# Patient Record
Sex: Female | Born: 1935 | Race: White | Hispanic: No | Marital: Married | Smoking: Never smoker
Health system: Southern US, Community
[De-identification: ages and names within clinical notes are randomized; demographics above are authoritative.]

## PROBLEM LIST (undated history)

## (undated) DIAGNOSIS — E119 Type 2 diabetes mellitus without complications: Secondary | ICD-10-CM

## (undated) DIAGNOSIS — I10 Essential (primary) hypertension: Secondary | ICD-10-CM

## (undated) DIAGNOSIS — M810 Age-related osteoporosis without current pathological fracture: Secondary | ICD-10-CM

## (undated) DIAGNOSIS — S62109A Fracture of unspecified carpal bone, unspecified wrist, initial encounter for closed fracture: Secondary | ICD-10-CM

## (undated) DIAGNOSIS — R269 Unspecified abnormalities of gait and mobility: Secondary | ICD-10-CM

## (undated) DIAGNOSIS — C50919 Malignant neoplasm of unspecified site of unspecified female breast: Secondary | ICD-10-CM

## (undated) DIAGNOSIS — G459 Transient cerebral ischemic attack, unspecified: Secondary | ICD-10-CM

## (undated) DIAGNOSIS — F329 Major depressive disorder, single episode, unspecified: Secondary | ICD-10-CM

## (undated) DIAGNOSIS — Z975 Presence of (intrauterine) contraceptive device: Secondary | ICD-10-CM

## (undated) DIAGNOSIS — F0390 Unspecified dementia without behavioral disturbance: Secondary | ICD-10-CM

## (undated) DIAGNOSIS — R413 Other amnesia: Principal | ICD-10-CM

## (undated) DIAGNOSIS — I4891 Unspecified atrial fibrillation: Secondary | ICD-10-CM

## (undated) DIAGNOSIS — E7800 Pure hypercholesterolemia, unspecified: Secondary | ICD-10-CM

## (undated) DIAGNOSIS — F32A Depression, unspecified: Secondary | ICD-10-CM

## (undated) HISTORY — DX: Fracture of unspecified carpal bone, unspecified wrist, initial encounter for closed fracture: S62.109A

## (undated) HISTORY — DX: Essential (primary) hypertension: I10

## (undated) HISTORY — DX: Type 2 diabetes mellitus without complications: E11.9

## (undated) HISTORY — DX: Other amnesia: R41.3

## (undated) HISTORY — DX: Major depressive disorder, single episode, unspecified: F32.9

## (undated) HISTORY — DX: Depression, unspecified: F32.A

## (undated) HISTORY — DX: Unspecified abnormalities of gait and mobility: R26.9

## (undated) HISTORY — DX: Pure hypercholesterolemia, unspecified: E78.00

## (undated) HISTORY — DX: Presence of (intrauterine) contraceptive device: Z97.5

## (undated) HISTORY — DX: Age-related osteoporosis without current pathological fracture: M81.0

## (undated) HISTORY — DX: Malignant neoplasm of unspecified site of unspecified female breast: C50.919

## (undated) HISTORY — PX: MASTECTOMY MODIFIED RADICAL: SUR848

---

## 1978-02-20 DIAGNOSIS — C50919 Malignant neoplasm of unspecified site of unspecified female breast: Secondary | ICD-10-CM

## 1978-02-20 HISTORY — DX: Malignant neoplasm of unspecified site of unspecified female breast: C50.919

## 2001-06-03 ENCOUNTER — Encounter: Payer: Self-pay | Admitting: Family Medicine

## 2001-06-03 ENCOUNTER — Encounter: Admission: RE | Admit: 2001-06-03 | Discharge: 2001-06-03 | Payer: Self-pay | Admitting: Family Medicine

## 2004-04-13 ENCOUNTER — Encounter: Admission: RE | Admit: 2004-04-13 | Discharge: 2004-04-13 | Payer: Self-pay | Admitting: Family Medicine

## 2011-05-22 DIAGNOSIS — S62109A Fracture of unspecified carpal bone, unspecified wrist, initial encounter for closed fracture: Secondary | ICD-10-CM

## 2011-05-22 HISTORY — DX: Fracture of unspecified carpal bone, unspecified wrist, initial encounter for closed fracture: S62.109A

## 2011-06-16 ENCOUNTER — Ambulatory Visit
Admission: RE | Admit: 2011-06-16 | Discharge: 2011-06-16 | Disposition: A | Discharge status: Home / Self Care | Payer: Medicare Other | Source: Ambulatory Visit | Attending: Family Medicine | Admitting: Family Medicine

## 2011-06-16 ENCOUNTER — Other Ambulatory Visit: Payer: Self-pay | Admitting: Family Medicine

## 2011-06-16 DIAGNOSIS — S6990XA Unspecified injury of unspecified wrist, hand and finger(s), initial encounter: Secondary | ICD-10-CM

## 2013-04-11 ENCOUNTER — Other Ambulatory Visit: Payer: Self-pay | Admitting: Family Medicine

## 2013-04-11 DIAGNOSIS — R413 Other amnesia: Secondary | ICD-10-CM

## 2013-04-20 ENCOUNTER — Ambulatory Visit
Admission: RE | Admit: 2013-04-20 | Discharge: 2013-04-20 | Disposition: A | Discharge status: Home / Self Care | Payer: 59 | Source: Ambulatory Visit | Attending: Family Medicine | Admitting: Family Medicine

## 2013-04-20 DIAGNOSIS — R413 Other amnesia: Secondary | ICD-10-CM

## 2013-04-20 MED ORDER — GADOBENATE DIMEGLUMINE 529 MG/ML IV SOLN
11.0000 mL | Freq: Once | INTRAVENOUS | Status: AC | PRN
  Administered 2013-04-20: 11 mL via INTRAVENOUS

## 2013-05-14 ENCOUNTER — Encounter: Payer: Self-pay | Admitting: Neurology

## 2013-05-15 ENCOUNTER — Ambulatory Visit (INDEPENDENT_AMBULATORY_CARE_PROVIDER_SITE_OTHER): Payer: Medicare Other | Admitting: Neurology

## 2013-05-15 ENCOUNTER — Encounter: Payer: Self-pay | Admitting: Neurology

## 2013-05-15 ENCOUNTER — Encounter (INDEPENDENT_AMBULATORY_CARE_PROVIDER_SITE_OTHER): Payer: Self-pay

## 2013-05-15 VITALS — BP 143/74 | HR 116 | Ht 63.5 in | Wt 116.0 lb

## 2013-05-15 DIAGNOSIS — E0590 Thyrotoxicosis, unspecified without thyrotoxic crisis or storm: Secondary | ICD-10-CM

## 2013-05-15 DIAGNOSIS — R634 Abnormal weight loss: Secondary | ICD-10-CM

## 2013-05-15 DIAGNOSIS — R269 Unspecified abnormalities of gait and mobility: Secondary | ICD-10-CM

## 2013-05-15 DIAGNOSIS — R413 Other amnesia: Secondary | ICD-10-CM

## 2013-05-15 HISTORY — DX: Other amnesia: R41.3

## 2013-05-15 HISTORY — DX: Unspecified abnormalities of gait and mobility: R26.9

## 2013-05-15 NOTE — Progress Notes (Signed)
Reason for visit: Memory disorder  Brittney Powell is a 80 y.o. female  History of present illness:  Brittney Powell is an 80 year old right-handed white female with a history of a progressive memory disorder. The patient's husband indicates that there were no indications of any memory problems whatsoever until the summer of 2014. Particularly since September 2014, the patient has had a significant progression in her memory. The patient has difficulty with short-term memory, and remembering names for people. The patient does not operate a motor vehicle, and she has not driven in 2 or 3 years, but not secondary to memory problems. The patient does not do the finances. The patient has not been cooking, mainly because of a wrist fracture on the left. The patient however, has also concurrently developed problems with balance, and falls. The patient has had weight loss, losing about 78 pounds of weight since the summer of 2015. The patient is sleeping fairly well, and she denies any headaches or focal numbness or weakness of the extremities. The patient has some occasional urinary incontinence. The patient has been placed on Aricept within the last 3 weeks, and she is just now going on Namenda. Blood work has been obtained that includes a vitamin B12 level, and a TSH is in the low normal range. The patient has had MRI evaluation of the brain that shows a mild to moderate level of small vessel ischemic changes that are chronic. Some generalized atrophy is seen.  Past Medical History  Diagnosis Date  . DM (diabetes mellitus)   . HTN (hypertension)   . Hypercholesterolemia   . Depression   . Breast cancer 1980    right  . Intrauterine pessary   . Wrist fracture 05/2011  . Osteoporosis   . Memory difficulty 05/15/2013  . Abnormality of gait 05/15/2013    Past Surgical History  Procedure Laterality Date  . Mastectomy modified radical Right     30 years ago    Family History  Problem Relation Age of  Onset  . Diabetes Father   . Cancer Father     leukemia  . Heart attack Brother   . Dementia Maternal Aunt     Social history:  reports that she has never smoked. She has never used smokeless tobacco. She reports that she drinks alcohol. She reports that she does not use illicit drugs.  Medications:  Current Outpatient Prescriptions on File Prior to Visit  Medication Sig Dispense Refill  . aspirin EC 81 MG tablet Take 81 mg by mouth daily.      [name] glimepiride (AMARYL) 4 MG tablet Take 4 mg by mouth daily with breakfast.      . lisinopril (PRINIVIL,ZESTRIL) 5 MG tablet Take 5 mg by mouth daily.      . Multiple Vitamins-Minerals (CENTRUM SILVER PO) Take 1 capsule by mouth 3 x daily with food.      [name] PARoxetine (PAXIL) 20 MG tablet Take 20 mg by mouth daily.      [name] atorvastatin (LIPITOR) 40 MG tablet Take 40 mg by mouth daily.      [name] ezetimibe (ZETIA) 10 MG tablet Take 10 mg by mouth daily.      [name] oxybutynin (DITROPAN) 5 MG tablet Take 5 mg by mouth 2 (two) times daily.       No current facility-administered medications on file prior to visit.      Allergies  Allergen Reactions  . Penicillins     ROS:  Out of a complete  14 system review of symptoms, the patient complains only of the following symptoms, and all other reviewed systems are negative.  Weight loss, fatigue Skin rash Urination problems Memory loss, confusion, weakness Anxiety, insomnia, decreased energy, change in appetite, disinterest in activities, hallucinations  Blood pressure 143/74, pulse 116, height 5' 3.5" (1.613 m), weight 116 lb (52.617 kg).  Physical Exam  General: The patient is alert and cooperative at the time of the examination.  Eyes: Pupils are equal, round, and reactive to light. Discs are flat bilaterally.  Neck: The neck is supple, no carotid bruits are noted.  Respiratory: The respiratory examination is clear.  Cardiovascular: The cardiovascular examination reveals an irregularly  irregular heart rhythm, no obvious murmurs or rubs are noted.  Skin: Extremities are without significant edema. There is a deformity involving the left wrist.  Neurologic Exam  Mental status: Mini-Mental status examination done today shows a total score of 19/30.  Cranial nerves: Facial symmetry is present. There is good sensation of the face to pinprick and soft touch bilaterally. The strength of the facial muscles and the muscles to head turning and shoulder shrug are normal bilaterally. Speech is well enunciated, no aphasia or dysarthria is noted. Extraocular movements are full. Visual fields are full. The tongue is midline, and the patient has symmetric elevation of the soft palate. No obvious hearing deficits are noted.  Motor: The motor testing reveals 5 over 5 strength of all 4 extremities. Good symmetric motor tone is noted throughout.  Sensory: Sensory testing is intact to pinprick, soft touch, vibration sensation, and position sense on all 4 extremities, with the exception that there is a significant impairment in vibration and position sense in both feet. No evidence of extinction is noted.  Coordination: Cerebellar testing reveals good finger-nose-finger and heel-to-shin bilaterally.  Gait and station: Gait is slightly wide-based. Tandem gait is poor. Romberg is negative, but is unsteady. No drift is seen.  Reflexes: Deep tendon reflexes are symmetric and normal bilaterally, with the exception that the ankle jerk reflexes are depressed bilaterally. Toes are downgoing bilaterally.   MRI brain February 20th 2015:   IMPRESSION:  1. Moderate generalized atrophy demonstrates a slight prominence in  the temporal and parietal lobes. This is nonspecific, but can be  seen in the setting of Alzheimer's dementia.  2. No acute intracranial abnormality.  3. Moderate periventricular and subcortical white matter disease  bilaterally. This is nonspecific, but likely reflects the sequela of    chronic microvascular ischemia.   Assessment/Plan:  One. Progressive memory disturbance  2. Progressive gait disturbance  3. Weight loss  The patient has had an issue with a fairly rapid change in memory associated with balance issues and weight loss. The history suggests that there is a process at work that is likely not Alzheimer's disease. The patient will need to be evaluated for other issues such as a thyroid encephalopathy or a paraneoplastic encephalopathy. The patient will undergo further blood work. If the blood studies are unremarkable, I would recommend lumbar puncture evaluation. The patient will followup in 3 or 4 months, but I will be contacting the patient concerning the blood work when the studies are available. Clinical examination today shows an irregularly irregular heartbeat that could represent atrial fibrillation. The patient may require further evaluation for this.  Jill Alexanders MD 05/15/2013 7:50 PM  [facility] [address] [place], [place] [address]  Phone [phone] Fax [phone]

## 2013-05-15 NOTE — Patient Instructions (Signed)

## 2013-05-21 ENCOUNTER — Telehealth: Payer: Self-pay | Admitting: Neurology

## 2013-05-21 LAB — HIV ANTIBODY (ROUTINE TESTING W REFLEX): HIV 1/HIV 2 AB: NONREACTIVE

## 2013-05-21 LAB — THYROGLOBULIN ANTIBODY: Thyroglobulin Ab: <1 IU/mL (ref 0.0–0.9)

## 2013-05-21 LAB — PARANEOPLASTIC PROFILE 1

## 2013-05-21 LAB — COPPER, SERUM: Copper: 110 ug/dL (ref 72–166)

## 2013-05-21 LAB — THYROID PEROXIDASE ANTIBODY: Thyroid Peroxidase Ab: <6 IU/mL (ref 0–34)

## 2013-05-21 LAB — T3, FREE: T3 FREE: 3 pg/mL (ref 2.0–4.4)

## 2013-05-21 LAB — T4, FREE: Free T4: 0.93 ng/dL (ref 0.82–1.77)

## 2013-05-21 LAB — RPR: RPR: NONREACTIVE

## 2013-05-21 LAB — SEDIMENTATION RATE: SED RATE: 6 mm/h (ref 0–40)

## 2013-05-21 LAB — ANA W/REFLEX: Anti Nuclear Antibody(ANA): NEGATIVE

## 2013-05-21 NOTE — Telephone Encounter (Signed)
I called the patient, I talked with her husband. The blood work was completely normal. As we discussed in the office, I would recommend pursuing a lumbar puncture to look for an inflammatory process within the spinal fluid, exclude neoplasm. The husband will discuss this issue with the patient, and they'll let he know whether they want to do the spinal tap.

## 2013-06-04 ENCOUNTER — Telehealth: Payer: Self-pay | Admitting: Neurology

## 2013-06-04 NOTE — Telephone Encounter (Signed)
Patient's husband walked into the lobby stating that the patient saw Dr. Brigitte Pulse recently who wants the patient to return to Dr. Jannifer Franklin for medications (Aricept and Namenda). Husband also has questions regarding stopping Zetia and Lipitor. Butch Penny is aware and will call him back at   [phone]

## 2013-06-05 MED ORDER — MEMANTINE HCL ER 28 MG PO CP24: 28.0000 mg | ORAL_CAPSULE | Freq: Every day | ORAL | Status: DC

## 2013-06-05 MED ORDER — DONEPEZIL HCL 5 MG PO TABS: 5.0000 mg | ORAL_TABLET | Freq: Every day | ORAL | Status: DC

## 2013-06-05 NOTE — Telephone Encounter (Signed)
Called pt and spoke with pt's husband [name] concerning the pt's medication. Husband states that the pt needs refills on Aricept and Namenda, states that pt is on her last week of Namenda. Husband also states that Dr. Brigitte Pulse stopped the pt from taking Zetia and Lipitor, because the pt was levels were fine and husband wanted to know if Dr. Jannifer Franklin thought the pt should continue to take them. Husband would like Dr. Jannifer Franklin to give him a call back please. Thanks

## 2013-06-05 NOTE — Telephone Encounter (Signed)
I called the patient and I talked with her husband. The patient is stabilized with her weight, and her walking seems a bit that her. The husband does not wish to pursue a lumbar puncture this time. I will call in a prescription for the Namenda, would keep Aricept at 5 mg daily until it is definite that the weight is not continuing to decline. The patient was taken off of Lipitor and Zetia. Stay off the Lipitor is reasonable, I have no problem with going back on Zetia.

## 2013-06-25 ENCOUNTER — Telehealth: Payer: Self-pay | Admitting: Neurology

## 2013-06-25 NOTE — Telephone Encounter (Signed)
Patient's husband calling to ask more information about lumbar puncture that was discussed for patient. Please call and advise.   

## 2013-06-26 NOTE — Telephone Encounter (Signed)
I called the patient, talked with her husband. I went over the potential indications for the lumbar puncture. The husband and his wife will try to decide about this procedure in the near future, and let me know. I'll be happy to set it up if they desire. The husband indicates that his wife has very low energy levels, unable to do much.

## 2013-06-26 NOTE — Telephone Encounter (Signed)
Patient's husband calling to ask more information about lumbar puncture that was discussed for patient. Please call and advise.

## 2013-06-30 ENCOUNTER — Telehealth: Payer: Self-pay | Admitting: *Deleted

## 2013-06-30 DIAGNOSIS — R269 Unspecified abnormalities of gait and mobility: Secondary | ICD-10-CM

## 2013-06-30 DIAGNOSIS — R413 Other amnesia: Secondary | ICD-10-CM

## 2013-07-02 NOTE — Telephone Encounter (Signed)
I called the husband. He now agrees to have a lumbar puncture done, I'll try to get this set up.

## 2013-07-02 NOTE — Telephone Encounter (Signed)
Called pt and spoke with pt's husband [name] and he stated that the pt would like to go ahead and have a LP done, based on phone note on 06/25/13. Please advise

## 2013-07-04 ENCOUNTER — Telehealth: Payer: Self-pay | Admitting: Neurology

## 2013-07-04 NOTE — Telephone Encounter (Signed)
Pt's husband [name] called back asking who would be calling him back concerning the schedule for LP, Spoke with Amiee and found where it was ordered and that GI would be calling pt to schedule this apt. Called [name] back and advised this order was placed and that GI will be calling to schedule the LP. Thanks

## 2013-07-22 ENCOUNTER — Telehealth: Payer: Self-pay | Admitting: Neurology

## 2013-07-22 NOTE — Telephone Encounter (Signed)
Patient's husband called stating that he has called [facility] and they have not received any orders for his wife.. Patient's husband would like a call back regarding this. Please call to advise.

## 2013-07-22 NOTE — Telephone Encounter (Signed)
Informed patient that Brittney Powell [facility]) had been contacted and she will be calling him to schedule appt, he verbalized understanding.

## 2013-07-23 ENCOUNTER — Other Ambulatory Visit: Payer: Self-pay | Admitting: Neurology

## 2013-07-23 DIAGNOSIS — R413 Other amnesia: Secondary | ICD-10-CM

## 2013-07-28 ENCOUNTER — Ambulatory Visit
Admission: RE | Admit: 2013-07-28 | Discharge: 2013-07-28 | Disposition: A | Discharge status: Home / Self Care | Payer: 59 | Source: Ambulatory Visit | Attending: Neurology | Admitting: Neurology

## 2013-07-28 ENCOUNTER — Other Ambulatory Visit (HOSPITAL_COMMUNITY)
Admission: RE | Admit: 2013-07-28 | Discharge: 2013-07-28 | Disposition: A | Discharge status: Home / Self Care | Payer: 59 | Source: Ambulatory Visit | Attending: Neurology | Admitting: Neurology

## 2013-07-28 ENCOUNTER — Other Ambulatory Visit: Payer: Self-pay | Admitting: Neurology

## 2013-07-28 VITALS — BP 145/101 | HR 118

## 2013-07-28 DIAGNOSIS — R634 Abnormal weight loss: Secondary | ICD-10-CM | POA: Insufficient documentation

## 2013-07-28 DIAGNOSIS — R269 Unspecified abnormalities of gait and mobility: Secondary | ICD-10-CM

## 2013-07-28 DIAGNOSIS — R413 Other amnesia: Secondary | ICD-10-CM

## 2013-07-28 LAB — CRYPTOCOCCAL ANTIGEN, CSF: Crypto Ag: NEGATIVE

## 2013-07-28 NOTE — Discharge Instructions (Signed)

## 2013-07-28 NOTE — Progress Notes (Signed)
Blood drawn from right AC to go with spinal fluid. Site is unremarkable and pt tolerated well. Discharge explained to pt and her family.

## 2013-07-30 LAB — CSF PANEL 1
GLUCOSE CSF: 76 mg/dL (ref 43–76)
RBC Count, CSF: 0 cu mm
Total Protein, CSF: 56 mg/dL — ABNORMAL HIGH (ref 15–45)
Tube #: 3
VDRL Quant, CSF: NONREACTIVE
WBC, CSF: 1 cu mm (ref 0–5)

## 2013-08-07 LAB — B. BURGDORFI ANTIBODIES, CSF: Lyme Ab: NEGATIVE

## 2013-08-08 ENCOUNTER — Telehealth: Payer: Self-pay | Admitting: Neurology

## 2013-08-08 NOTE — Telephone Encounter (Signed)
Spouse calling to get lumbar puncture results.  Please call and advise.

## 2013-08-08 NOTE — Telephone Encounter (Signed)
Informed patient that results have not be reviewed by physician, will call back once reviewed, he verbalized understanding

## 2013-08-11 ENCOUNTER — Telehealth: Payer: Self-pay | Admitting: Neurology

## 2013-08-11 NOTE — Telephone Encounter (Signed)
I called patient, talked with the husband. The spinal fluid analysis so far is unremarkable with the exception that the protein level slightly elevated. The patient remained somewhat weak and unstable with walking. We will consider home health physical therapy if they are amenable to this, the husband will contact our office to let him know.

## 2013-09-16 ENCOUNTER — Ambulatory Visit (INDEPENDENT_AMBULATORY_CARE_PROVIDER_SITE_OTHER): Payer: Medicare Other | Admitting: Neurology

## 2013-09-16 ENCOUNTER — Encounter: Payer: Self-pay | Admitting: Neurology

## 2013-09-16 VITALS — BP 144/79 | HR 64 | Wt 127.0 lb

## 2013-09-16 DIAGNOSIS — R413 Other amnesia: Secondary | ICD-10-CM

## 2013-09-16 DIAGNOSIS — R269 Unspecified abnormalities of gait and mobility: Secondary | ICD-10-CM

## 2013-09-16 MED ORDER — DONEPEZIL HCL 10 MG PO TABS: 10.0000 mg | ORAL_TABLET | Freq: Every day | ORAL | Status: DC

## 2013-09-16 NOTE — Patient Instructions (Signed)

## 2013-09-16 NOTE — Progress Notes (Signed)
Reason for visit: Dementia  Brittney Powell is an 80 y.o. female  History of present illness:  Brittney Powell is an 80 year old right-handed white female with a history of onset of a relatively rapidly progressive dementia. The issue started in September 2014. The patient also had an associated problem with gait instability, but she has not fallen recently. The patient has been placed on Aricept and Namenda. She did have an associated problem with weight loss, but this seems to have stabilized, and the patient has gained 11 pounds since last seen. The patient has undergone blood work and spinal fluid evaluation without any significant abnormalities. The protein level in the spinal fluid was slightly elevated. Cytology was negative. A paraneoplastic antibody panel was negative. Since last seen, the husband indicates that the patient has had difficulty knowing how to operate a cell phone and how to operate a remote control for the TV. Bowel control seems to be unaffected. The patient is sleeping well at night. The patient has had transient skin rashes affecting the neck, face, and arm.   Past Medical History  Diagnosis Date  . DM (diabetes mellitus)   . HTN (hypertension)   . Hypercholesterolemia   . Depression   . Breast cancer 1980    right  . Intrauterine pessary   . Wrist fracture 05/2011  . Osteoporosis   . Memory difficulty 05/15/2013  . Abnormality of gait 05/15/2013    Past Surgical History  Procedure Laterality Date  . Mastectomy modified radical Right     30 years ago    Family History  Problem Relation Age of Onset  . Diabetes Father   . Cancer Father     leukemia  . Heart attack Brother   . Dementia Maternal Aunt     Social history:  reports that she has never smoked. She has never used smokeless tobacco. She reports that she drinks alcohol. She reports that she does not use illicit drugs.    Allergies  Allergen Reactions  . Penicillins     Medications:    Current Outpatient Prescriptions on File Prior to Visit  Medication Sig Dispense Refill  . aspirin EC 81 MG tablet Take 81 mg by mouth daily.      . Calcium-Magnesium-Zinc 167-83-8 MG TABS Take 1 tablet by mouth daily.      [name] ezetimibe (ZETIA) 10 MG tablet Take 10 mg by mouth daily.      [name] glimepiride (AMARYL) 4 MG tablet Take 4 mg by mouth daily with breakfast.      . lisinopril (PRINIVIL,ZESTRIL) 5 MG tablet Take 5 mg by mouth daily.      . Memantine HCl ER 28 MG CP24 Take 28 mg by mouth daily.  90 capsule  1  . Multiple Vitamins-Minerals (CENTRUM SILVER PO) Take 1 capsule by mouth 3 x daily with food.      [name] oxybutynin (DITROPAN) 5 MG tablet Take 5 mg by mouth 2 (two) times daily.      [name] PARoxetine (PAXIL) 20 MG tablet Take 20 mg by mouth daily.      . vitamin B-12 (CYANOCOBALAMIN) 1000 MCG tablet Take 1,000 mcg by mouth daily.       No current facility-administered medications on file prior to visit.    ROS:  Out of a complete 14 system review of symptoms, the patient complains only of the following symptoms, and all other reviewed systems are negative.  Incontinence of bladder Gait instability Memory loss Weakness Confusion, anxiety  Daytime sleepiness, sleep talking Skin rash  Blood pressure 144/79, pulse 64, weight 127 lb (57.607 kg).  Physical Exam  General: The patient is alert and cooperative at the time of the examination.  Skin: No significant peripheral edema is noted.   Neurologic Exam  Mental status: The Mini-Mental status examination done today shows a total score of 19/30.  Cranial nerves: Facial symmetry is present. Speech is normal, no aphasia or dysarthria is noted. Extraocular movements are full. Visual fields are full.  Motor: The patient has good strength in all 4 extremities.  Sensory examination: Soft touch sensation is symmetric on the face, arms, and legs.  Coordination: The patient has good finger-nose-finger and heel-to-shin bilaterally.  The patient has apraxia with the use of the lower extremities.  Gait and station: The patient has a slightly wide-based, unsteady gait. Tandem gait is unsteady. Romberg is negative , but is unsteady. No drift is seen.  Reflexes: Deep tendon reflexes are symmetric.     MRI brain 04/20/13:  IMPRESSION:  1. Moderate generalized atrophy demonstrates a slight prominence in  the temporal and parietal lobes. This is nonspecific, but can be  seen in the setting of Alzheimer's dementia.  2. No acute intracranial abnormality.  3. Moderate periventricular and subcortical white matter disease  bilaterally. This is nonspecific, but likely reflects the sequela of  chronic microvascular ischemia.    Assessment/Plan:  1. Memory disorder  2. Gait disorder  The patient had a workup that was unrevealing as to the etiology of the gait disorder and dementia. The patient will continue on the Aricept and Namenda. Her cognitive functioning level seems to have stabilized since last seen. The weight has returned, and the patient is eating well. She will be increased on Aricept taking 10 mg at night, and she will followup in about 5 to 6 months.   Jill Alexanders MD 09/16/2013 7:13 PM  [facility] [address] [place], [place] [address]  Phone [phone] Fax [phone]

## 2013-09-18 ENCOUNTER — Ambulatory Visit: Payer: Self-pay | Admitting: Neurology

## 2013-10-16 ENCOUNTER — Telehealth: Payer: Self-pay | Admitting: Neurology

## 2013-10-16 DIAGNOSIS — R269 Unspecified abnormalities of gait and mobility: Secondary | ICD-10-CM

## 2013-10-16 NOTE — Telephone Encounter (Signed)
I called the patient, talked with the husband. The patient has had ongoing issues with her walking, and she requires assistance for ambulation. I'll get home health physical therapy.

## 2013-10-16 NOTE — Telephone Encounter (Signed)
Spouse wants to proceed with in home PT.  Please call and advise anytime and may leave a detailed message on voice mail.

## 2013-10-16 NOTE — Telephone Encounter (Signed)
Spouse wants to proceed with in home PT. Please call and advise anytime and may leave a detailed message on voice mail.

## 2013-10-22 ENCOUNTER — Telehealth: Payer: Self-pay | Admitting: *Deleted

## 2013-10-22 NOTE — Telephone Encounter (Signed)
Called patient to see if [facility] had contacted them. [place] was actually in the home (presently).

## 2013-11-03 ENCOUNTER — Telehealth: Payer: Self-pay | Admitting: Neurology

## 2013-11-03 NOTE — Telephone Encounter (Signed)
Events noted, cannot force accept therapy.

## 2013-11-03 NOTE — Telephone Encounter (Signed)
Ilda Basset, Physical Therapist at [facility] @ [phone]. Wanted to make Dr. Jannifer Franklin aware that spouse has cancelled all home health therapy. Spouse stated pt gets very anxious and upset when someone comes into her home and make her do things she doesn't want to. FYI

## 2013-11-03 NOTE — Telephone Encounter (Signed)
Ilda Basset, Physical Therapist at [facility] @ [phone].  Wanted to make Dr. Jannifer Franklin aware that spouse has cancelled all home health therapy.  Spouse stated pt gets very anxious and upset when someone comes into her home and make her do things she doesn't want to.  FYI

## 2013-11-25 ENCOUNTER — Other Ambulatory Visit: Payer: Self-pay | Admitting: Neurology

## 2014-01-07 ENCOUNTER — Encounter: Payer: Self-pay | Admitting: Neurology

## 2014-01-13 ENCOUNTER — Encounter: Payer: Self-pay | Admitting: Neurology

## 2014-03-23 ENCOUNTER — Encounter: Payer: Self-pay | Admitting: Adult Health

## 2014-03-23 ENCOUNTER — Ambulatory Visit: Payer: Medicare Other | Admitting: Adult Health

## 2014-03-23 ENCOUNTER — Ambulatory Visit (INDEPENDENT_AMBULATORY_CARE_PROVIDER_SITE_OTHER): Payer: Medicare Other | Admitting: Adult Health

## 2014-03-23 VITALS — BP 128/84 | HR 67 | Ht 64.0 in | Wt 121.0 lb

## 2014-03-23 DIAGNOSIS — R269 Unspecified abnormalities of gait and mobility: Secondary | ICD-10-CM

## 2014-03-23 DIAGNOSIS — R413 Other amnesia: Secondary | ICD-10-CM

## 2014-03-23 NOTE — Progress Notes (Signed)
I have read the note, and I agree with the clinical assessment and plan.  Maeghan Canny KEITH   

## 2014-03-23 NOTE — Progress Notes (Signed)
PATIENT: Brittney Powell DOB: 1928-01-26  REASON FOR VISIT: follow up- memory loss, gait disorder HISTORY FROM: Husband  HISTORY OF PRESENT ILLNESS:  Brittney Powell is an 81 year old female with a history of rapidly progressive dementia. She returns today for follow-up. She is currently taking Aricept and Namenda. Patient also has a gait disorder- Physical therapy was recommended but people coming into the patient's home caused anxiety.  Denies any falls since the last visit but has had some stumbles. Husband states that her memory has gotten worse. She needs assistance with ADLS. She does not operate a motor vehicle. Husband does all the cooking and fiances. Patient will get very anxious and that will cause some agitation. Denies any aggression. Patient is sleeping well at night according to the husband. He does states that she may move her hands or reach for things while sleeping.  No new medical issues since last seen.   HISTORY 09/16/13 ([name]): Brittney Powell is an 81 year old right-handed white female with a history of onset of a relatively rapidly progressive dementia. The issue started in September 2014. The patient also had an associated problem with gait instability, but she has not fallen recently. The patient has been placed on Aricept and Namenda. She did have an associated problem with weight loss, but this seems to have stabilized, and the patient has gained 11 pounds since last seen. The patient has undergone blood work and spinal fluid evaluation without any significant abnormalities. The protein level in the spinal fluid was slightly elevated. Cytology was negative. A paraneoplastic antibody panel was negative. Since last seen, the husband indicates that the patient has had difficulty knowing how to operate a cell phone and how to operate a remote control for the TV. Bowel control seems to be unaffected. The patient is sleeping well at night. The patient has had transient skin rashes affecting  the neck, face, and arm.  REVIEW OF SYSTEMS: Out of a complete 14 system review of symptoms, the patient complains only of the following symptoms, and all other reviewed systems are negative.  Blurred vision, memory loss, dizziness, weakness, joint pain, back pain, walking difficulty, confusion, nervous/anxious, daytime sleepiness, snoring, sleep talking, acting out dreams.  ALLERGIES: Allergies  Allergen Reactions  . Penicillins     HOME MEDICATIONS: Outpatient Prescriptions Prior to Visit  Medication Sig Dispense Refill  . aspirin EC 81 MG tablet Take 81 mg by mouth daily.    . Calcium-Magnesium-Zinc 167-83-8 MG TABS Take 1 tablet by mouth daily.    [name] donepezil (ARICEPT) 10 MG tablet Take 1 tablet (10 mg total) by mouth at bedtime. 90 tablet 3  . ezetimibe (ZETIA) 10 MG tablet Take 10 mg by mouth daily.    [name] glimepiride (AMARYL) 4 MG tablet Take 4 mg by mouth daily with breakfast.    . lisinopril (PRINIVIL,ZESTRIL) 5 MG tablet Take 5 mg by mouth daily.    [name] NAMENDA XR 28 MG CP24 TAKE 1 CAPSULE DAILY 90 capsule 1  . oxybutynin (DITROPAN) 5 MG tablet Take 5 mg by mouth 2 (two) times daily.    [name] PARoxetine (PAXIL) 20 MG tablet Take 20 mg by mouth daily.    . vitamin B-12 (CYANOCOBALAMIN) 1000 MCG tablet Take 1,000 mcg by mouth daily.    . Multiple Vitamins-Minerals (CENTRUM SILVER PO) Take 1 capsule by mouth 3 x daily with food.    [name] MYRBETRIQ 25 MG TB24 tablet Take 25 mg by mouth daily.     No  facility-administered medications prior to visit.    PAST MEDICAL HISTORY: Past Medical History  Diagnosis Date  . DM (diabetes mellitus)   . HTN (hypertension)   . Hypercholesterolemia   . Depression   . Breast cancer 1980    right  . Intrauterine pessary   . Wrist fracture 05/2011  . Osteoporosis   . Memory difficulty 05/15/2013  . Abnormality of gait 05/15/2013    PAST SURGICAL HISTORY: Past Surgical History  Procedure Laterality Date  . Mastectomy modified radical Right      30 years ago    FAMILY HISTORY: Family History  Problem Relation Age of Onset  . Diabetes Father   . Cancer Father     leukemia  . Heart attack Brother   . Dementia Maternal Aunt     PHYSICAL EXAM  Filed Vitals:   03/23/14 1122  BP: 128/84  Pulse: 67  Height: 5\' 4"  (1.626 m)  Weight: 121 lb (54.885 kg)   Body mass index is 20.76 kg/(m^2).  Generalized: Well developed, in no acute distress   Neurological examination  Mentation: Alert. Follows all commands speech and language fluent. MMSE 16/30 Cranial nerve II-XII: Pupils were equal round reactive to light. Extraocular movements were full, visual field were full on confrontational test. Facial sensation and strength were normal. Uvula tongue midline. Head turning and shoulder shrug  were normal and symmetric. Motor: The motor testing reveals 5 over 5 strength of all 4 extremities. Good symmetric motor tone is noted throughout.  Sensory: Sensory testing is intact to soft touch on all 4 extremities. No evidence of extinction is noted.  Coordination: Cerebellar testing reveals mild difficulty with finger-nose-finger and heel-to-shin bilaterally.  Gait and station: Gait is normal. Tandem gait is normal. Romberg is negative. No drift is seen.  Reflexes: Deep tendon reflexes are symmetric and normal bilaterally.  [name]   DIAGNOSTIC DATA (LABS, IMAGING, TESTING) - I reviewed patient records, labs, notes, testing and imaging myself where available.     ASSESSMENT AND PLAN 81 y.o. year old female  has a past medical history of DM (diabetes mellitus); HTN (hypertension); Hypercholesterolemia; Depression; Breast cancer (1980); Intrauterine pessary; Wrist fracture (05/2011); Osteoporosis; Memory difficulty (05/15/2013); and Abnormality of gait (05/15/2013). here with:  1. Memory loss 2. Gait disorder  Patient's memory score has decreased since the last visit. Her MMSE today is 16/30 was previously 19/30. She will continue Aricept and  Namenda. No refills needed today. I have encouraged the patient to use a walker when ambulating. She has refused physical therapy in the past. She will follow-up in 6 months or sooner if needed.   Ward Givens, MSN, NP-C 03/23/2014, 11:49 AM [facility] [address] [place], [place] [address] [phone]  Note: This document was prepared with digital dictation and possible smart phrase technology. Any transcriptional errors that result from this process are unintentional.

## 2014-03-23 NOTE — Patient Instructions (Signed)
Continue Namenda and Aricept. Please use a walker when ambulating.  If your symptoms worsen or you develop new symptoms please let us know.

## 2014-05-12 ENCOUNTER — Other Ambulatory Visit: Payer: Self-pay

## 2014-05-12 MED ORDER — MEMANTINE HCL ER 28 MG PO CP24: 28.0000 mg | ORAL_CAPSULE | Freq: Every day | ORAL | Status: AC

## 2014-08-25 ENCOUNTER — Encounter (HOSPITAL_COMMUNITY): Payer: Self-pay

## 2014-08-25 ENCOUNTER — Emergency Department (HOSPITAL_COMMUNITY): Payer: Medicare Other

## 2014-08-25 ENCOUNTER — Other Ambulatory Visit (HOSPITAL_COMMUNITY): Payer: Medicare Other

## 2014-08-25 ENCOUNTER — Inpatient Hospital Stay (HOSPITAL_COMMUNITY)
Admission: EM | Admit: 2014-08-25 | Discharge: 2014-08-30 | DRG: 480 | Disposition: A | Discharge status: Skilled Nursing Facility | Payer: Medicare Other | Attending: Internal Medicine | Admitting: Internal Medicine

## 2014-08-25 ENCOUNTER — Inpatient Hospital Stay (HOSPITAL_COMMUNITY): Payer: Medicare Other

## 2014-08-25 DIAGNOSIS — Z79899 Other long term (current) drug therapy: Secondary | ICD-10-CM

## 2014-08-25 DIAGNOSIS — E1165 Type 2 diabetes mellitus with hyperglycemia: Secondary | ICD-10-CM | POA: Diagnosis present

## 2014-08-25 DIAGNOSIS — Z853 Personal history of malignant neoplasm of breast: Secondary | ICD-10-CM

## 2014-08-25 DIAGNOSIS — I10 Essential (primary) hypertension: Secondary | ICD-10-CM | POA: Diagnosis present

## 2014-08-25 DIAGNOSIS — J9601 Acute respiratory failure with hypoxia: Secondary | ICD-10-CM | POA: Diagnosis not present

## 2014-08-25 DIAGNOSIS — R791 Abnormal coagulation profile: Secondary | ICD-10-CM | POA: Diagnosis not present

## 2014-08-25 DIAGNOSIS — F329 Major depressive disorder, single episode, unspecified: Secondary | ICD-10-CM | POA: Diagnosis present

## 2014-08-25 DIAGNOSIS — M810 Age-related osteoporosis without current pathological fracture: Secondary | ICD-10-CM | POA: Diagnosis present

## 2014-08-25 DIAGNOSIS — R3915 Urgency of urination: Secondary | ICD-10-CM | POA: Diagnosis present

## 2014-08-25 DIAGNOSIS — F0390 Unspecified dementia without behavioral disturbance: Secondary | ICD-10-CM | POA: Diagnosis present

## 2014-08-25 DIAGNOSIS — Z8249 Family history of ischemic heart disease and other diseases of the circulatory system: Secondary | ICD-10-CM

## 2014-08-25 DIAGNOSIS — E041 Nontoxic single thyroid nodule: Secondary | ICD-10-CM | POA: Diagnosis present

## 2014-08-25 DIAGNOSIS — K449 Diaphragmatic hernia without obstruction or gangrene: Secondary | ICD-10-CM | POA: Diagnosis present

## 2014-08-25 DIAGNOSIS — Z833 Family history of diabetes mellitus: Secondary | ICD-10-CM

## 2014-08-25 DIAGNOSIS — D62 Acute posthemorrhagic anemia: Secondary | ICD-10-CM | POA: Diagnosis not present

## 2014-08-25 DIAGNOSIS — E049 Nontoxic goiter, unspecified: Secondary | ICD-10-CM

## 2014-08-25 DIAGNOSIS — E785 Hyperlipidemia, unspecified: Secondary | ICD-10-CM | POA: Diagnosis present

## 2014-08-25 DIAGNOSIS — R54 Age-related physical debility: Secondary | ICD-10-CM | POA: Diagnosis present

## 2014-08-25 DIAGNOSIS — W19XXXA Unspecified fall, initial encounter: Secondary | ICD-10-CM | POA: Diagnosis present

## 2014-08-25 DIAGNOSIS — Z880 Allergy status to penicillin: Secondary | ICD-10-CM | POA: Diagnosis not present

## 2014-08-25 DIAGNOSIS — S72142A Displaced intertrochanteric fracture of left femur, initial encounter for closed fracture: Secondary | ICD-10-CM | POA: Diagnosis present

## 2014-08-25 DIAGNOSIS — R2681 Unsteadiness on feet: Secondary | ICD-10-CM | POA: Diagnosis present

## 2014-08-25 DIAGNOSIS — S7292XA Unspecified fracture of left femur, initial encounter for closed fracture: Secondary | ICD-10-CM

## 2014-08-25 DIAGNOSIS — Z7982 Long term (current) use of aspirin: Secondary | ICD-10-CM | POA: Diagnosis not present

## 2014-08-25 DIAGNOSIS — S72009A Fracture of unspecified part of neck of unspecified femur, initial encounter for closed fracture: Secondary | ICD-10-CM

## 2014-08-25 DIAGNOSIS — W1830XA Fall on same level, unspecified, initial encounter: Secondary | ICD-10-CM | POA: Diagnosis present

## 2014-08-25 DIAGNOSIS — S72002A Fracture of unspecified part of neck of left femur, initial encounter for closed fracture: Secondary | ICD-10-CM | POA: Diagnosis present

## 2014-08-25 DIAGNOSIS — Z806 Family history of leukemia: Secondary | ICD-10-CM | POA: Diagnosis not present

## 2014-08-25 DIAGNOSIS — R7989 Other specified abnormal findings of blood chemistry: Secondary | ICD-10-CM | POA: Diagnosis present

## 2014-08-25 DIAGNOSIS — Z8673 Personal history of transient ischemic attack (TIA), and cerebral infarction without residual deficits: Secondary | ICD-10-CM

## 2014-08-25 DIAGNOSIS — I4891 Unspecified atrial fibrillation: Secondary | ICD-10-CM | POA: Diagnosis present

## 2014-08-25 DIAGNOSIS — E040 Nontoxic diffuse goiter: Secondary | ICD-10-CM | POA: Insufficient documentation

## 2014-08-25 DIAGNOSIS — IMO0002 Reserved for concepts with insufficient information to code with codable children: Secondary | ICD-10-CM | POA: Diagnosis present

## 2014-08-25 DIAGNOSIS — Z01818 Encounter for other preprocedural examination: Secondary | ICD-10-CM

## 2014-08-25 HISTORY — DX: Unspecified dementia, unspecified severity, without behavioral disturbance, psychotic disturbance, mood disturbance, and anxiety: F03.90

## 2014-08-25 HISTORY — DX: Transient cerebral ischemic attack, unspecified: G45.9

## 2014-08-25 LAB — CBC WITH DIFFERENTIAL/PLATELET
Basophils Absolute: 0 K/uL (ref 0.0–0.1)
Basophils Relative: 0 % (ref 0–1)
Eosinophils Absolute: 0.1 K/uL (ref 0.0–0.7)
Eosinophils Relative: 1 % (ref 0–5)
HCT: 36.4 % (ref 36.0–46.0)
Hemoglobin: 12.5 g/dL (ref 12.0–15.0)
Lymphocytes Relative: 37 % (ref 12–46)
Lymphs Abs: 5.1 K/uL — ABNORMAL HIGH (ref 0.7–4.0)
MCH: 32.7 pg (ref 26.0–34.0)
MCHC: 34.3 g/dL (ref 30.0–36.0)
MCV: 95.3 fL (ref 78.0–100.0)
Monocytes Absolute: 0.4 K/uL (ref 0.1–1.0)
Monocytes Relative: 3 % (ref 3–12)
Neutro Abs: 8.2 K/uL — ABNORMAL HIGH (ref 1.7–7.7)
Neutrophils Relative %: 59 % (ref 43–77)
Platelets: 239 K/uL (ref 150–400)
RBC: 3.82 MIL/uL — ABNORMAL LOW (ref 3.87–5.11)
RDW: 12.5 % (ref 11.5–15.5)
WBC: 13.8 K/uL — ABNORMAL HIGH (ref 4.0–10.5)

## 2014-08-25 LAB — URINALYSIS, ROUTINE W REFLEX MICROSCOPIC
Bilirubin Urine: NEGATIVE
Glucose, UA: >1000 mg/dL — AB
KETONES UR: 40 mg/dL — AB
NITRITE: NEGATIVE
Protein, ur: NEGATIVE mg/dL
Urobilinogen, UA: 0.2 mg/dL (ref 0.0–1.0)
pH: 5 (ref 5.0–8.0)

## 2014-08-25 LAB — BASIC METABOLIC PANEL WITH GFR
Anion gap: 9 (ref 5–15)
BUN: 18 mg/dL (ref 6–20)
CO2: 26 mmol/L (ref 22–32)
Calcium: 8.8 mg/dL — ABNORMAL LOW (ref 8.9–10.3)
Chloride: 102 mmol/L (ref 101–111)
Creatinine, Ser: 0.77 mg/dL (ref 0.44–1.00)
GFR calc Af Amer: >60 mL/min
GFR calc non Af Amer: >60 mL/min
Glucose, Bld: 244 mg/dL — ABNORMAL HIGH (ref 65–99)
Potassium: 4.1 mmol/L (ref 3.5–5.1)
Sodium: 137 mmol/L (ref 135–145)

## 2014-08-25 LAB — TYPE AND SCREEN
ABO/RH(D): A POS
Antibody Screen: NEGATIVE

## 2014-08-25 LAB — GLUCOSE, CAPILLARY
GLUCOSE-CAPILLARY: 136 mg/dL — AB (ref 65–99)
Glucose-Capillary: 263 mg/dL — ABNORMAL HIGH (ref 65–99)
Glucose-Capillary: 241 mg/dL — ABNORMAL HIGH (ref 65–99)

## 2014-08-25 LAB — CALCIUM: CALCIUM: 8.8 mg/dL — AB (ref 8.9–10.3)

## 2014-08-25 LAB — URINE MICROSCOPIC-ADD ON

## 2014-08-25 LAB — CBG MONITORING, ED: Glucose-Capillary: 220 mg/dL — ABNORMAL HIGH (ref 65–99)

## 2014-08-25 LAB — D-DIMER, QUANTITATIVE (NOT AT ARMC): D DIMER QUANT: 14.76 ug{FEU}/mL — AB (ref 0.00–0.48)

## 2014-08-25 LAB — TSH
TSH: 0.545 u[IU]/mL (ref 0.350–4.500)
TSH: 0.457 u[IU]/mL (ref 0.350–4.500)

## 2014-08-25 LAB — ALBUMIN: ALBUMIN: 3.5 g/dL (ref 3.5–5.0)

## 2014-08-25 LAB — I-STAT TROPONIN, ED: TROPONIN I, POC: 0.01 ng/mL (ref 0.00–0.08)

## 2014-08-25 LAB — ABO/RH: ABO/RH(D): A POS

## 2014-08-25 LAB — T4, FREE: Free T4: 0.83 ng/dL (ref 0.61–1.12)

## 2014-08-25 MED ORDER — DEXTROSE 5 % IV SOLN
5.0000 mg/h | INTRAVENOUS | Status: DC
  Administered 2014-08-27 – 2014-08-28 (×2): 5 mg/h via INTRAVENOUS
  Filled 2014-08-25: qty 100

## 2014-08-25 MED ORDER — METOCLOPRAMIDE HCL 5 MG/ML IJ SOLN
10.0000 mg | Freq: Once | INTRAMUSCULAR | Status: AC
  Administered 2014-08-25: 10 mg via INTRAVENOUS
  Filled 2014-08-25: qty 2

## 2014-08-25 MED ORDER — METHOCARBAMOL 1000 MG/10ML IJ SOLN
500.0000 mg | Freq: Four times a day (QID) | INTRAVENOUS | Status: DC | PRN
  Filled 2014-08-25: qty 5

## 2014-08-25 MED ORDER — INSULIN ASPART 100 UNIT/ML ~~LOC~~ SOLN
0.0000 [IU] | SUBCUTANEOUS | Status: DC
  Administered 2014-08-25: 3 [IU] via SUBCUTANEOUS
  Administered 2014-08-25: 5 [IU] via SUBCUTANEOUS
  Administered 2014-08-26 (×4): 1 [IU] via SUBCUTANEOUS
  Administered 2014-08-26: 2 [IU] via SUBCUTANEOUS
  Administered 2014-08-27: 1 [IU] via SUBCUTANEOUS
  Administered 2014-08-27 (×2): 2 [IU] via SUBCUTANEOUS
  Administered 2014-08-28: 1 [IU] via SUBCUTANEOUS
  Administered 2014-08-28: 2 [IU] via SUBCUTANEOUS
  Administered 2014-08-28: 3 [IU] via SUBCUTANEOUS
  Administered 2014-08-28: 1 [IU] via SUBCUTANEOUS
  Administered 2014-08-28: 2 [IU] via SUBCUTANEOUS
  Administered 2014-08-28: 1 [IU] via SUBCUTANEOUS
  Administered 2014-08-29 (×2): 3 [IU] via SUBCUTANEOUS
  Administered 2014-08-29: 1 [IU] via SUBCUTANEOUS
  Administered 2014-08-29: 3 [IU] via SUBCUTANEOUS
  Administered 2014-08-29 – 2014-08-30 (×2): 1 [IU] via SUBCUTANEOUS
  Administered 2014-08-30: 5 [IU] via SUBCUTANEOUS

## 2014-08-25 MED ORDER — EZETIMIBE 10 MG PO TABS
10.0000 mg | ORAL_TABLET | Freq: Every day | ORAL | Status: DC
  Administered 2014-08-25 – 2014-08-30 (×5): 10 mg via ORAL
  Filled 2014-08-25 (×6): qty 1

## 2014-08-25 MED ORDER — PAROXETINE HCL 20 MG PO TABS
20.0000 mg | ORAL_TABLET | Freq: Every day | ORAL | Status: DC
  Administered 2014-08-25 – 2014-08-30 (×5): 20 mg via ORAL
  Filled 2014-08-25 (×6): qty 1

## 2014-08-25 MED ORDER — DEXTROSE-NACL 5-0.45 % IV SOLN
100.0000 mL/h | INTRAVENOUS | Status: DC
  Administered 2014-08-25: 100 mL/h via INTRAVENOUS

## 2014-08-25 MED ORDER — SODIUM CHLORIDE 0.9 % IV SOLN
INTRAVENOUS | Status: DC
  Administered 2014-08-25: 11:00:00 via INTRAVENOUS

## 2014-08-25 MED ORDER — DOCUSATE SODIUM 100 MG PO CAPS
100.0000 mg | ORAL_CAPSULE | Freq: Two times a day (BID) | ORAL | Status: DC
  Administered 2014-08-25 – 2014-08-30 (×9): 100 mg via ORAL
  Filled 2014-08-25 (×11): qty 1

## 2014-08-25 MED ORDER — MIRABEGRON ER 25 MG PO TB24
25.0000 mg | ORAL_TABLET | Freq: Every day | ORAL | Status: DC
Start: 2014-08-25 — End: 2014-08-25

## 2014-08-25 MED ORDER — DONEPEZIL HCL 10 MG PO TABS
10.0000 mg | ORAL_TABLET | Freq: Every day | ORAL | Status: DC
  Administered 2014-08-25 – 2014-08-29 (×5): 10 mg via ORAL
  Filled 2014-08-25 (×7): qty 1

## 2014-08-25 MED ORDER — METOPROLOL TARTRATE 25 MG PO TABS
25.0000 mg | ORAL_TABLET | Freq: Two times a day (BID) | ORAL | Status: DC
  Filled 2014-08-25: qty 1

## 2014-08-25 MED ORDER — ACETAMINOPHEN 325 MG PO TABS
650.0000 mg | ORAL_TABLET | ORAL | Status: DC | PRN
  Administered 2014-08-29: 650 mg via ORAL
  Filled 2014-08-25 (×2): qty 2

## 2014-08-25 MED ORDER — IOHEXOL 350 MG/ML SOLN
80.0000 mL | Freq: Once | INTRAVENOUS | Status: AC | PRN
  Administered 2014-08-25: 80 mL via INTRAVENOUS

## 2014-08-25 MED ORDER — CEFAZOLIN SODIUM-DEXTROSE 2-3 GM-% IV SOLR
2.0000 g | INTRAVENOUS | Status: AC
  Administered 2014-08-27: 2 g via INTRAVENOUS
  Filled 2014-08-25: qty 50

## 2014-08-25 MED ORDER — MORPHINE SULFATE 2 MG/ML IJ SOLN
0.5000 mg | INTRAMUSCULAR | Status: DC | PRN
  Administered 2014-08-27: 0.5 mg via INTRAVENOUS
  Filled 2014-08-25 (×2): qty 1

## 2014-08-25 MED ORDER — ASPIRIN EC 81 MG PO TBEC
81.0000 mg | DELAYED_RELEASE_TABLET | Freq: Every day | ORAL | Status: DC
  Administered 2014-08-25 – 2014-08-26 (×2): 81 mg via ORAL
  Filled 2014-08-25 (×3): qty 1

## 2014-08-25 MED ORDER — MORPHINE SULFATE 2 MG/ML IJ SOLN: 1.0000 mg | INTRAMUSCULAR | Status: DC | PRN

## 2014-08-25 MED ORDER — ENOXAPARIN SODIUM 40 MG/0.4ML ~~LOC~~ SOLN
40.0000 mg | SUBCUTANEOUS | Status: DC
  Administered 2014-08-25 – 2014-08-27 (×3): 40 mg via SUBCUTANEOUS
  Filled 2014-08-25 (×4): qty 0.4

## 2014-08-25 MED ORDER — HYDROCODONE-ACETAMINOPHEN 5-325 MG PO TABS
1.0000 | ORAL_TABLET | Freq: Four times a day (QID) | ORAL | Status: DC | PRN
  Administered 2014-08-25: 2 via ORAL
  Administered 2014-08-26: 1 via ORAL
  Filled 2014-08-25: qty 1
  Filled 2014-08-25: qty 2

## 2014-08-25 MED ORDER — METHOCARBAMOL 500 MG PO TABS
500.0000 mg | ORAL_TABLET | Freq: Four times a day (QID) | ORAL | Status: DC | PRN
  Administered 2014-08-25 – 2014-08-26 (×2): 500 mg via ORAL
  Filled 2014-08-25 (×3): qty 1

## 2014-08-25 MED ORDER — DEXTROSE 5 % IV SOLN
5.0000 mg/h | Freq: Once | INTRAVENOUS | Status: AC
  Administered 2014-08-25: 5 mg/h via INTRAVENOUS

## 2014-08-25 MED ORDER — OXYBUTYNIN CHLORIDE 5 MG PO TABS
5.0000 mg | ORAL_TABLET | Freq: Two times a day (BID) | ORAL | Status: DC
Start: 2014-08-25 — End: 2014-08-25

## 2014-08-25 MED ORDER — SODIUM CHLORIDE 0.9 % IV SOLN
INTRAVENOUS | Status: AC
  Administered 2014-08-25: 22:00:00 via INTRAVENOUS

## 2014-08-25 MED ORDER — ACETAMINOPHEN 500 MG PO TABS
1000.0000 mg | ORAL_TABLET | Freq: Once | ORAL | Status: AC
  Administered 2014-08-27: 1000 mg via ORAL
  Filled 2014-08-25: qty 2

## 2014-08-25 MED ORDER — MEMANTINE HCL ER 28 MG PO CP24
28.0000 mg | ORAL_CAPSULE | Freq: Every day | ORAL | Status: DC
  Administered 2014-08-26 – 2014-08-30 (×4): 28 mg via ORAL
  Filled 2014-08-25 (×6): qty 1

## 2014-08-25 MED ORDER — ONDANSETRON HCL 4 MG/2ML IJ SOLN
4.0000 mg | Freq: Once | INTRAMUSCULAR | Status: AC
  Administered 2014-08-25: 4 mg via INTRAVENOUS
  Filled 2014-08-25: qty 2

## 2014-08-25 MED ORDER — DILTIAZEM HCL 25 MG/5ML IV SOLN: 20.0000 mg | Freq: Once | INTRAVENOUS | Status: DC

## 2014-08-25 MED ORDER — FENTANYL CITRATE (PF) 100 MCG/2ML IJ SOLN
50.0000 ug | Freq: Once | INTRAMUSCULAR | Status: AC
  Administered 2014-08-25: 50 ug via INTRAVENOUS
  Filled 2014-08-25: qty 2

## 2014-08-25 NOTE — ED Notes (Signed)
Per [facility], pt from home, uses walker and fell backwards for unk reason other than unsteady gait. Pt was diaphoretic per EMS. Was given 150 mcg and 4 mg zofran. Has a hx dementia and apparently pt doesn't know. Shortening and rotation to the left leg. Heart rate 130. 20g to left wrist.

## 2014-08-25 NOTE — Progress Notes (Signed)
Pt arrived from the ED, pt and family oriented to unit and routine, CMT and eLink notified of arrival.  Family at bedside, call bell within reach. Pt on a cardizem secondary to afib-rate of 87, other VSS. Will continue to monitor.

## 2014-08-25 NOTE — ED Notes (Signed)
Admitting at the bedside.  

## 2014-08-25 NOTE — ED Notes (Signed)
Ben, [place] at the bedside and pt removed from the backboard.

## 2014-08-25 NOTE — ED Notes (Signed)
Ortho MD at bedside.

## 2014-08-25 NOTE — H&P (Signed)
[facility] History and Physical                                                                                    Brittney Powell, is a 81 y.o. female  MRN: [id number]   DOB - 01-Apr-1927  Admit Date - 08/25/2014  Outpatient Primary MD for the patient is [name], MD  Referring MD: Stark Jock / ER  Consulting M.D: [facility]  With History of -  Past Medical History  Diagnosis Date  . DM (diabetes mellitus)   . HTN (hypertension)   . Hypercholesterolemia   . Depression   . Breast cancer 1980    right  . Intrauterine pessary   . Wrist fracture 05/2011  . Osteoporosis   . Memory difficulty 05/15/2013  . Abnormality of gait 05/15/2013      Past Surgical History  Procedure Laterality Date  . Mastectomy modified radical Right     30 years ago    in for   Chief Complaint  Patient presents with  . Fall  . Hip Injury     HPI This is an 81 year old female patient who lives at home with her husband who presented to the ER after a fall. Patient has a past medical history of rapidly progressive dementia followed by Dr. Jannifer Franklin as an outpatient, diabetes, hypertension and dyslipidemia. Husband reports that as per usual he assisted patient out of bed and up to her rolling walker and then walked away from the patient so she could mobilize to the bathroom. Patient only took about 2 steps when she fell backward and landed on her left hip. According to the husband over the past year the patient has only fallen maybe 2-3 times. Patient did not have loss of consciousness with fall. Patient denied any chest pain, shortness of breath, lightheadedness or dizziness prior to fall. She does not recall tripping over the rug or other items on the floor. She reports she fell she simply lost her balance. He is not reporting pain upon arrival to the ER.  In the ER patient was afebrile, normotensive but tachycardic with a heart rate in the 120s with underlying rhythm of atrial fibrillation  which is new for her. She is maintaining room air saturations of 100%. Laboratory data were unremarkable except for glucose of 244. White count slightly elevated at 13,800 with neutrophils 59%. Hemoglobin 12.5. Creatinine 0.77 and BUN 18. Troponin 0.01. Hip and pelvis x-rays revealed osseous demineralization with displaced intertrochanteric fracture of the left femur. Orthopedic services are to been consulted and plans are to pursue operative intervention on Thursday. Because of the new onset atrial fibrillation EDP has contacted cardiology for formal consultation and patient has been started on a Cardizem infusion currently at 15 mg per hour. Of note a d-dimer was checked and was significantly elevated at 14.76.   Review of Systems   In addition to the HPI above,  No Fever-chills, myalgias or other constitutional symptoms No Headache, changes with Vision or hearing, new weakness, tingling, numbness in any extremity, No problems swallowing food or Liquids, indigestion/reflux No Chest pain, Cough or Shortness of Breath, palpitations, orthopnea or DOE No Abdominal pain, N/V;  no melena or hematochezia, no dark tarry stools, Bowel movements are regular, No dysuria, hematuria or flank pain No new skin rashes, lesions, masses or bruises, No new joints pains-aches No recent weight gain or loss No polyuria, polydypsia or polyphagia,  *A full 10 point Review of Systems was done, except as stated above, all other Review of Systems were negative.  Social History History  Substance Use Topics  . Smoking status: Never Smoker   . Smokeless tobacco: Never Used  . Alcohol Use: Yes     Comment: wine 4 oz at bedtime occassionaly    Resides at: Private residence  Lives with: Husband  Ambulatory status: Out of bed or chair with assistance of husband and utilizes rolling walker to ambulate; husband reports 2-3 falls in the past 12 months.   Family History Family History  Problem Relation Age of Onset    . Diabetes Father   . Cancer Father     leukemia  . Heart attack Brother   . Dementia Maternal Aunt      Prior to Admission medications   Medication Sig Start Date End Date Taking? Authorizing Provider  aspirin EC 81 MG tablet Take 81 mg by mouth daily.   Yes Historical Provider, MD  Calcium-Magnesium-Zinc [id number] MG TABS Take 1 tablet by mouth daily.   Yes Historical Provider, MD  donepezil (ARICEPT) 10 MG tablet Take 1 tablet (10 mg total) by mouth at bedtime. 09/16/13  Yes Kathrynn Ducking, MD  ezetimibe (ZETIA) 10 MG tablet Take 10 mg by mouth daily.   Yes Historical Provider, MD  glimepiride (AMARYL) 4 MG tablet Take 4 mg by mouth daily with breakfast.   Yes Historical Provider, MD  lisinopril (PRINIVIL,ZESTRIL) 5 MG tablet Take 5 mg by mouth daily.   Yes Historical Provider, MD  memantine (NAMENDA XR) 28 MG CP24 24 hr capsule Take 1 capsule (28 mg total) by mouth daily. 05/12/14  Yes Kathrynn Ducking, MD  Multiple Vitamins-Minerals (CENTRUM SILVER PO) Take 1 capsule by mouth 3 x daily with food.   Yes Historical Provider, MD  MYRBETRIQ 25 MG TB24 tablet Take 25 mg by mouth daily. 08/28/13  Yes Historical Provider, MD  oxybutynin (DITROPAN) 5 MG tablet Take 5 mg by mouth 2 (two) times daily.   Yes Historical Provider, MD  PARoxetine (PAXIL) 20 MG tablet Take 20 mg by mouth daily.   Yes Historical Provider, MD  vitamin B-12 (CYANOCOBALAMIN) 1000 MCG tablet Take 1,000 mcg by mouth daily.   Yes Historical Provider, MD    Allergies  Allergen Reactions  . Penicillins     Physical Exam  Vitals  Blood pressure 148/94, pulse 82, temperature 97.6 F (36.4 C), temperature source Oral, resp. rate 22, SpO2 96 %.   General:  In no acute distress, appears healthy and well nourished  Psych:  Normal affect, Denies Suicidal or Homicidal ideations, Awake Alert, Oriented X name and place. Speech and thought patterns are mostly clear  Neuro:   No focal neurological deficits, CN II through  XII intact, Strength 5/5 all 4 extremities, Sensation intact all 4 extremities.  ENT:  Ears and Eyes appear Normal, Conjunctivae clear, PER. Moist oral mucosa without erythema or exudates.  Neck:  Supple, No lymphadenopathy appreciated  Respiratory:  Symmetrical chest wall movement, Good air movement bilaterally, CTAB. Now on oxygen at 2 L/m with sats 96%  Cardiac:  RRR, No Murmurs, no LE edema noted, no JVD, No carotid bruits, peripheral pulses palpable at 2+  Abdomen:  Positive bowel sounds, Soft, Non tender, Non distended,  No masses appreciated, no obvious hepatosplenomegaly  Skin:  No Cyanosis, Normal Skin Turgor, No Skin Rash or Bruise.  Extremities: Symmetrical with small palpable deformity on anterior left thigh without bruising,  no effusions.  Data Review  CBC  Recent Labs Lab 08/25/14 0937  WBC 13.8*  HGB 12.5  HCT 36.4  PLT 239  MCV 95.3  MCH 32.7  MCHC 34.3  RDW 12.5  LYMPHSABS 5.1*  MONOABS 0.4  EOSABS 0.1  BASOSABS 0.0    Chemistries   Recent Labs Lab 08/25/14 0937  NA 137  K 4.1  CL 102  CO2 26  GLUCOSE 244*  BUN 18  CREATININE 0.77  CALCIUM 8.8*    CrCl cannot be calculated (Unknown ideal weight.).  No results for input(s): TSH, T4TOTAL, T3FREE, THYROIDAB in the last 72 hours.  Invalid input(s): FREET3  Coagulation profile No results for input(s): INR, PROTIME in the last 168 hours.   Recent Labs  08/25/14 0937  DDIMER 14.76*    Cardiac Enzymes No results for input(s): CKMB, TROPONINI, MYOGLOBIN in the last 168 hours.  Invalid input(s): CK  Invalid input(s): POCBNP  Urinalysis No results found for: COLORURINE, APPEARANCEUR, LABSPEC, PHURINE, GLUCOSEU, HGBUR, BILIRUBINUR, KETONESUR, PROTEINUR, UROBILINOGEN, NITRITE, LEUKOCYTESUR  Imaging results:   Dg Hip Unilat With Pelvis 2-3 Views Left  08/25/2014   CLINICAL DATA:  Hip pain, confusion  EXAM: LEFT HIP (WITH PELVIS) 2-3 VIEWS  COMPARISON:  None  FINDINGS: Osseous  demineralization.  Displaced intertrochanteric fracture LEFT femur.  Narrowing of both hip joints without dislocation.  SI joints symmetric.  Atherosclerotic calcification aorta.  Pessary projects over pelvis.  IMPRESSION: Osseous demineralization with displaced intertrochanteric fracture LEFT femur.   Electronically Signed   By: Lavonia Dana M.D.   On: 08/25/2014 09:58     EKG: (Independently reviewed) atrial fibrillation with ventricular rate 147, right axis deviation, subtle ST segment depression in inferior lateral leads likely related to rapid rate   Assessment & Plan  Principal Problem:   Displaced intertrochanteric fracture of left femur -Admit to stepdown (see below) -Appreciate orthopedic service assistance -Surgery planned for Thursday 08/27/14 -Have instituted hip fracture orders and protocols -Suspect will need some sort of formal inpatient rehabilitative services prior to return to home either thru CIR or SNF  Active Problems:   Atrial fibrillation with RVR (new onset) -Per EDP report cardiology consultation pending -Continue IV Cardizem -Check 2-D echocardiogram -CHADVASC = 5 so this would qualify patient for anticoagulation but given advanced dementia and documented gait disturbance and recent fall prior to this admission suspect she would not be an appropriate candidate for chronic anticoagulation for atrial fibrillation    Elevated d-dimer -Somewhat elevated without symptoms including no edema of legs, no recent bed rest for long distance travel and no apparent current history of malignancy -Check CT angiogram of chest; if positive given acute femur fracture that will require surgical intervention suspect patient would most benefit from IVC filter placement -For now have ordered DVT prophylaxis Lovenox -Nothing by mouth until PE status clarified in the event patient can undergo IVC filter today if indicated    Diabetes mellitus type II, uncontrolled -CBG greater than 200  at presentation and likely related to current stressors -Check CBG and provide sliding scale coverage -Hold Amaryl while nothing by mouth -Check hemoglobin A1c    Fall -Appears to be mechanical in nature based on husband and patient report -Once appropriate from an  orthopedic standpoint will need formal PT/OT evaluation prior to discharge    Dementia -Continue preadmission Namenda and Aricept -Monitor for acute delirium in setting of hospitalization, utilization of narcotic pain medications, and post surgical intervention    HTN (hypertension) -Continue preadmission medication of lisinopril    Chronic urinary urgency and frequency -Continue Ditropan and Myrbetriq -Patient noted to be wearing a diaper-with hip fracture likelihood patient will be unable to comfortably get on and off bed pan therefore have ordered Foley catheter placement    DVT Prophylaxis: Lovenox  Family Communication:   Husband at bedside  Code Status:  Full code  Condition:  Stable  Discharge disposition: Anticipate will require discharge to rehabilitation therapy facility prior to returning to home environment with husband  Time spent in minutes : 60      ELLIS,ALLISON L. ANP on 08/25/2014 at 11:08 AM  Between 7am to 7pm - Pager - [phone]  After 7pm go to [email or web address] - password [email or web address]  And look for the night coverage person covering me after hours  [facility]

## 2014-08-25 NOTE — Care Management Note (Signed)
Case Management Note  Patient Details  Name: Brittney Powell MRN: [id number] Date of Birth: 29-Aug-1927  Subjective/Objective:                 From home admitted s/p fall   Action/Plan: Return to home when medically stable. CM to f/u with d/c needs. Ret Expected Discharge Date:                  Expected Discharge Plan:  [place]  In-House Referral:     Discharge planning Services  CM Consult  Post Acute Care Choice:    Choice offered to:     DME Arranged:    DME Agency:     HH Arranged:    [place] Agency:     Status of Service:  In process, will continue to follow  Medicare Important Message Given:    Date Medicare IM Given:    Medicare IM give by:    Date Additional Medicare IM Given:    Additional Medicare Important Message give by:     If discussed at [place] of Stay Meetings, dates discussed:    Additional Comments: [place]  Sharin Mons, RN 08/25/2014, 4:00 PM

## 2014-08-25 NOTE — Evaluation (Signed)
Clinical/Bedside Swallow Evaluation Patient Details  Name: Brittney Powell MRN: [id number] Date of Birth: 1927/07/19  Today's Date: 08/25/2014 Time: SLP Start Time (ACUTE ONLY): 3704 SLP Stop Time (ACUTE ONLY): 8889 SLP Time Calculation (min) (ACUTE ONLY): 15 min  Past Medical History:  Past Medical History  Diagnosis Date  . DM (diabetes mellitus)   . HTN (hypertension)   . Hypercholesterolemia   . Depression   . Breast cancer 1980    right  . Intrauterine pessary   . Wrist fracture 05/2011  . Osteoporosis   . Memory difficulty 05/15/2013  . Abnormality of gait 05/15/2013  . Dementia   . TIA (transient ischemic attack)    Past Surgical History:  Past Surgical History  Procedure Laterality Date  . Mastectomy modified radical Right     30 years ago   HPI:  81 yo female who presents from home s/p fall with hip fx. CTA shows a "huge hiatal hernia" as well as a "huge goiter" (10 x 6.4 x 6.4 cm) which results in slight anterior deviation of the trachea. The esophagus is deviated and compressed. PMH significant for rapidly progressive dementia, diabetes, hypertension, and dyslipidemia.   Assessment / Plan / Recommendation Clinical Impression  Pt has audible swallows with thin liquids indicative of a possible dysphagia, although without overt signs of aspiration across consistencies tested. Given the significant size of the goiter and its compression on the esophagus, as well as the presence of a large hiatal hernia, would recommend mechanical soft textures and thin liquids with use of aspiration and reflux precautions. SLP to follow briefly for tolerance.    Aspiration Risk  Mild    Diet Recommendation Dysphagia 3 (Mech soft);Thin   Medication Administration: Crushed with puree Compensations: Slow rate;Small sips/bites;Follow solids with liquid    Other  Recommendations Oral Care Recommendations: Oral care BID   Follow Up Recommendations   tba    Frequency and Duration min  2x/week  1 week   Pertinent Vitals/Pain n/a    SLP Swallow Goals     Swallow Study Prior Functional Status       General Other Pertinent Information: 81 yo female who presents from home s/p fall with hip fx. CTA shows a "huge hiatal hernia" as well as a "huge goiter" (10 x 6.4 x 6.4 cm) which results in slight anterior deviation of the trachea. The esophagus is deviated and compressed. PMH significant for rapidly progressive dementia, diabetes, hypertension, and dyslipidemia. Type of Study: Bedside swallow evaluation Previous Swallow Assessment: none in chart Diet Prior to this Study: NPO Temperature Spikes Noted: No Respiratory Status: Supplemental O2 delivered via (comment) ([place]) History of Recent Intubation: No Behavior/Cognition: Alert;Cooperative;Pleasant mood;Confused;Requires cueing Oral Cavity - Dentition: Adequate natural dentition/normal for age Self-Feeding Abilities: Able to feed self;Needs assist Patient Positioning: Upright in bed Baseline Vocal Quality: Normal    Oral/Motor/Sensory Function Overall Oral Motor/Sensory Function: Appears within functional limits for tasks assessed   Ice Chips Ice chips: Not tested   Thin Liquid Thin Liquid: Impaired Presentation: Cup;Self Fed;Straw Pharyngeal  Phase Impairments: Other (comments) (audible swallow)    Nectar Thick Nectar Thick Liquid: Not tested   Honey Thick Honey Thick Liquid: Not tested   Puree Puree: Within functional limits Presentation: Self Fed;Spoon   Solid    Solid: Within functional limits Presentation: Self Fed      Brittney Powell, M.A. CCC-SLP [phone]  Brittney Powell 08/25/2014,3:52 PM

## 2014-08-25 NOTE — Consult Note (Signed)
ORTHOPAEDIC CONSULTATION  REQUESTING PHYSICIAN: Veryl Speak, MD  Chief Complaint: left hip fracture  HPI: Brittney Powell is a 81 y.o. female who complains of a mechanical fall onto her Left side. She was a household ambulator with a walker prior to this. She has new onset a-fib  Past Medical History  Diagnosis Date  . DM (diabetes mellitus)   . HTN (hypertension)   . Hypercholesterolemia   . Depression   . Breast cancer 1980    right  . Intrauterine pessary   . Wrist fracture 05/2011  . Osteoporosis   . Memory difficulty 05/15/2013  . Abnormality of gait 05/15/2013   Past Surgical History  Procedure Laterality Date  . Mastectomy modified radical Right     30 years ago   History   Social History  . Marital Status: Married    Spouse Name: N/A  . Number of Children: 2  . Years of Education: 9TH   Occupational History  . Retired    Social History Main Topics  . Smoking status: Never Smoker   . Smokeless tobacco: Never Used  . Alcohol Use: Yes     Comment: wine 4 oz at bedtime occassionaly  . Drug Use: No  . Sexual Activity: Not on file   Other Topics Concern  . None   Social History Narrative   Family History  Problem Relation Age of Onset  . Diabetes Father   . Cancer Father     leukemia  . Heart attack Brother   . Dementia Maternal Aunt    Allergies  Allergen Reactions  . Penicillins    Prior to Admission medications   Medication Sig Start Date End Date Taking? Authorizing Provider  aspirin EC 81 MG tablet Take 81 mg by mouth daily.   Yes Historical Provider, MD  Calcium-Magnesium-Zinc [phone] MG TABS Take 1 tablet by mouth daily.   Yes Historical Provider, MD  donepezil (ARICEPT) 10 MG tablet Take 1 tablet (10 mg total) by mouth at bedtime. 09/16/13  Yes Kathrynn Ducking, MD  ezetimibe (ZETIA) 10 MG tablet Take 10 mg by mouth daily.   Yes Historical Provider, MD  glimepiride (AMARYL) 4 MG tablet Take 4 mg by mouth daily with breakfast.   Yes  Historical Provider, MD  lisinopril (PRINIVIL,ZESTRIL) 5 MG tablet Take 5 mg by mouth daily.   Yes Historical Provider, MD  memantine (NAMENDA XR) 28 MG CP24 24 hr capsule Take 1 capsule (28 mg total) by mouth daily. 05/12/14  Yes Kathrynn Ducking, MD  Multiple Vitamins-Minerals (CENTRUM SILVER PO) Take 1 capsule by mouth 3 x daily with food.   Yes Historical Provider, MD  MYRBETRIQ 25 MG TB24 tablet Take 25 mg by mouth daily. 08/28/13  Yes Historical Provider, MD  oxybutynin (DITROPAN) 5 MG tablet Take 5 mg by mouth 2 (two) times daily.   Yes Historical Provider, MD  PARoxetine (PAXIL) 20 MG tablet Take 20 mg by mouth daily.   Yes Historical Provider, MD  vitamin B-12 (CYANOCOBALAMIN) 1000 MCG tablet Take 1,000 mcg by mouth daily.   Yes Historical Provider, MD   Dg Hip Unilat With Pelvis 2-3 Views Left  08/25/2014   CLINICAL DATA:  Hip pain, confusion  EXAM: LEFT HIP (WITH PELVIS) 2-3 VIEWS  COMPARISON:  None  FINDINGS: Osseous demineralization.  Displaced intertrochanteric fracture LEFT femur.  Narrowing of both hip joints without dislocation.  SI joints symmetric.  Atherosclerotic calcification aorta.  Pessary projects over pelvis.  IMPRESSION: Osseous  demineralization with displaced intertrochanteric fracture LEFT femur.   Electronically Signed   By: Lavonia Dana M.D.   On: 08/25/2014 09:58    Positive ROS: All other systems have been reviewed and were otherwise negative with the exception of those mentioned in the HPI and as above.  Labs cbc  Recent Labs  08/25/14 0937  WBC 13.8*  HGB 12.5  HCT 36.4  PLT 239    Labs inflam No results for input(s): CRP in the last 72 hours.  Invalid input(s): ESR  Labs coag No results for input(s): INR, PTT in the last 72 hours.  Invalid input(s): PT   Recent Labs  08/25/14 0937  NA 137  K 4.1  CL 102  CO2 26  GLUCOSE 244*  BUN 18  CREATININE 0.77  CALCIUM 8.8*    Physical Exam: Filed Vitals:   08/25/14 1012  BP: 155/85  Pulse:  143  Temp:   Resp: 20   General: Alert, no acute distress Cardiovascular: No pedal edema Respiratory: No cyanosis, no use of accessory musculature GI: No organomegaly, abdomen is soft and non-tender Skin: No lesions in the area of chief complaint other than those listed below in MSK exam.  Neurologic: Sensation intact distally Psychiatric: Patient is competent for consent with normal mood and affect Lymphatic: No axillary or cervical lymphadenopathy  MUSCULOSKELETAL:  LLE: distally she is NVI and skin is benign, painless ROM Other extremities are atraumatic with painless ROM and NVI.  Assessment: Left hip intertroch fracture  Plan: OR for IM nail on Thursday 7/7 Weight Bearing Status: bedrest until post op Cards and hospitalits to see for pre-op risk stratification.    Renette Butters, MD Cell [phone]   08/25/2014 10:29 AM

## 2014-08-25 NOTE — Consult Note (Signed)
Cardiology Consultation Note  Patient ID: Brittney Powell, MRN: [id number], DOB/AGE: 30-Mar-1927 81 y.o. Admit date: 08/25/2014   Date of Consult: 08/25/2014 Primary Physician: [name], MD Primary Cardiologist: New to Dr. Acie Fredrickson (husband follows with him as well)  Chief Complaint: fall Reason for Consultation: newly recognized atrial fib, pre-op clearance  HPI: Brittney Powell is an 81 y/o F with history of progressive dementia, gait instability, falls, DM, HTN, HLD, remote breast cancer, prior mini-strokes who presented to [facility] today with fall and hip fracture. She relies on her husband heavily to help assist with ADLs. She is not oriented to place or date. She can only give very minimal details about why she is here based on the questions she is being asked, and defers to her husband to assist with the history. She walks with a walker and has had 3 falls this past year. This morning her husband was helping her out of bed this morning to go to the bathroom. She was standing within her walker. He stepped away for a brief second to turn the lights on in the bathroom when he heard her hit the floor on her left hip. She was conscious. She and her husband deny any syncope - states that she just "fell over." She did complain of hip pain and nausea afterwards. EMS was called who noted her to be in atrial fib RVR HR 130s-140s. She denies any chest pain, dyspnea, LEE, orthopnea, syncope, palpitations, prior cardiac workup or any bleeding history. She was given fentanyl, reglan, zofran, and diltiazem drip now at 15mg /mL. Labs notable for WBC 13.8k, troponin negative, normal Cr, glucose 244, d-dimer 14. (O2 sats were 89% RA on arrival, improved with [place].) Hip film showed displaced intertrochanteric fracture LEFT femur. CTA done for + d-dimer showed no PE, but did note old rib fractures and large goiter (deviated and compressed esophagus), huge hiatal hernia creating secondary atelectasis in the left lower  lobe. Orthopedics has seen her and tentatively plans for OR on Thursday. We are asked to see because of new AF and pre-op clearance.   Past Medical History  Diagnosis Date  . DM (diabetes mellitus)   . HTN (hypertension)   . Hypercholesterolemia   . Depression   . Breast cancer 1980    right  . Intrauterine pessary   . Wrist fracture 05/2011  . Osteoporosis   . Memory difficulty 05/15/2013  . Abnormality of gait 05/15/2013  . Dementia   . TIA (transient ischemic attack)       Most Recent Cardiac Studies: None   Surgical History:  Past Surgical History  Procedure Laterality Date  . Mastectomy modified radical Right     30 years ago     Home Meds: Prior to Admission medications   Medication Sig Start Date End Date Taking? Authorizing Provider  aspirin EC 81 MG tablet Take 81 mg by mouth daily.   Yes Historical Provider, MD  Calcium-Magnesium-Zinc [phone] MG TABS Take 1 tablet by mouth daily.   Yes Historical Provider, MD  donepezil (ARICEPT) 10 MG tablet Take 1 tablet (10 mg total) by mouth at bedtime. 09/16/13  Yes Kathrynn Ducking, MD  ezetimibe (ZETIA) 10 MG tablet Take 10 mg by mouth daily.   Yes Historical Provider, MD  glimepiride (AMARYL) 4 MG tablet Take 4 mg by mouth daily with breakfast.   Yes Historical Provider, MD  lisinopril (PRINIVIL,ZESTRIL) 5 MG tablet Take 5 mg by mouth daily.   Yes Historical Provider, MD  memantine (NAMENDA XR) 28 MG CP24 24 hr capsule Take 1 capsule (28 mg total) by mouth daily. 05/12/14  Yes Kathrynn Ducking, MD  Multiple Vitamins-Minerals (CENTRUM SILVER PO) Take 1 capsule by mouth 3 x daily with food.   Yes Historical Provider, MD  MYRBETRIQ 25 MG TB24 tablet Take 25 mg by mouth daily. 08/28/13  Yes Historical Provider, MD  oxybutynin (DITROPAN) 5 MG tablet Take 5 mg by mouth 2 (two) times daily.   Yes Historical Provider, MD  PARoxetine (PAXIL) 20 MG tablet Take 20 mg by mouth daily.   Yes Historical Provider, MD  vitamin B-12  (CYANOCOBALAMIN) 1000 MCG tablet Take 1,000 mcg by mouth daily.   Yes Historical Provider, MD    Inpatient Medications:  . insulin aspart  0-9 Units Subcutaneous 6 times per day   . sodium chloride 50 mL/hr at 08/25/14 1122    Allergies:  Allergies  Allergen Reactions  . Penicillins     History   Social History  . Marital Status: Married    Spouse Name: N/A  . Number of Children: 2  . Years of Education: 9TH   Occupational History  . Retired    Social History Main Topics  . Smoking status: Never Smoker   . Smokeless tobacco: Never Used  . Alcohol Use: No  . Drug Use: No  . Sexual Activity: Not on file   Other Topics Concern  . Not on file   Social History Narrative     Family History  Problem Relation Age of Onset  . Diabetes Father   . Cancer Father     leukemia  . Heart attack Brother   . Dementia Maternal Aunt      Review of Systems: All other systems reviewed and are otherwise negative except as noted above - with help of husband. Patient limited in her accuracy as historian.  Labs:  Lab Results  Component Value Date   WBC 13.8* 08/25/2014   HGB 12.5 08/25/2014   HCT 36.4 08/25/2014   MCV 95.3 08/25/2014   PLT 239 08/25/2014    Recent Labs Lab 08/25/14 0937  NA 137  K 4.1  CL 102  CO2 26  BUN 18  CREATININE 0.77  CALCIUM 8.8*  GLUCOSE 244*    Lab Results  Component Value Date   DDIMER 14.76* 08/25/2014    Radiology/Studies:  Ct Angio Chest Pe W/cm &/or Wo Cm  08/25/2014   CLINICAL DATA:  Elevated D-dimer.  Patient fell yesterday.  EXAM: CT ANGIOGRAPHY CHEST WITH CONTRAST  TECHNIQUE: Multidetector CT imaging of the chest was performed using the standard protocol during bolus administration of intravenous contrast. Multiplanar CT image reconstructions and MIPs were obtained to evaluate the vascular anatomy.  CONTRAST:  69mL OMNIPAQUE IOHEXOL 350 MG/ML SOLN  COMPARISON:  None.  FINDINGS: There are fractures of the anterior lateral  aspects of the left fourth, fifth and sixth ribs bilaterally, age indeterminate.  The patient has a huge goiter that extends substernal to the level of the carina measuring 10 x 6.4 x 6.4 cm. The trachea is slightly anteriorly deviated but not compressed. The esophagus is deviated and compressed.  There are no pulmonary emboli, infiltrates, or effusions. The patient has a large hiatal hernia containing the entire fundus of the stomach. There is secondary compressive atelectasis in the left lower lobe. There is chronic accentuation of the thoracic kyphosis. Extensive calcification thoracic aorta with moderate coronary artery calcification. Far all heart size is normal. The visualized  portion of the upper abdomen is normal except for aortic atherosclerosis.  Review of the MIP images confirms the above findings.  IMPRESSION: 1. No pulmonary emboli or other acute abnormalities of the chest. 2. Huge substernal goiter. 3. Huge hiatal hernia which create secondary compressive atelectasis in the left lower lobe. 4. Subtle fractures of the anterior aspects of the left fourth, fifth and sixth ribs bilaterally, age indeterminate.   Electronically Signed   By: Lorriane Shire M.D.   On: 08/25/2014 12:06   Dg Hip Unilat With Pelvis 2-3 Views Left  08/25/2014   CLINICAL DATA:  Hip pain, confusion  EXAM: LEFT HIP (WITH PELVIS) 2-3 VIEWS  COMPARISON:  None  FINDINGS: Osseous demineralization.  Displaced intertrochanteric fracture LEFT femur.  Narrowing of both hip joints without dislocation.  SI joints symmetric.  Atherosclerotic calcification aorta.  Pessary projects over pelvis.  IMPRESSION: Osseous demineralization with displaced intertrochanteric fracture LEFT femur.   Electronically Signed   By: Lavonia Dana M.D.   On: 08/25/2014 09:58    Wt Readings from Last 3 Encounters:  03/23/14 121 lb (54.885 kg)  09/16/13 127 lb (57.607 kg)  05/15/13 116 lb (52.617 kg)   EKG: atrial fibrillation 147bpm with mild ST depression  inferiorly and V3-V6   Physical Exam: Blood pressure 161/88, pulse 94, temperature 97.6 F (36.4 C), temperature source Oral, resp. rate 20, SpO2 97 %. General: Frail elderly WF in no acute distress. Head: Normocephalic, atraumatic, sclera non-icteric, no xanthomas, nares are without discharge.  Neck: Negative for carotid bruits. JVD not elevated. Lungs: Clear bilaterally to auscultation without wheezes, rales, or rhonchi. Breathing is unlabored. Heart: Irregularly irregular, elevated rate, with S1 S2. No murmurs, rubs, or gallops appreciated. Abdomen: Soft, non-tender, non-distended with normoactive bowel sounds. No hepatomegaly. No rebound/guarding. No obvious abdominal masses. Msk:  Strength and tone appear normal for age. Extremities: No clubbing or cyanosis. No edema.  Distal pedal pulses are 2+ and equal bilaterally. Neuro: Alert and oriented to self only, did not know where she was (even with cues) or what the date/season was - states "I don't keep up with that." Psych:  Pleasantly demented affect    Assessment and Plan:   1. Recurrent fall with resultant left hip fracture - ortho plans for surgery tentatively on 7/7 AM. From a preoperative standpoint she denies any recent anginal symptoms. However, she is generally inactive due to limited mobility so it is unknown if she has had any concerning exertional symptoms. Given age and comorbidities it is prudent to start with an echocardiogram for assessment. Will discuss further evaluation with Dr. Acie Fredrickson.  2. Newly recognized atrial fibrillation with RVR - rates are currently 105-110 on IV diltiazem. Will add metoprolol 25mg  BID. If LV dysfunction is noted on echocardiogram, would opt for BB therapy only. Discussed patient with Dr. Acie Fredrickson and internal medicine. Given recurrent falls including hip fracture this admission, noted prior rib fractures on CXR, gait instability and overall frailty, she does not seem like a good anticoagulation  candidate. Will need to follow plans for discharge and post-op recovery. If she goes to SNF we may be able to consider this. Thyroid function ordered given huge goiter on CT.  3. Essential hypertension - hold lisinopril to allow for titration of rate-controlling agents.  4. Diabetes mellitus - A1c in process. She is on SSI.   5. Incidental findings of huge thyroid goiter creating esophageal compression, and huge hiatal hernia leading to atelectasis of LLL (?source of hypoxia) - will check  thyroid function. Further per IM.  SignedMelina Copa PA-C 08/25/2014, 12:32 PM Pager: [phone]   Attending Note:   The patient was seen and examined.  Agree with assessment and plan as noted above.  Changes made to the above note as needed.  1. New onset atrial fib: Pt presents with new atrial fib.  She has a CHADS2VASC score of  6   ( DM, age > 63, female, prior CVA)  She has had several falls over the past year and has had previous rib fractures by CT scan .  Will concentrate on rate control. Will get an echo to assess her LV function.  If the echo looks ok, she should be at acceptable risk to go for her hip repair on Thursday .  The issue of long term anticoagulation is not an easy one. She clearly is at risk for recurrent CVAs but is also at risk for falling.  She has severe dementia. At this point, I would favor a trial of Eliquis ( after her surgery )  and see how she does.     Will start metoprolol today .  2. Hip fracture :  Scheduled for surgery in 2 days.     Thayer Headings, Brooke Bonito., MD, [facility] 08/25/2014, 1:17 PM [address]  [place] Pager [phone]

## 2014-08-25 NOTE — Progress Notes (Signed)
Pt's heart rate is now in the 70s in afib on diltiazem drip. She did not yet get metoprolol. Suspect HR was higher earlier due to pain/nausea. Will hold off on giving metoprolol and continue diltiazem only for now. Jamerson Vonbargen PA-C

## 2014-08-25 NOTE — ED Notes (Signed)
Attempted report 

## 2014-08-25 NOTE — ED Provider Notes (Signed)
CSN: [id number]     Arrival date & time 08/25/14  0847 History   First MD Initiated Contact with Patient 08/25/14 [phone]     Chief Complaint  Patient presents with  . Fall  . Hip Injury     (Consider location/radiation/quality/duration/timing/severity/associated sxs/prior Treatment) HPI Brittney Powell is a 81 y.o. female hx of DM, HTN, HLD, comes in for evaluation of a fall. Patient has Alzheimer's dementia at baseline but is able to give appropriate history of present illness. Patient is given by her husband who contributes to history of present illness. Husband states at approximately 7:00 this morning they were getting out of the bed, patient stood up with her walker, took 2 steps, and as husband turned around to the bathroom door, patient fell backwards landing on left hip. He denies any head trauma, loss of consciousness. Patient denies any lightheadedness, dizziness, chest pain, shortness of breath, numbness or weakness. States she believes that she lost her balance. She denies any pain or discomfort now in the ED. She does report feeling nauseous.  Past Medical History  Diagnosis Date  . DM (diabetes mellitus)   . HTN (hypertension)   . Hypercholesterolemia   . Depression   . Breast cancer 1980    right  . Intrauterine pessary   . Wrist fracture 05/2011  . Osteoporosis   . Memory difficulty 05/15/2013  . Abnormality of gait 05/15/2013   Past Surgical History  Procedure Laterality Date  . Mastectomy modified radical Right     30 years ago   Family History  Problem Relation Age of Onset  . Diabetes Father   . Cancer Father     leukemia  . Heart attack Brother   . Dementia Maternal Aunt    History  Substance Use Topics  . Smoking status: Never Smoker   . Smokeless tobacco: Never Used  . Alcohol Use: Yes     Comment: wine 4 oz at bedtime occassionaly   OB History    No data available     Review of Systems A 10 point review of systems was completed and was negative  except for pertinent positives and negatives as mentioned in the history of present illness     Allergies  Penicillins  Home Medications   Prior to Admission medications   Medication Sig Start Date End Date Taking? Authorizing Provider  aspirin EC 81 MG tablet Take 81 mg by mouth daily.   Yes Historical Provider, MD  Calcium-Magnesium-Zinc [id number] MG TABS Take 1 tablet by mouth daily.   Yes Historical Provider, MD  donepezil (ARICEPT) 10 MG tablet Take 1 tablet (10 mg total) by mouth at bedtime. 09/16/13  Yes Kathrynn Ducking, MD  ezetimibe (ZETIA) 10 MG tablet Take 10 mg by mouth daily.   Yes Historical Provider, MD  glimepiride (AMARYL) 4 MG tablet Take 4 mg by mouth daily with breakfast.   Yes Historical Provider, MD  lisinopril (PRINIVIL,ZESTRIL) 5 MG tablet Take 5 mg by mouth daily.   Yes Historical Provider, MD  memantine (NAMENDA XR) 28 MG CP24 24 hr capsule Take 1 capsule (28 mg total) by mouth daily. 05/12/14  Yes Kathrynn Ducking, MD  Multiple Vitamins-Minerals (CENTRUM SILVER PO) Take 1 capsule by mouth 3 x daily with food.   Yes Historical Provider, MD  MYRBETRIQ 25 MG TB24 tablet Take 25 mg by mouth daily. 08/28/13  Yes Historical Provider, MD  oxybutynin (DITROPAN) 5 MG tablet Take 5 mg by mouth 2 (two) times daily.  Yes Historical Provider, MD  PARoxetine (PAXIL) 20 MG tablet Take 20 mg by mouth daily.   Yes Historical Provider, MD  vitamin B-12 (CYANOCOBALAMIN) 1000 MCG tablet Take 1,000 mcg by mouth daily.   Yes Historical Provider, MD   BP 148/94 mmHg  Pulse 82  Temp(Src) 97.6 F (36.4 C) (Oral)  Resp 22  SpO2 96% Physical Exam  Constitutional: She is oriented to person, place, and time. She appears well-developed and well-nourished.  HENT:  Head: Normocephalic and atraumatic.  Mouth/Throat: Oropharynx is clear and moist.  Eyes: Conjunctivae are normal. Pupils are equal, round, and reactive to light. Right eye exhibits no discharge. Left eye exhibits no discharge.  No scleral icterus.  Neck: Neck supple.  Cardiovascular: Normal rate, regular rhythm and normal heart sounds.   Tachycardic to mid 120s with irregularly irregular rhythm.  Pulmonary/Chest: Effort normal and breath sounds normal. No respiratory distress. She has no wheezes. She has no rales.  Abdominal: Soft. There is no tenderness.  Musculoskeletal: She exhibits no tenderness.  Left lower extremity is shortened and externally rotated. No open wounds noted.  Neurological: She is alert and oriented to person, place, and time.  Cranial Nerves II-XII grossly intact. Motor and sensation intact.  Skin: Skin is warm and dry. No rash noted.  Psychiatric: She has a normal mood and affect.  Nursing note and vitals reviewed.   ED Course  Procedures (including critical care time) Labs Review Labs Reviewed  D-DIMER, QUANTITATIVE (NOT AT [facility]) - Abnormal; Notable for the following:    D-Dimer, Quant 14.76 (*)    All other components within normal limits  CBC WITH DIFFERENTIAL/PLATELET - Abnormal; Notable for the following:    WBC 13.8 (*)    RBC 3.82 (*)    Neutro Abs 8.2 (*)    Lymphs Abs 5.1 (*)    All other components within normal limits  BASIC METABOLIC PANEL - Abnormal; Notable for the following:    Glucose, Bld 244 (*)    Calcium 8.8 (*)    All other components within normal limits  HEMOGLOBIN A1C  I-STAT TROPOININ, ED    Imaging Review Dg Hip Unilat With Pelvis 2-3 Views Left  08/25/2014   CLINICAL DATA:  Hip pain, confusion  EXAM: LEFT HIP (WITH PELVIS) 2-3 VIEWS  COMPARISON:  None  FINDINGS: Osseous demineralization.  Displaced intertrochanteric fracture LEFT femur.  Narrowing of both hip joints without dislocation.  SI joints symmetric.  Atherosclerotic calcification aorta.  Pessary projects over pelvis.  IMPRESSION: Osseous demineralization with displaced intertrochanteric fracture LEFT femur.   Electronically Signed   By: Lavonia Dana M.D.   On: 08/25/2014 09:58     EKG  Interpretation None     Meds given in ED:  Medications  insulin aspart (novoLOG) injection 0-9 Units (not administered)  0.9 %  sodium chloride infusion ( Intravenous New Bag/Given 08/25/14 1122)  acetaminophen (TYLENOL) tablet 650 mg (not administered)  morphine 2 MG/ML injection 1 mg (not administered)  ondansetron (ZOFRAN) injection 4 mg (4 mg Intravenous Given 08/25/14 0938)  diltiazem (CARDIZEM) 100 mg in dextrose 5 % 100 mL (1 mg/mL) infusion (10 mg/hr Intravenous Rate/Dose Change 08/25/14 1122)  fentaNYL (SUBLIMAZE) injection 50 mcg (50 mcg Intravenous Given 08/25/14 1054)  metoCLOPramide (REGLAN) injection 10 mg (10 mg Intravenous Given 08/25/14 1054)    New Prescriptions   No medications on file   Filed Vitals:   08/25/14 1012 08/25/14 1015 08/25/14 1030 08/25/14 1045  BP: 155/85 134/100 152/99 148/94  Pulse: 143 135 112 82  Temp:      TempSrc:      Resp: 20 21 20 22   SpO2: 98% 100% 91% 96%   CRITICAL CARE Performed by: Verl Dicker   Total critical care time: 35  Critical care time was exclusive of separately billable procedures and treating other patients.  Critical care was necessary to treat or prevent imminent or life-threatening deterioration.  Critical care was time spent personally by me on the following activities: development of treatment plan with patient and/or surrogate as well as nursing, discussions with consultants, evaluation of patient's response to treatment, examination of patient, obtaining history from patient or surrogate, ordering and performing treatments and interventions, ordering and review of laboratory studies, ordering and review of radiographic studies, pulse oximetry and re-evaluation of patient's condition.  MDM  Vitals stable  -afebrile. Apparent new onset A. fib. with RVR. Chads vasc score 5. Initiated Cardizem drip in the ED.  Pt resting comfortably in ED. PE--physical exam shows left lower extremity shortened and externally  rotated. Labwork--d-dimer 14, troponin negative. EKG shows Imaging--plain films of left hip shows comminuted intertrochanteric fracture left femur. I personally reviewed the imaging and agree with the results as interpreted by the radiologist.  Consult to orthopedics, spoke hospitalist, Dr. Percell Miller, plan is for surgery on Thursday. Spoke with hospitalist, Erin Hearing will see in the ED. Will order a CT angiogram of chest. Consult cardiology, will be available to consult on this patient.  I discussed all relevant lab findings and imaging results with pt and they verbalized understanding. Prior to patient admission I discussed and reviewed this patient with my attending, Dr. Stark Jock.   Final diagnoses:  Fall, initial encounter        Comer Locket, PA-C 08/25/14 [place], MD 08/26/14 [phone]

## 2014-08-25 NOTE — Progress Notes (Signed)
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 The trachea was slightly anteriorly deviated but not compressed. The esophagus is deviated and compressed. She also has age indeterminate subtle fractures of the anterior aspects of the left fourth fifth and sixth ribs bilaterally. We'll check TSH and ultrasound of the neck focusing on the thyroid. Given compression on the esophagus was asked speech therapy to evaluate the patient as well regarding her swallowing.  Erin Hearing ,ANP

## 2014-08-25 NOTE — Progress Notes (Signed)
Utilization review completed. Donise Woodle, RN, BSN. 

## 2014-08-26 ENCOUNTER — Inpatient Hospital Stay (INDEPENDENT_AMBULATORY_CARE_PROVIDER_SITE_OTHER): Payer: Medicare Other

## 2014-08-26 DIAGNOSIS — R791 Abnormal coagulation profile: Secondary | ICD-10-CM

## 2014-08-26 DIAGNOSIS — E1165 Type 2 diabetes mellitus with hyperglycemia: Secondary | ICD-10-CM

## 2014-08-26 DIAGNOSIS — I4891 Unspecified atrial fibrillation: Secondary | ICD-10-CM | POA: Diagnosis not present

## 2014-08-26 DIAGNOSIS — I10 Essential (primary) hypertension: Secondary | ICD-10-CM

## 2014-08-26 DIAGNOSIS — E049 Nontoxic goiter, unspecified: Secondary | ICD-10-CM

## 2014-08-26 LAB — GLUCOSE, CAPILLARY
GLUCOSE-CAPILLARY: 121 mg/dL — AB (ref 65–99)
Glucose-Capillary: 128 mg/dL — ABNORMAL HIGH (ref 65–99)
Glucose-Capillary: 124 mg/dL — ABNORMAL HIGH (ref 65–99)
Glucose-Capillary: 143 mg/dL — ABNORMAL HIGH (ref 65–99)
Glucose-Capillary: 181 mg/dL — ABNORMAL HIGH (ref 65–99)

## 2014-08-26 LAB — BASIC METABOLIC PANEL
Anion gap: 8 (ref 5–15)
BUN: 19 mg/dL (ref 6–20)
CALCIUM: 8.6 mg/dL — AB (ref 8.9–10.3)
CO2: 24 mmol/L (ref 22–32)
Chloride: 105 mmol/L (ref 101–111)
Creatinine, Ser: 0.71 mg/dL (ref 0.44–1.00)
GFR calc non Af Amer: >60 mL/min (ref >60)
Glucose, Bld: 134 mg/dL — ABNORMAL HIGH (ref 65–99)
POTASSIUM: 4.3 mmol/L (ref 3.5–5.1)
SODIUM: 137 mmol/L (ref 135–145)

## 2014-08-26 LAB — LIPID PANEL
CHOL/HDL RATIO: 3.9 ratio
Cholesterol: 180 mg/dL (ref 0–200)
HDL: 46 mg/dL (ref >40)
LDL Cholesterol: 113 mg/dL — ABNORMAL HIGH (ref 0–99)
TRIGLYCERIDES: 107 mg/dL (ref <150)
VLDL: 21 mg/dL (ref 0–40)

## 2014-08-26 LAB — VITAMIN D 25 HYDROXY (VIT D DEFICIENCY, FRACTURES): Vit D, 25-Hydroxy: 23.7 ng/mL — ABNORMAL LOW (ref 30.0–100.0)

## 2014-08-26 LAB — CBC
HCT: 30.7 % — ABNORMAL LOW (ref 36.0–46.0)
HEMOGLOBIN: 10.3 g/dL — AB (ref 12.0–15.0)
MCH: 31.5 pg (ref 26.0–34.0)
MCHC: 33.6 g/dL (ref 30.0–36.0)
MCV: 93.9 fL (ref 78.0–100.0)
Platelets: 247 10*3/uL (ref 150–400)
RBC: 3.27 MIL/uL — AB (ref 3.87–5.11)
RDW: 12.5 % (ref 11.5–15.5)
WBC: 13.4 10*3/uL — ABNORMAL HIGH (ref 4.0–10.5)

## 2014-08-26 LAB — URINE CULTURE

## 2014-08-26 LAB — HEMOGLOBIN A1C
Hgb A1c MFr Bld: 6.6 % — ABNORMAL HIGH (ref 4.8–5.6)
MEAN PLASMA GLUCOSE: 143 mg/dL

## 2014-08-26 MED ORDER — WHITE PETROLATUM GEL
Status: AC
  Administered 2014-08-26: 21:00:00
  Filled 2014-08-26: qty 1

## 2014-08-26 NOTE — Progress Notes (Signed)
Orthopedic Tech Progress Note Patient Details:  Brittney Powell June 10, 1927 [id number]  Patient ID: Brittney Powell, female   DOB: February 27, 1927, 81 y.o.   MRN: [id number] Pt unable to use trapeze bar patient helper  Hildred Priest 08/26/2014, 6:17 AM

## 2014-08-26 NOTE — Progress Notes (Signed)
Speech Language Pathology Treatment: Dysphagia  Patient Details Name: Claritza July MRN: [id number] DOB: 1927-04-06 Today's Date: 08/26/2014 Time: 6720-9198 SLP Time Calculation (min) (ACUTE ONLY): 18 min  Assessment / Plan / Recommendation Clinical Impression  Pt seen with am meal. SLP provided repositioning, moderate verbal cues for aspiration and esophageal precautions. Provided education to pt and daughter who verbalized and return demonstrated understanding. Pt did not show any signs of aspiration with meal and is happy with texture of foods. No SLP f/u needed as education is complete.    HPI Other Pertinent Information: 81 yo female who presents from home s/p fall with hip fx. CTA shows a "huge hiatal hernia" as well as a "huge goiter" (10 x 6.4 x 6.4 cm) which results in slight anterior deviation of the trachea. The esophagus is deviated and compressed. PMH significant for rapidly progressive dementia, diabetes, hypertension, and dyslipidemia.   Pertinent Vitals    SLP Plan  All goals met    Recommendations Diet recommendations: Dysphagia 3 (mechanical soft);Thin liquid Liquids provided via: Straw;Cup Medication Administration: Whole meds with puree Supervision: Patient able to self feed Compensations: Slow rate;Small sips/bites;Follow solids with liquid Postural Changes and/or Swallow Maneuvers: Seated upright 90 degrees;Upright 30-60 min after meal;Out of bed for meals              Oral Care Recommendations: Oral care BID Plan: All goals met    GO    Herbie [place], MA CCC-SLP [phone]  Lynann Beaver 08/26/2014, 9:25 AM

## 2014-08-26 NOTE — Clinical Social Work Note (Signed)
Clinical Social Work Assessment  Patient Details  Name: Brittney Powell MRN: [id number] Date of Birth: March 10, 1927  Date of referral:  08/26/14               Reason for consult:  Facility Placement                Permission sought to share information with:  Family Supports Permission granted to share information::  Yes, Verbal Permission Granted  Name::     Brittney Powell   Relationship::  Husband   Housing/Transportation Living arrangements for the past 2 months:  Single Family Home Source of Information:  Spouse Patient Interpreter Needed:  None Criminal Activity/Legal Involvement Pertinent to Current Situation/Hospitalization:  No - Comment as needed Significant Relationships:  Spouse Lives with:  Spouse Do you feel safe going back to the place where you live?  No Need for family participation in patient care:  Yes (Comment)  Care giving concerns: N/A   Social Worker assessment / plan: CSW met the pt and pt's husband Brittney Powell at bedside. CSW introduced self and purpose of the visit. CSW and [name] discussed preliminary discharge plan. CSW explained the SNF process. CSW explained insurance and its relation to SNF placement. CSW provided [name] with a SNF list. CSW answered all questions in which the pt inquired about. CSW will continue to follow this pt and assist with discharge as needed.    Employment status:  Retired Nurse, adult PT Recommendations:  Not assessed at this time Information / Referral to community resources:  [place]  Patient/Family's Response to care:  [name] reported that the staff has cared for the pt well.   Patient/Family's Understanding of and Emotional Response to Diagnosis, Current Treatment, and Prognosis:  [name] reported that he will need to talk to the doctor to determine if the pt is strong enough to have surgery on her hip.   Emotional Assessment Appearance:  Appears stated age Attitude/Demeanor/Rapport:   (Calm  ) Affect (typically observed):  Pleasant Orientation:  Oriented to Self, Oriented to Situation Alcohol / Substance use:  Not Applicable Psych involvement (Current and /or in the community):  No (Comment)  Discharge Needs  Concerns to be addressed:  Denies Needs/Concerns at this time Readmission within the last 30 days:  No Current discharge risk:  None Barriers to Discharge:  No Barriers Identified   Oliveah Zwack, LCSW 08/26/2014, 11:12 AM

## 2014-08-26 NOTE — Progress Notes (Signed)
     Subjective:  S/P L hip fracture after a mechanical fall yesterday. Patient reports pain as mild to moderate.  Resting comfortably in bed.  Afib better controlled overnight.   Objective:   VITALS:   Filed Vitals:   08/26/14 0300 08/26/14 0400 08/26/14 0500 08/26/14 0545  BP: 123/100 111/55 111/85   Pulse: 98 85 88   Temp:    99.2 F (37.3 C)  TempSrc:    Oral  Resp: 17 16 16    Height:      Weight:      SpO2: 98% 98% 98%    Alert and oriented this morning but has some confusion at times due to dementia ABD soft Neurovascular intact Sensation intact distally Intact pulses distally Dorsiflexion/Plantar flexion intact   Lab Results  Component Value Date   WBC 13.4* 08/26/2014   HGB 10.3* 08/26/2014   HCT 30.7* 08/26/2014   MCV 93.9 08/26/2014   PLT 247 08/26/2014   BMET    Component Value Date/Time   NA 137 08/26/2014 0225   K 4.3 08/26/2014 0225   CL 105 08/26/2014 0225   CO2 24 08/26/2014 0225   GLUCOSE 134* 08/26/2014 0225   BUN 19 08/26/2014 0225   CREATININE 0.71 08/26/2014 0225   CALCIUM 8.6* 08/26/2014 0225   GFRNONAA >60 08/26/2014 0225   GFRAA >60 08/26/2014 0225     Assessment/Plan:     Principal Problem:   Displaced intertrochanteric fracture of left femur Active Problems:   Dementia   Diabetes mellitus type II, uncontrolled   Fall   Atrial fibrillation with RVR (new onset)   HTN (hypertension)   Elevated d-dimer   Left displaced femoral neck fracture   Goiter  Bedrest for now Plan to take to the OR 7/7 for surgical correction of L hip fracture   Brittney Powell 08/26/2014, 6:54 AM Cell [phone]

## 2014-08-26 NOTE — Progress Notes (Signed)
LVEF normal by echo. May proceed with surgery in AM from cardiac standpoint. Dayna Dunn PA-C

## 2014-08-26 NOTE — Progress Notes (Signed)
  Echocardiogram 2D Echocardiogram has been performed.  Jennette Dubin 08/26/2014, 10:31 AM

## 2014-08-26 NOTE — Progress Notes (Addendum)
Patient: Brittney Powell / Admit Date: 08/25/2014 / Date of Encounter: 08/26/2014, 10:10 AM   Subjective: C/o some residual hip pain. No CP or SOB.   Objective Telemetry: atrial fib, rates controlled. Only rare episode of HR going to 120s but nonsustained (? With movement or pain) - majority 70s-80s Physical Exam: Blood pressure 116/54, pulse 83, temperature 99.5 F (37.5 C), temperature source Oral, resp. rate 17, height 5\' 6"  (1.676 m), weight 122 lb 9.2 oz (55.6 kg), SpO2 97 %. General: Frail elderly WF in no acute distress. Head: Normocephalic, atraumatic, sclera non-icteric, no xanthomas, nares are without discharge. Neck: Negative for carotid bruits. JVD not elevated. Lungs: Clear bilaterally to auscultation without wheezes, rales, or rhonchi. Breathing is unlabored. Heart: Irregularly irregular, elevated rate, with S1 S2. No murmurs, rubs, or gallops appreciated. Abdomen: Soft, non-tender, non-distended with normoactive bowel sounds. No hepatomegaly. No rebound/guarding. No obvious abdominal masses. Extremities: No clubbing or cyanosis. No edema. Distal pedal pulses are 2+ and equal bilaterally. Neuro: Alert and oriented to self only, pleasantly demented.   Intake/Output Summary (Last 24 hours) at 08/26/14 1010 Last data filed at 08/26/14 0600  Gross per 24 hour  Intake 1687.26 ml  Output      0 ml  Net 1687.26 ml    Inpatient Medications:  . [START ON 08/27/2014] acetaminophen  1,000 mg Oral Once  . aspirin EC  81 mg Oral Daily  . [START ON 08/27/2014]  ceFAZolin (ANCEF) IV  2 g Intravenous To SS-Surg  . docusate sodium  100 mg Oral BID  . donepezil  10 mg Oral QHS  . enoxaparin (LOVENOX) injection  40 mg Subcutaneous Q24H  . ezetimibe  10 mg Oral Daily  . insulin aspart  0-9 Units Subcutaneous 6 times per day  . memantine  28 mg Oral Daily  . PARoxetine  20 mg Oral Daily   Infusions:  . sodium chloride 100 mL/hr at 08/25/14 2145  . diltiazem (CARDIZEM) infusion 5  mg/hr (08/26/14 0600)    Labs:  Recent Labs  08/25/14 0937 08/25/14 1537 08/26/14 0225  NA 137  --  137  K 4.1  --  4.3  CL 102  --  105  CO2 26  --  24  GLUCOSE 244*  --  134*  BUN 18  --  19  CREATININE 0.77  --  0.71  CALCIUM 8.8* 8.8* 8.6*    Recent Labs  08/25/14 1537  ALBUMIN 3.5    Recent Labs  08/25/14 0937 08/26/14 0225  WBC 13.8* 13.4*  NEUTROABS 8.2*  --   HGB 12.5 10.3*  HCT 36.4 30.7*  MCV 95.3 93.9  PLT 239 247   No results for input(s): CKTOTAL, CKMB, TROPONINI in the last 72 hours. Invalid input(s): POCBNP  Recent Labs  08/25/14 1054  HGBA1C 6.6*     Radiology/Studies:  Ct Angio Chest Pe W/cm &/or Wo Cm  08/25/2014   CLINICAL DATA:  Elevated D-dimer.  Patient fell yesterday.  EXAM: CT ANGIOGRAPHY CHEST WITH CONTRAST  TECHNIQUE: Multidetector CT imaging of the chest was performed using the standard protocol during bolus administration of intravenous contrast. Multiplanar CT image reconstructions and MIPs were obtained to evaluate the vascular anatomy.  CONTRAST:  17mL OMNIPAQUE IOHEXOL 350 MG/ML SOLN  COMPARISON:  None.  FINDINGS: There are fractures of the anterior lateral aspects of the left fourth, fifth and sixth ribs bilaterally, age indeterminate.  The patient has a huge goiter that extends substernal to the level of the carina  measuring 10 x 6.4 x 6.4 cm. The trachea is slightly anteriorly deviated but not compressed. The esophagus is deviated and compressed.  There are no pulmonary emboli, infiltrates, or effusions. The patient has a large hiatal hernia containing the entire fundus of the stomach. There is secondary compressive atelectasis in the left lower lobe. There is chronic accentuation of the thoracic kyphosis. Extensive calcification thoracic aorta with moderate coronary artery calcification. Far all heart size is normal. The visualized portion of the upper abdomen is normal except for aortic atherosclerosis.  Review of the MIP images  confirms the above findings.  IMPRESSION: 1. No pulmonary emboli or other acute abnormalities of the chest. 2. Huge substernal goiter. 3. Huge hiatal hernia which create secondary compressive atelectasis in the left lower lobe. 4. Subtle fractures of the anterior aspects of the left fourth, fifth and sixth ribs bilaterally, age indeterminate.   Electronically Signed   By: Lorriane Shire M.D.   On: 08/25/2014 12:06   US Soft Tissue Head/neck  08/25/2014   CLINICAL DATA:  Assess thyroid masses as noted on CT. Initial encounter.  EXAM: THYROID ULTRASOUND  TECHNIQUE: Ultrasound examination of the thyroid gland and adjacent soft tissues was performed.  COMPARISON:  CTA of the chest performed earlier today at 11:43 a.m.  FINDINGS: Right thyroid lobe  Measurements: 6.1 x 3.2 x 3.0 cm.  Multiple nodules are seen.  Left thyroid lobe  Measurements: 7.3 x 3.7 x 2.8 cm. Multiple nodules are seen, with diffuse heterogeneity.  Isthmus  Thickness: 0.4 cm.  Grossly unremarkable in appearance.  Lymphadenopathy  None visualized.  The inferior aspect of the thyroid gland, including the largest masses noted on CTA, is not visualized on this study.  IMPRESSION: Bilateral thyroid nodules, larger on the left. The largest thyroid nodules, measuring up to 4.6 cm at the inferior aspect of the thyroid gland, are not well assessed on this study. Would correlate with thyroid function tests and consider nuclear medicine thyroid uptake and scan, as deemed clinically appropriate.   Electronically Signed   By: Garald Balding M.D.   On: 08/25/2014 20:27   Chest Portable 1 View  08/25/2014   CLINICAL DATA:  Preoperative examination prior to surgery on the left hip and femur, history of goiter, breast malignancy, and diabetes.  EXAM: PORTABLE CHEST - 1 VIEW  COMPARISON:  CT scan of the chest of August 25, 2014 chest x-ray of April 13, 2004.  FINDINGS: The right lung is mildly hyperinflated and clear. On the left there is subtle increased density  that is at least in part due to a known large hiatal hernia-partially intra thoracic stomach. There is no significant pleural effusion. The heart is top-normal in size. The pulmonary vascularity is normal. Soft tissue density in the paratracheal regions bilaterally is consistent with the known large goiter. The observed bony thorax exhibits no acute abnormality. The known left lateral rib fractures are not evident on today's study.  IMPRESSION: There is no acute cardiopulmonary abnormality. There is a known large hiatal hernia -partially intra thoracic stomach and a large goiter.   Electronically Signed   By: David  Martinique M.D.   On: 08/25/2014 14:41   Dg Hip Unilat With Pelvis 2-3 Views Left  08/25/2014   CLINICAL DATA:  Hip pain, confusion  EXAM: LEFT HIP (WITH PELVIS) 2-3 VIEWS  COMPARISON:  None  FINDINGS: Osseous demineralization.  Displaced intertrochanteric fracture LEFT femur.  Narrowing of both hip joints without dislocation.  SI joints symmetric.  Atherosclerotic calcification  aorta.  Pessary projects over pelvis.  IMPRESSION: Osseous demineralization with displaced intertrochanteric fracture LEFT femur.   Electronically Signed   By: Lavonia Dana M.D.   On: 08/25/2014 09:58     Assessment and Plan  1. Recurrent fall with resultant left hip fracture - ortho plans for surgery tentatively on 7/7 AM. From a preoperative standpoint she denies any recent anginal symptoms. However, she is generally inactive due to limited mobility so it is unknown if she has had any concerning exertional symptoms. Pre-op echo being done this AM.  2. Newly recognized atrial fibrillation with RVR (unknown chronicity) - rates improved on IV diltiazem. We were going to add metoprolol yesterday but her HR came down to 70s-80s on IV diltiazem and pain control. Since she will be NPO soon for her surgery, will continue IV diltiazem. We can change this to oral form post-operatively. If LV dysfunction is noted on echocardiogram,  would opt for BB therapy instead. Discussed anticoagulation yesterday with MD - not an easy situation. Clearly at risk for recurrent CVAs but also at risk for falling. He favors a trial of low dose Eliquis once cleared from surgery. Since she is asymptomatic from this, a rate control strategy is acceptable for now.  3. Essential hypertension - hold lisinopril to allow for titration of rate-controlling agents if needed.  4. Diabetes mellitus - A1c 6.6 She is on SSI.   5. Incidental findings of huge thyroid goiter creating esophageal compression, and huge hiatal hernia leading to atelectasis of LLL (?source of hypoxia) - TSH normal. Further per IM.  Signed, Melina Copa PA-C Pager: [phone] As above; patient seen and examined; no chest pain or dyspnea; patient remains in atrial fibrillation; continue IV cardizem for rate control; will transition to oral formulation following surgery; will begin apixaban postoperatively as outlined by Dr Acie Fredrickson (risk and benefit discussed previously); await echo; if LV function normal, may proceed with surgery in AM. Kirk Ruths

## 2014-08-26 NOTE — Progress Notes (Signed)
[facility] PROGRESS NOTE  [name] ZSM:[id number] DOB: 1927/10/24 DOA: 08/25/2014 PCP: [name], MD  Assessment/Plan: 1. Left hip fracture.  -Patient with history of dementia, unsteady gait, having a mechanical fall at home well getting out of bed to use restroom. -She was found to be in 8 or fibrillation rapid ventricular response overnight for which cardiology was consulted. -A. fib now controlled, transthoracic echocardiogram showing preserved ejection fraction of 65%.  -Patient cleared from a cardiac standpoint to undergo orthopedic surgery in a.m.  2.  Atrial fibrillation rapid ventricular response -This represents newly recognized atrial fibrillation, unknown chronicity.  -Cardiology following, recommending continuing diltiazem IV with plans to transition to by mouth postoperatively. Transthoracic echocardiogram showing preserved ejection fraction without wall motion abnormalities. Will likely be placed on anticoagulation postoperatively.   3.  Goiter -Having an incidental substernal goiter found on CT scan of lungs -This was further evaluated with thyroid ultrasound that showed multiple bilateral thyroid nodules -TSH and free T4 within normal limits -I discussed case with Dr. [name] of general surgery, who evaluated imaging studies and recommended further workup in the outpatient setting. Patient will likely undergo fine-needle aspiration in the office.  4.  Elevated d-dimer. -CT scan of lungs with contrast negative for pulmonary embolism  5.  Hypertension -Patient started on a Cardizem drip as oral anti-hypertensive agents were held  Code Status: Full code Family Communication: I spoke with family members present at bedside Disposition Plan: It is free discharge to skilled nursing facility when medically stable   Consultants:  Orthopedic surgery  Cardiology   HPI/Subjective: Patient is a pleasant 81 year old female with a past medical history of  hypertension, dyslipidemia, diabetes mellitus, admitted to medicine service on 08/25/2014 after having a fall at home. Patient reporting getting out of bed to use the restroom lost her balance and fell backwards. Imaging studies revealed the presence of a left hip fracture. In the emergency department shows found to be in atrial fibrillation with rapid ventricular response and started on a Cardizem drip. A CT scan of chest with IV contrast was negative for pulmonary emboli or any other acute abdomen abnormalities of the chest. Cardiology was consulted. 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 Endocrine workup showed a TSH within normal limits at 0.457 and a free T4 of 0.84. Case was discussed with Dr. [name] of general surgery who recommended further outpatient workup. He would likely obtain fine-needle biopsy.   Objective: Filed Vitals:   08/26/14 1500  BP:   Pulse:   Temp: 98.2 F (36.8 C)  Resp:     Intake/Output Summary (Last 24 hours) at 08/26/14 1813 Last data filed at 08/26/14 1652  Gross per 24 hour  Intake 2287.26 ml  Output     50 ml  Net 2237.26 ml   Filed Weights   08/25/14 1414  Weight: 55.6 kg (122 lb 9.2 oz)    Exam:   General:  Patient does not appear to be in acute distress, she is confused, disoriented, poor historian  Cardiovascular: Irregular rate and rhythm, normal S1-S2, tachycardic  Respiratory: Normal respiratory effort, lungs overall clear to auscultation bilaterally  Abdomen: Soft nontender nondistended  Musculoskeletal: Patient having pain with passive and active movement of left lower extremity, no extremity edema  Data Reviewed: Basic Metabolic Panel:  Recent Labs Lab 08/25/14 0937 08/25/14 1537 08/26/14 0225  NA 137  --  137  K 4.1  --  4.3  CL 102  --  105  CO2 26  --  24  GLUCOSE 244*  --  134*  BUN 18  --  19  CREATININE 0.77  --  0.71  CALCIUM 8.8* 8.8* 8.6*   Liver Function Tests:  Recent Labs Lab  08/25/14 1537  ALBUMIN 3.5   No results for input(s): LIPASE, AMYLASE in the last 168 hours. No results for input(s): AMMONIA in the last 168 hours. CBC:  Recent Labs Lab 08/25/14 0937 08/26/14 0225  WBC 13.8* 13.4*  NEUTROABS 8.2*  --   HGB 12.5 10.3*  HCT 36.4 30.7*  MCV 95.3 93.9  PLT 239 247   Cardiac Enzymes: No results for input(s): CKTOTAL, CKMB, CKMBINDEX, TROPONINI in the last 168 hours. BNP (last 3 results) No results for input(s): BNP in the last 8760 hours.  ProBNP (last 3 results) No results for input(s): PROBNP in the last 8760 hours.  CBG:  Recent Labs Lab 08/25/14 2324 08/26/14 0538 08/26/14 0834 08/26/14 1226 08/26/14 1515  GLUCAP 136* 128* 121* 124* 143*    Recent Results (from the past 240 hour(s))  Urine culture     Status: None   Collection Time: 08/25/14 12:40 PM  Result Value Ref Range Status   Specimen Description URINE, CLEAN CATCH  Final   Special Requests NONE  Final   Culture   Final    MULTIPLE SPECIES PRESENT, SUGGEST RECOLLECTION IF CLINICALLY INDICATED   Report Status 08/26/2014 FINAL  Final     Studies: Ct Angio Chest Pe W/cm &/or Wo Cm  08/25/2014   CLINICAL DATA:  Elevated D-dimer.  Patient fell yesterday.  EXAM: CT ANGIOGRAPHY CHEST WITH CONTRAST  TECHNIQUE: Multidetector CT imaging of the chest was performed using the standard protocol during bolus administration of intravenous contrast. Multiplanar CT image reconstructions and MIPs were obtained to evaluate the vascular anatomy.  CONTRAST:  44mL OMNIPAQUE IOHEXOL 350 MG/ML SOLN  COMPARISON:  None.  FINDINGS: There are fractures of the anterior lateral aspects of the left fourth, fifth and sixth ribs bilaterally, age indeterminate.  The patient has a huge goiter that extends substernal to the level of the carina measuring 10 x 6.4 x 6.4 cm. The trachea is slightly anteriorly deviated but not compressed. The esophagus is deviated and compressed.  There are no pulmonary emboli,  infiltrates, or effusions. The patient has a large hiatal hernia containing the entire fundus of the stomach. There is secondary compressive atelectasis in the left lower lobe. There is chronic accentuation of the thoracic kyphosis. Extensive calcification thoracic aorta with moderate coronary artery calcification. Far all heart size is normal. The visualized portion of the upper abdomen is normal except for aortic atherosclerosis.  Review of the MIP images confirms the above findings.  IMPRESSION: 1. No pulmonary emboli or other acute abnormalities of the chest. 2. Huge substernal goiter. 3. Huge hiatal hernia which create secondary compressive atelectasis in the left lower lobe. 4. Subtle fractures of the anterior aspects of the left fourth, fifth and sixth ribs bilaterally, age indeterminate.   Electronically Signed   By: Lorriane Shire M.D.   On: 08/25/2014 12:06   US Soft Tissue Head/neck  08/25/2014   CLINICAL DATA:  Assess thyroid masses as noted on CT. Initial encounter.  EXAM: THYROID ULTRASOUND  TECHNIQUE: Ultrasound examination of the thyroid gland and adjacent soft tissues was performed.  COMPARISON:  CTA of the chest performed earlier today at 11:43 a.m.  FINDINGS: Right thyroid lobe  Measurements: 6.1 x 3.2 x 3.0 cm.  Multiple nodules are seen.  Left thyroid lobe  Measurements: 7.3 x 3.7 x 2.8 cm. Multiple nodules are seen, with diffuse heterogeneity.  Isthmus  Thickness: 0.4 cm.  Grossly unremarkable in appearance.  Lymphadenopathy  None visualized.  The inferior aspect of the thyroid gland, including the largest masses noted on CTA, is not visualized on this study.  IMPRESSION: Bilateral thyroid nodules, larger on the left. The largest thyroid nodules, measuring up to 4.6 cm at the inferior aspect of the thyroid gland, are not well assessed on this study. Would correlate with thyroid function tests and consider nuclear medicine thyroid uptake and scan, as deemed clinically appropriate.    Electronically Signed   By: Garald Balding M.D.   On: 08/25/2014 20:27   Chest Portable 1 View  08/25/2014   CLINICAL DATA:  Preoperative examination prior to surgery on the left hip and femur, history of goiter, breast malignancy, and diabetes.  EXAM: PORTABLE CHEST - 1 VIEW  COMPARISON:  CT scan of the chest of August 25, 2014 chest x-ray of April 13, 2004.  FINDINGS: The right lung is mildly hyperinflated and clear. On the left there is subtle increased density that is at least in part due to a known large hiatal hernia-partially intra thoracic stomach. There is no significant pleural effusion. The heart is top-normal in size. The pulmonary vascularity is normal. Soft tissue density in the paratracheal regions bilaterally is consistent with the known large goiter. The observed bony thorax exhibits no acute abnormality. The known left lateral rib fractures are not evident on today's study.  IMPRESSION: There is no acute cardiopulmonary abnormality. There is a known large hiatal hernia -partially intra thoracic stomach and a large goiter.   Electronically Signed   By: David  Martinique M.D.   On: 08/25/2014 14:41   Dg Hip Unilat With Pelvis 2-3 Views Left  08/25/2014   CLINICAL DATA:  Hip pain, confusion  EXAM: LEFT HIP (WITH PELVIS) 2-3 VIEWS  COMPARISON:  None  FINDINGS: Osseous demineralization.  Displaced intertrochanteric fracture LEFT femur.  Narrowing of both hip joints without dislocation.  SI joints symmetric.  Atherosclerotic calcification aorta.  Pessary projects over pelvis.  IMPRESSION: Osseous demineralization with displaced intertrochanteric fracture LEFT femur.   Electronically Signed   By: Lavonia Dana M.D.   On: 08/25/2014 09:58    Scheduled Meds: . [START ON 08/27/2014] acetaminophen  1,000 mg Oral Once  . aspirin EC  81 mg Oral Daily  . [START ON 08/27/2014]  ceFAZolin (ANCEF) IV  2 g Intravenous To SS-Surg  . docusate sodium  100 mg Oral BID  . donepezil  10 mg Oral QHS  . enoxaparin  (LOVENOX) injection  40 mg Subcutaneous Q24H  . ezetimibe  10 mg Oral Daily  . insulin aspart  0-9 Units Subcutaneous 6 times per day  . memantine  28 mg Oral Daily  . PARoxetine  20 mg Oral Daily   Continuous Infusions: . diltiazem (CARDIZEM) infusion 5 mg/hr (08/26/14 1652)    Principal Problem:   Displaced intertrochanteric fracture of left femur Active Problems:   Dementia   Diabetes mellitus type II, uncontrolled   Fall   Atrial fibrillation with RVR (new onset)   HTN (hypertension)   Elevated d-dimer   Left displaced femoral neck fracture   Goiter    Time spent: 35 min    Kelvin Cellar  [facility] Pager [phone]. If 7PM-7AM, please contact night-coverage at [email or web address], password [facility] 08/26/2014, 6:13 PM  LOS: 1 day

## 2014-08-27 ENCOUNTER — Inpatient Hospital Stay (HOSPITAL_COMMUNITY): Payer: Medicare Other | Admitting: Anesthesiology

## 2014-08-27 ENCOUNTER — Encounter (HOSPITAL_COMMUNITY): Payer: Self-pay | Admitting: Certified Registered"

## 2014-08-27 ENCOUNTER — Inpatient Hospital Stay (HOSPITAL_COMMUNITY): Payer: Medicare Other

## 2014-08-27 ENCOUNTER — Encounter (HOSPITAL_COMMUNITY)
Admission: EM | Disposition: A | Discharge status: Skilled Nursing Facility | Payer: Self-pay | Source: Home / Self Care | Attending: Internal Medicine

## 2014-08-27 DIAGNOSIS — E049 Nontoxic goiter, unspecified: Secondary | ICD-10-CM | POA: Insufficient documentation

## 2014-08-27 DIAGNOSIS — F0390 Unspecified dementia without behavioral disturbance: Secondary | ICD-10-CM

## 2014-08-27 DIAGNOSIS — E040 Nontoxic diffuse goiter: Secondary | ICD-10-CM | POA: Insufficient documentation

## 2014-08-27 HISTORY — PX: INTRAMEDULLARY (IM) NAIL INTERTROCHANTERIC: SHX5875

## 2014-08-27 LAB — BASIC METABOLIC PANEL
ANION GAP: 7 (ref 5–15)
BUN: 18 mg/dL (ref 6–20)
CHLORIDE: 106 mmol/L (ref 101–111)
CO2: 25 mmol/L (ref 22–32)
Calcium: 8.5 mg/dL — ABNORMAL LOW (ref 8.9–10.3)
Creatinine, Ser: 0.61 mg/dL (ref 0.44–1.00)
GFR calc Af Amer: >60 mL/min (ref >60)
GFR calc non Af Amer: >60 mL/min (ref >60)
Glucose, Bld: 116 mg/dL — ABNORMAL HIGH (ref 65–99)
Potassium: 3.8 mmol/L (ref 3.5–5.1)
Sodium: 138 mmol/L (ref 135–145)

## 2014-08-27 LAB — CBC
HCT: 28.2 % — ABNORMAL LOW (ref 36.0–46.0)
HEMOGLOBIN: 9.4 g/dL — AB (ref 12.0–15.0)
MCH: 31.9 pg (ref 26.0–34.0)
MCHC: 33.3 g/dL (ref 30.0–36.0)
MCV: 95.6 fL (ref 78.0–100.0)
PLATELETS: 204 10*3/uL (ref 150–400)
RBC: 2.95 MIL/uL — ABNORMAL LOW (ref 3.87–5.11)
RDW: 12.8 % (ref 11.5–15.5)
WBC: 10.6 10*3/uL — ABNORMAL HIGH (ref 4.0–10.5)

## 2014-08-27 LAB — GLUCOSE, CAPILLARY
GLUCOSE-CAPILLARY: 122 mg/dL — AB (ref 65–99)
Glucose-Capillary: 131 mg/dL — ABNORMAL HIGH (ref 65–99)
Glucose-Capillary: 133 mg/dL — ABNORMAL HIGH (ref 65–99)
Glucose-Capillary: 107 mg/dL — ABNORMAL HIGH (ref 65–99)
Glucose-Capillary: 176 mg/dL — ABNORMAL HIGH (ref 65–99)
Glucose-Capillary: 166 mg/dL — ABNORMAL HIGH (ref 65–99)

## 2014-08-27 LAB — SURGICAL PCR SCREEN
MRSA, PCR: NEGATIVE
Staphylococcus aureus: NEGATIVE

## 2014-08-27 SURGERY — FIXATION, FRACTURE, INTERTROCHANTERIC, WITH INTRAMEDULLARY ROD
Anesthesia: General | Site: Hip | Laterality: Left

## 2014-08-27 MED ORDER — SODIUM CHLORIDE 0.9 % IV SOLN
INTRAVENOUS | Status: DC | PRN
  Administered 2014-08-27: 10:00:00 via INTRAVENOUS

## 2014-08-27 MED ORDER — FENTANYL CITRATE (PF) 100 MCG/2ML IJ SOLN
INTRAMUSCULAR | Status: DC | PRN
  Administered 2014-08-27 (×2): 50 ug via INTRAVENOUS

## 2014-08-27 MED ORDER — CEFAZOLIN SODIUM-DEXTROSE 2-3 GM-% IV SOLR
2.0000 g | Freq: Four times a day (QID) | INTRAVENOUS | Status: AC
  Administered 2014-08-27 (×2): 2 g via INTRAVENOUS
  Filled 2014-08-27 (×2): qty 50

## 2014-08-27 MED ORDER — PROPOFOL 10 MG/ML IV BOLUS
INTRAVENOUS | Status: DC | PRN
  Administered 2014-08-27: 50 mg via INTRAVENOUS

## 2014-08-27 MED ORDER — SUCCINYLCHOLINE CHLORIDE 20 MG/ML IJ SOLN
INTRAMUSCULAR | Status: DC | PRN
  Administered 2014-08-27: 80 mg via INTRAVENOUS

## 2014-08-27 MED ORDER — ASPIRIN 325 MG PO TABS: 325.0000 mg | ORAL_TABLET | Freq: Every day | ORAL | Status: DC

## 2014-08-27 MED ORDER — HYDROCODONE-ACETAMINOPHEN 5-325 MG PO TABS: 1.0000 | ORAL_TABLET | Freq: Four times a day (QID) | ORAL | Status: DC | PRN

## 2014-08-27 MED ORDER — 0.9 % SODIUM CHLORIDE (POUR BTL) OPTIME
TOPICAL | Status: DC | PRN
  Administered 2014-08-27: 1000 mL

## 2014-08-27 MED ORDER — ONDANSETRON HCL 4 MG PO TABS: 4.0000 mg | ORAL_TABLET | Freq: Four times a day (QID) | ORAL | Status: DC | PRN

## 2014-08-27 MED ORDER — METOCLOPRAMIDE HCL 5 MG/ML IJ SOLN
5.0000 mg | Freq: Three times a day (TID) | INTRAMUSCULAR | Status: DC | PRN
  Filled 2014-08-27: qty 2

## 2014-08-27 MED ORDER — LIDOCAINE HCL (CARDIAC) 20 MG/ML IV SOLN
INTRAVENOUS | Status: DC | PRN
  Administered 2014-08-27: 60 mg via INTRAVENOUS

## 2014-08-27 MED ORDER — PROMETHAZINE HCL 25 MG/ML IJ SOLN: 6.2500 mg | INTRAMUSCULAR | Status: DC | PRN

## 2014-08-27 MED ORDER — METOCLOPRAMIDE HCL 5 MG PO TABS
5.0000 mg | ORAL_TABLET | Freq: Three times a day (TID) | ORAL | Status: DC | PRN
  Filled 2014-08-27: qty 2

## 2014-08-27 MED ORDER — FENTANYL CITRATE (PF) 250 MCG/5ML IJ SOLN
INTRAMUSCULAR | Status: AC
  Filled 2014-08-27: qty 5

## 2014-08-27 MED ORDER — PHENYLEPHRINE HCL 10 MG/ML IJ SOLN
INTRAMUSCULAR | Status: DC | PRN
  Administered 2014-08-27 (×2): 80 ug via INTRAVENOUS

## 2014-08-27 MED ORDER — LACTATED RINGERS IV SOLN
INTRAVENOUS | Status: DC
  Administered 2014-08-27: 50 mL/h via INTRAVENOUS
  Administered 2014-08-28: 08:00:00 via INTRAVENOUS

## 2014-08-27 MED ORDER — ONDANSETRON HCL 4 MG/2ML IJ SOLN
4.0000 mg | Freq: Four times a day (QID) | INTRAMUSCULAR | Status: DC | PRN
  Administered 2014-08-27 – 2014-08-28 (×2): 4 mg via INTRAVENOUS
  Filled 2014-08-27 (×2): qty 2

## 2014-08-27 MED ORDER — FENTANYL CITRATE (PF) 100 MCG/2ML IJ SOLN: 25.0000 ug | INTRAMUSCULAR | Status: DC | PRN

## 2014-08-27 MED ORDER — ONDANSETRON HCL 4 MG PO TABS: 4.0000 mg | ORAL_TABLET | Freq: Three times a day (TID) | ORAL | Status: AC | PRN

## 2014-08-27 MED ORDER — MENTHOL 3 MG MT LOZG
1.0000 | LOZENGE | OROMUCOSAL | Status: DC | PRN
  Filled 2014-08-27: qty 9

## 2014-08-27 MED ORDER — PHENOL 1.4 % MT LIQD: 1.0000 | OROMUCOSAL | Status: DC | PRN

## 2014-08-27 MED ORDER — DOCUSATE SODIUM 100 MG PO CAPS: 100.0000 mg | ORAL_CAPSULE | Freq: Two times a day (BID) | ORAL | Status: DC

## 2014-08-27 MED ORDER — CEFAZOLIN SODIUM-DEXTROSE 2-3 GM-% IV SOLR
2.0000 g | Freq: Four times a day (QID) | INTRAVENOUS | Status: DC
  Filled 2014-08-27: qty 50

## 2014-08-27 MED ORDER — ASPIRIN EC 325 MG PO TBEC
325.0000 mg | DELAYED_RELEASE_TABLET | Freq: Every day | ORAL | Status: DC
  Administered 2014-08-28: 325 mg via ORAL
  Filled 2014-08-27: qty 1

## 2014-08-27 MED ORDER — PROPOFOL 10 MG/ML IV BOLUS
INTRAVENOUS | Status: AC
  Filled 2014-08-27: qty 20

## 2014-08-27 SURGICAL SUPPLY — 44 items
BIT DRILL AO GAMMA 4.2X130 (BIT) ×3 IMPLANT
BNDG COHESIVE 4X5 TAN STRL (GAUZE/BANDAGES/DRESSINGS) IMPLANT
BNDG GAUZE ELAST 4 BULKY (GAUZE/BANDAGES/DRESSINGS) IMPLANT
COVER PERINEAL POST (MISCELLANEOUS) ×3 IMPLANT
COVER SURGICAL LIGHT HANDLE (MISCELLANEOUS) ×3 IMPLANT
DRAPE STERI IOBAN 125X83 (DRAPES) ×3 IMPLANT
DRSG MEPILEX BORDER 4X4 (GAUZE/BANDAGES/DRESSINGS) ×3 IMPLANT
DURAPREP 26ML APPLICATOR (WOUND CARE) ×3 IMPLANT
ELECT REM PT RETURN 9FT ADLT (ELECTROSURGICAL) ×3
ELECTRODE REM PT RTRN 9FT ADLT (ELECTROSURGICAL) ×1 IMPLANT
GLOVE BIO SURGEON STRL SZ7 (GLOVE) ×6 IMPLANT
GLOVE BIO SURGEON STRL SZ7.5 (GLOVE) ×6 IMPLANT
GLOVE BIOGEL PI IND STRL 6.5 (GLOVE) ×1 IMPLANT
GLOVE BIOGEL PI IND STRL 7.0 (GLOVE) ×3 IMPLANT
GLOVE BIOGEL PI IND STRL 8 (GLOVE) ×4 IMPLANT
GLOVE BIOGEL PI INDICATOR 6.5 (GLOVE) ×2
GLOVE BIOGEL PI INDICATOR 7.0 (GLOVE) ×6
GLOVE BIOGEL PI INDICATOR 8 (GLOVE) ×8
GLOVE SURG SS PI 6.5 STRL IVOR (GLOVE) ×3 IMPLANT
GOWN STRL REUS W/ TWL LRG LVL3 (GOWN DISPOSABLE) ×2 IMPLANT
GOWN STRL REUS W/TWL LRG LVL3 (GOWN DISPOSABLE) ×4
GUIDEROD T2 3X1000 (ROD) ×3 IMPLANT
K-WIRE  3.2X450M STR (WIRE) ×2
K-WIRE 3.2X450M STR (WIRE) ×1
KIT NAIL LONG 10X380MM (Orthopedic Implant) ×3 IMPLANT
KIT ROOM TURNOVER OR (KITS) ×3 IMPLANT
KWIRE 3.2X450M STR (WIRE) ×1 IMPLANT
MANIFOLD NEPTUNE II (INSTRUMENTS) IMPLANT
NS IRRIG 1000ML POUR BTL (IV SOLUTION) ×3 IMPLANT
PACK GENERAL/GYN (CUSTOM PROCEDURE TRAY) ×3 IMPLANT
PAD ARMBOARD 7.5X6 YLW CONV (MISCELLANEOUS) ×3 IMPLANT
PAD CAST 4YDX4 CTTN HI CHSV (CAST SUPPLIES) IMPLANT
PADDING CAST COTTON 4X4 STRL (CAST SUPPLIES)
SCREW LAG GAMMA 3 TI 10.5X90MM (Screw) ×3 IMPLANT
SCREW LOCKING T2 F/T  5X37.5MM (Screw) ×2 IMPLANT
SCREW LOCKING T2 F/T 5X37.5MM (Screw) ×1 IMPLANT
STAPLER VISISTAT 35W (STAPLE) IMPLANT
SUT MNCRL AB 4-0 PS2 18 (SUTURE) ×3 IMPLANT
SUT MON AB 2-0 CT1 27 (SUTURE) ×3 IMPLANT
SUT VIC AB 0 CT1 27 (SUTURE) ×2
SUT VIC AB 0 CT1 27XBRD ANBCTR (SUTURE) ×1 IMPLANT
SUT VIC AB 2-0 CT1 36 (SUTURE) ×3 IMPLANT
TOWEL OR 17X24 6PK STRL BLUE (TOWEL DISPOSABLE) ×3 IMPLANT
TOWEL OR 17X26 10 PK STRL BLUE (TOWEL DISPOSABLE) ×3 IMPLANT

## 2014-08-27 NOTE — Progress Notes (Signed)
[facility] PROGRESS NOTE  [name] QIW:[id number] DOB: [birth year] DOA: 08/25/2014 PCP: [name], MD  Assessment/Plan: 1. Left hip fracture.  -Patient with history of dementia, unsteady gait, having a mechanical fall at home well getting out of bed to use restroom. -She was found to be in atrial fibrillation with rapid ventricular response for which cardiology was consulted. -A. fib now controlled, transthoracic echocardiogram showing preserved ejection fraction of 65%.  -On 08/27/2014 she was taken to the OR for intramedullary nail placement of left hip fracture.   2.  Atrial fibrillation rapid ventricular response -This represents newly recognized atrial fibrillation, unknown chronicity.  -Cardiology following, recommending continuing diltiazem IV with plans to transition to by mouth postoperatively. Transthoracic echocardiogram showing preserved ejection fraction without wall motion abnormalities. Will likely be placed on anticoagulation postoperatively.   3.  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 [name] of general surgery, who reviewed imaging studies and recommended further workup in the outpatient setting, including FNA. She does not appear to be having issues with swallowing. Satting upper 90's on room air.    4.  Elevated d-dimer. -CT scan of lungs with contrast negative for pulmonary embolism  5.  Hypertension -Patient started on a Cardizem drip as oral anti-hypertensive agents were held -Blood pressures stable  Code Status: Full code Family Communication: I spoke with family members present at bedside Disposition Plan: SNF placement for rehab   Consultants:  Orthopedic surgery  Cardiology   HPI/Subjective: Patient is a pleasant 81 year old  female with a past medical history of hypertension, dyslipidemia, diabetes mellitus, admitted to medicine service on 08/25/2014 after having a fall at home. Patient reporting getting out of bed to use the restroom lost her balance and fell backwards. Imaging studies revealed the presence of a left hip fracture. In the emergency department shows found to be in atrial fibrillation with rapid ventricular response and started on a Cardizem drip. A CT scan of chest with IV contrast was negative for pulmonary emboli or any other acute abdomen abnormalities of the chest. Cardiology was consulted. 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 Endocrine workup showed a TSH within normal limits at 0.457 and a free T4 of 0.84. Case was discussed with Dr. [name] of general surgery who recommended further outpatient workup. He would likely obtain fine-needle biopsy.   Objective: Filed Vitals:   08/27/14 1202  BP: 106/89  Pulse: 91  Temp: 97.6 F (36.4 C)  Resp: 15    Intake/Output Summary (Last 24 hours) at 08/27/14 1607 Last data filed at 08/27/14 1500  Gross per 24 hour  Intake   1030 ml  Output    200 ml  Net    830 ml   Filed Weights   08/25/14 1414 08/27/14 0452  Weight: 55.6 kg (122 lb 9.2 oz) 56.4 kg (124 lb 5.4 oz)    Exam:   General:  Patient does not appear to be in acute distress, she is confused, disoriented, poor historian  Cardiovascular: Irregular rate and rhythm, normal S1-S2, tachycardic  Respiratory: Normal respiratory effort, lungs overall clear to auscultation bilaterally  Abdomen: Soft nontender nondistended  Musculoskeletal: Patient having pain with passive and active movement of left lower extremity, no extremity edema  Data Reviewed: Basic Metabolic Panel:  Recent Labs Lab 08/25/14 0937 08/25/14 1537 08/26/14  0225 08/27/14 0214  NA 137  --  137 138  K 4.1  --  4.3 3.8  CL 102  --  105 106  CO2 26  --  24 25  GLUCOSE 244*  --  134* 116*   BUN 18  --  19 18  CREATININE 0.77  --  0.71 0.61  CALCIUM 8.8* 8.8* 8.6* 8.5*   Liver Function Tests:  Recent Labs Lab 08/25/14 1537  ALBUMIN 3.5   No results for input(s): LIPASE, AMYLASE in the last 168 hours. No results for input(s): AMMONIA in the last 168 hours. CBC:  Recent Labs Lab 08/25/14 0937 08/26/14 0225 08/27/14 0214  WBC 13.8* 13.4* 10.6*  NEUTROABS 8.2*  --   --   HGB 12.5 10.3* 9.4*  HCT 36.4 30.7* 28.2*  MCV 95.3 93.9 95.6  PLT 239 247 204   Cardiac Enzymes: No results for input(s): CKTOTAL, CKMB, CKMBINDEX, TROPONINI in the last 168 hours. BNP (last 3 results) No results for input(s): BNP in the last 8760 hours.  ProBNP (last 3 results) No results for input(s): PROBNP in the last 8760 hours.  CBG:  Recent Labs Lab 08/26/14 1926 08/26/14 2324 08/27/14 0514 08/27/14 0807 08/27/14 1132  GLUCAP 181* 131* 133* 107* 122*    Recent Results (from the past 240 hour(s))  Urine culture     Status: None   Collection Time: 08/25/14 12:40 PM  Result Value Ref Range Status   Specimen Description URINE, CLEAN CATCH  Final   Special Requests NONE  Final   Culture   Final    MULTIPLE SPECIES PRESENT, SUGGEST RECOLLECTION IF CLINICALLY INDICATED   Report Status 08/26/2014 FINAL  Final  Surgical pcr screen     Status: None   Collection Time: 08/27/14  4:48 AM  Result Value Ref Range Status   MRSA, PCR NEGATIVE NEGATIVE Final   Staphylococcus aureus NEGATIVE NEGATIVE Final    Comment:        The Xpert SA Assay (FDA approved for NASAL specimens in patients over 53 years of age), is one component of a comprehensive surveillance program.  Test performance has been validated by [facility] for patients greater than or equal to 66 year old. It is not intended to diagnose infection nor to guide or monitor treatment.      Studies: US Soft Tissue Head/neck  08/25/2014   CLINICAL DATA:  Assess thyroid masses as noted on CT. Initial encounter.  EXAM:  THYROID ULTRASOUND  TECHNIQUE: Ultrasound examination of the thyroid gland and adjacent soft tissues was performed.  COMPARISON:  CTA of the chest performed earlier today at 11:43 a.m.  FINDINGS: Right thyroid lobe  Measurements: 6.1 x 3.2 x 3.0 cm.  Multiple nodules are seen.  Left thyroid lobe  Measurements: 7.3 x 3.7 x 2.8 cm. Multiple nodules are seen, with diffuse heterogeneity.  Isthmus  Thickness: 0.4 cm.  Grossly unremarkable in appearance.  Lymphadenopathy  None visualized.  The inferior aspect of the thyroid gland, including the largest masses noted on CTA, is not visualized on this study.  IMPRESSION: Bilateral thyroid nodules, larger on the left. The largest thyroid nodules, measuring up to 4.6 cm at the inferior aspect of the thyroid gland, are not well assessed on this study. Would correlate with thyroid function tests and consider nuclear medicine thyroid uptake and scan, as deemed clinically appropriate.   Electronically Signed   By: Garald Balding M.D.   On: 08/25/2014 20:27   Pelvis Portable  08/27/2014  CLINICAL DATA:  Postop left hip.  EXAM: PORTABLE PELVIS 1-2 VIEWS  COMPARISON:  None.  FINDINGS: Patient status post open reduction internal fixation left intertrochanteric hip fracture. Good anatomic alignment. Hardware intact. No other acute bony abnormality noted. A pessary is noted.  IMPRESSION: Open reduction internal fixation left intertrochanteric hip fracture with good anatomic alignment.   Electronically Signed   By: Marcello Moores  Register   On: 08/27/2014 13:26   Dg C-arm 1-60 Min  08/27/2014   CLINICAL DATA:  Intertrochanteric left hip fracture.  EXAM: DG C-ARM 61-120 MIN; LEFT FEMUR 2 VIEWS  COMPARISON:  08/25/2014  FINDINGS: Intramedullary rod and screw fixation of intertrochanteric left hip fracture is seen which is in near anatomic alignment. No evidence of dislocation.  IMPRESSION: Status post internal fixation of intertrochanteric left hip fracture in near anatomic alignment.    Electronically Signed   By: Earle Gell M.D.   On: 08/27/2014 11:37   Dg Femur Min 2 Views Left  08/27/2014   CLINICAL DATA:  Intertrochanteric left hip fracture.  EXAM: DG C-ARM 61-120 MIN; LEFT FEMUR 2 VIEWS  COMPARISON:  08/25/2014  FINDINGS: Intramedullary rod and screw fixation of intertrochanteric left hip fracture is seen which is in near anatomic alignment. No evidence of dislocation.  IMPRESSION: Status post internal fixation of intertrochanteric left hip fracture in near anatomic alignment.   Electronically Signed   By: Earle Gell M.D.   On: 08/27/2014 11:37    Scheduled Meds: . [START ON 08/28/2014] aspirin EC  325 mg Oral Q breakfast  .  ceFAZolin (ANCEF) IV  2 g Intravenous Q6H  . docusate sodium  100 mg Oral BID  . donepezil  10 mg Oral QHS  . enoxaparin (LOVENOX) injection  40 mg Subcutaneous Q24H  . ezetimibe  10 mg Oral Daily  . insulin aspart  0-9 Units Subcutaneous 6 times per day  . memantine  28 mg Oral Daily  . PARoxetine  20 mg Oral Daily   Continuous Infusions: . diltiazem (CARDIZEM) infusion 5 mg/hr (08/27/14 1500)  . lactated ringers 50 mL/hr (08/27/14 0916)    Principal Problem:   Displaced intertrochanteric fracture of left femur Active Problems:   Dementia   Diabetes mellitus type II, uncontrolled   Fall   Atrial fibrillation with RVR (new onset)   HTN (hypertension)   Elevated d-dimer   Left displaced femoral neck fracture   Goiter    Time spent: 20 min    Kelvin Cellar  [facility] Pager [phone]. If 7PM-7AM, please contact night-coverage at [email or web address], password [facility] 08/27/2014, 4:07 PM  LOS: 2 days

## 2014-08-27 NOTE — Transfer of Care (Signed)
Immediate Anesthesia Transfer of Care Note  Patient: Brittney Powell  Procedure(s) Performed: Procedure(s): INTRAMEDULLARY (IM) NAIL INTERTROCHANTRIC (Left)  Patient Location: PACU  Anesthesia Type:General  Level of Consciousness: awake, alert  and patient cooperative  Airway & Oxygen Therapy: Patient Spontanous Breathing and Patient connected to nasal cannula oxygen  Post-op Assessment: Report given to RN and Post -op Vital signs reviewed and stable  Post vital signs: Reviewed and stable  Last Vitals:  Filed Vitals:   08/27/14 0809  BP: 110/71  Pulse: 82  Temp: 36.6 C  Resp: 17    Complications: No apparent anesthesia complications

## 2014-08-27 NOTE — Op Note (Signed)
DATE OF SURGERY:  08/27/2014  TIME: 4:47 PM  PATIENT NAME:  Brittney Powell  AGE: 81 y.o.  PRE-OPERATIVE DIAGNOSIS:  LEFT HIP FRACTURE  POST-OPERATIVE DIAGNOSIS:  SAME  PROCEDURE:  INTRAMEDULLARY (IM) NAIL INTERTROCHANTRIC  SURGEON:  Rayvon Dakin D  ASSISTANT:  Lovett Calender, PA-C, She was present and scrubbed throughout the case, critical for completion in a timely fashion, and for retraction, instrumentation, and closure.   OPERATIVE IMPLANTS: [employer] Gamma Nail with distal interlock screw  PREOPERATIVE INDICATIONS:  [name] is a 81 y.o. year old who fell and suffered a hip fracture. She was brought into the ER and then admitted and optimized and then elected for surgical intervention.    The risks benefits and alternatives were discussed with the patient including but not limited to the risks of nonoperative treatment, versus surgical intervention including infection, bleeding, nerve injury, malunion, nonunion, hardware prominence, hardware failure, need for hardware removal, blood clots, cardiopulmonary complications, morbidity, mortality, among others, and they were willing to proceed.    OPERATIVE PROCEDURE:  The patient was brought to the operating room and placed in the supine position. General anesthesia was administered, with a foley. She was placed on the fracture table.  Closed reduction was performed under C-arm guidance. The length of the femur was also measured using fluoroscopy. Time out was then performed after sterile prep and drape. She received preoperative antibiotics.  Incision was made proximal to the greater trochanter. A guidewire was placed in the appropriate position. Confirmation was made on AP and lateral views. The above-named nail was opened. I opened the proximal femur with a reamer. I then placed the nail by hand easily down. I did not need to ream the femur.  Once the nail was completely seated, I placed a guidepin into the femoral head into  the center center position. I measured the length, and then reamed the lateral cortex and up into the head. I then placed the lag screw. Slight compression was applied. Anatomic fixation achieved. Bone quality was mediocre.  I then secured the proximal interlocking bolt, and took off a half a turn, and then removed the instruments, and took final C-arm pictures AP and lateral the entire length of the leg.  I then used perfect circles technique to place a distal interlock screw.   Anatomic reconstruction was achieved, and the wounds were irrigated copiously and closed with Vicryl followed by staples and sterile gauze for the skin. The patient was awakened and returned to PACU in stable and satisfactory condition. There no complications and the patient tolerated the procedure well.  She will be weightbearing as tolerated, and will be on chemical px  for a period of four weeks after discharge.   Edmonia Lynch, M.D.    This note was generated using a template and dragon dictation system. In light of that, I have reviewed the note and all aspects of it are applicable to this case. Any dictation errors are due to the computerized dictation system.

## 2014-08-27 NOTE — Interval H&P Note (Signed)
History and Physical Interval Note:  08/27/2014 8:16 AM  Brittney Powell  has presented today for surgery, with the diagnosis of LEFT HIP FRACTURE  The various methods of treatment have been discussed with the patient and family. After consideration of risks, benefits and other options for treatment, the patient has consented to  Procedure(s): INTRAMEDULLARY (IM) NAIL INTERTROCHANTRIC (Left) as a surgical intervention .  The patient's history has been reviewed, patient examined, no change in status, stable for surgery.  I have reviewed the patient's chart and labs.  Questions were answered to the patient's satisfaction.     Sherylann Vangorden D

## 2014-08-27 NOTE — Anesthesia Postprocedure Evaluation (Signed)
  Anesthesia Post-op Note  Patient: Brittney Powell  Procedure(s) Performed: Procedure(s) (LRB): INTRAMEDULLARY (IM) NAIL INTERTROCHANTRIC (Left)  Patient Location: PACU  Anesthesia Type: General  Level of Consciousness: awake and alert   Airway and Oxygen Therapy: Patient Spontanous Breathing  Post-op Pain: mild  Post-op Assessment: Post-op Vital signs reviewed, Patient's Cardiovascular Status Stable, Respiratory Function Stable, Patent Airway and No signs of Nausea or vomiting  Last Vitals:  Filed Vitals:   08/27/14 1145  BP:   Pulse: 89  Temp: 36.1 C  Resp: 15    Post-op Vital Signs: stable   Complications: No apparent anesthesia complications

## 2014-08-27 NOTE — Progress Notes (Signed)
Patient: [name] / Admit Date: 08/25/2014 / Date of Encounter: 08/27/2014, 12:35 PM   Subjective: Postop from hip surgery; No CP or SOB.   Objective Telemetry: atrial fib, rates controlled.  Physical Exam: Blood pressure 106/89, pulse 91, temperature 97.6 F (36.4 C), temperature source Oral, resp. rate 15, height 5\' 6"  (1.676 m), weight 124 lb 5.4 oz (56.4 kg), SpO2 96 %. General: Frail elderly WF in no acute distress. Head: Normal Neck: supple Lungs: CTA anteriorly Heart: Irregular Abdomen: Soft, non-tender, non-distended  Extremities: No edema. s/p left hip surgery Neuro: Grossly intact   Intake/Output Summary (Last 24 hours) at 08/27/14 1235 Last data filed at 08/27/14 1100  Gross per 24 hour  Intake   1225 ml  Output    200 ml  Net   1025 ml    Inpatient Medications:  . [START ON 08/28/2014] aspirin EC  325 mg Oral Q breakfast  .  ceFAZolin (ANCEF) IV  2 g Intravenous Q6H  . docusate sodium  100 mg Oral BID  . donepezil  10 mg Oral QHS  . enoxaparin (LOVENOX) injection  40 mg Subcutaneous Q24H  . ezetimibe  10 mg Oral Daily  . insulin aspart  0-9 Units Subcutaneous 6 times per day  . memantine  28 mg Oral Daily  . PARoxetine  20 mg Oral Daily   Infusions:  . diltiazem (CARDIZEM) infusion 5 mg/hr (08/27/14 0935)  . lactated ringers 50 mL/hr (08/27/14 0916)    Labs:  Recent Labs  08/26/14 0225 08/27/14 0214  NA 137 138  K 4.3 3.8  CL 105 106  CO2 24 25  GLUCOSE 134* 116*  BUN 19 18  CREATININE 0.71 0.61  CALCIUM 8.6* 8.5*    Recent Labs  08/25/14 1537  ALBUMIN 3.5    Recent Labs  08/25/14 0937 08/26/14 0225 08/27/14 0214  WBC 13.8* 13.4* 10.6*  NEUTROABS 8.2*  --   --   HGB 12.5 10.3* 9.4*  HCT 36.4 30.7* 28.2*  MCV 95.3 93.9 95.6  PLT 239 247 204    Recent Labs  08/25/14 1054  HGBA1C 6.6*     Radiology/Studies:  Ct Angio Chest Pe W/cm &/or Wo Cm  08/25/2014   CLINICAL DATA:  Elevated D-dimer.  Patient fell yesterday.   EXAM: CT ANGIOGRAPHY CHEST WITH CONTRAST  TECHNIQUE: Multidetector CT imaging of the chest was performed using the standard protocol during bolus administration of intravenous contrast. Multiplanar CT image reconstructions and MIPs were obtained to evaluate the vascular anatomy.  CONTRAST:  38mL OMNIPAQUE IOHEXOL 350 MG/ML SOLN  COMPARISON:  None.  FINDINGS: There are fractures of the anterior lateral aspects of the left fourth, fifth and sixth ribs bilaterally, age indeterminate.  The patient has a huge goiter that extends substernal to the level of the carina measuring 10 x 6.4 x 6.4 cm. The trachea is slightly anteriorly deviated but not compressed. The esophagus is deviated and compressed.  There are no pulmonary emboli, infiltrates, or effusions. The patient has a large hiatal hernia containing the entire fundus of the stomach. There is secondary compressive atelectasis in the left lower lobe. There is chronic accentuation of the thoracic kyphosis. Extensive calcification thoracic aorta with moderate coronary artery calcification. Far all heart size is normal. The visualized portion of the upper abdomen is normal except for aortic atherosclerosis.  Review of the MIP images confirms the above findings.  IMPRESSION: 1. No pulmonary emboli or other acute abnormalities of the chest. 2. Huge substernal goiter. 3.  Huge hiatal hernia which create secondary compressive atelectasis in the left lower lobe. 4. Subtle fractures of the anterior aspects of the left fourth, fifth and sixth ribs bilaterally, age indeterminate.   Electronically Signed   By: Lorriane Shire M.D.   On: 08/25/2014 12:06   US Soft Tissue Head/neck  08/25/2014   CLINICAL DATA:  Assess thyroid masses as noted on CT. Initial encounter.  EXAM: THYROID ULTRASOUND  TECHNIQUE: Ultrasound examination of the thyroid gland and adjacent soft tissues was performed.  COMPARISON:  CTA of the chest performed earlier today at 11:43 a.m.  FINDINGS: Right thyroid  lobe  Measurements: 6.1 x 3.2 x 3.0 cm.  Multiple nodules are seen.  Left thyroid lobe  Measurements: 7.3 x 3.7 x 2.8 cm. Multiple nodules are seen, with diffuse heterogeneity.  Isthmus  Thickness: 0.4 cm.  Grossly unremarkable in appearance.  Lymphadenopathy  None visualized.  The inferior aspect of the thyroid gland, including the largest masses noted on CTA, is not visualized on this study.  IMPRESSION: Bilateral thyroid nodules, larger on the left. The largest thyroid nodules, measuring up to 4.6 cm at the inferior aspect of the thyroid gland, are not well assessed on this study. Would correlate with thyroid function tests and consider nuclear medicine thyroid uptake and scan, as deemed clinically appropriate.   Electronically Signed   By: Garald Balding M.D.   On: 08/25/2014 20:27   Chest Portable 1 View  08/25/2014   CLINICAL DATA:  Preoperative examination prior to surgery on the left hip and femur, history of goiter, breast malignancy, and diabetes.  EXAM: PORTABLE CHEST - 1 VIEW  COMPARISON:  CT scan of the chest of August 25, 2014 chest x-ray of April 13, 2004.  FINDINGS: The right lung is mildly hyperinflated and clear. On the left there is subtle increased density that is at least in part due to a known large hiatal hernia-partially intra thoracic stomach. There is no significant pleural effusion. The heart is top-normal in size. The pulmonary vascularity is normal. Soft tissue density in the paratracheal regions bilaterally is consistent with the known large goiter. The observed bony thorax exhibits no acute abnormality. The known left lateral rib fractures are not evident on today's study.  IMPRESSION: There is no acute cardiopulmonary abnormality. There is a known large hiatal hernia -partially intra thoracic stomach and a large goiter.   Electronically Signed   By: David  Martinique M.D.   On: 08/25/2014 14:41   Dg C-arm 1-60 Min  08/27/2014   CLINICAL DATA:  Intertrochanteric left hip fracture.   EXAM: DG C-ARM 61-120 MIN; LEFT FEMUR 2 VIEWS  COMPARISON:  08/25/2014  FINDINGS: Intramedullary rod and screw fixation of intertrochanteric left hip fracture is seen which is in near anatomic alignment. No evidence of dislocation.  IMPRESSION: Status post internal fixation of intertrochanteric left hip fracture in near anatomic alignment.   Electronically Signed   By: Earle Gell M.D.   On: 08/27/2014 11:37   Dg Hip Unilat With Pelvis 2-3 Views Left  08/25/2014   CLINICAL DATA:  Hip pain, confusion  EXAM: LEFT HIP (WITH PELVIS) 2-3 VIEWS  COMPARISON:  None  FINDINGS: Osseous demineralization.  Displaced intertrochanteric fracture LEFT femur.  Narrowing of both hip joints without dislocation.  SI joints symmetric.  Atherosclerotic calcification aorta.  Pessary projects over pelvis.  IMPRESSION: Osseous demineralization with displaced intertrochanteric fracture LEFT femur.   Electronically Signed   By: Lavonia Dana M.D.   On: 08/25/2014 09:58  Dg Femur Min 2 Views Left  08/27/2014   CLINICAL DATA:  Intertrochanteric left hip fracture.  EXAM: DG C-ARM 61-120 MIN; LEFT FEMUR 2 VIEWS  COMPARISON:  08/25/2014  FINDINGS: Intramedullary rod and screw fixation of intertrochanteric left hip fracture is seen which is in near anatomic alignment. No evidence of dislocation.  IMPRESSION: Status post internal fixation of intertrochanteric left hip fracture in near anatomic alignment.   Electronically Signed   By: Earle Gell M.D.   On: 08/27/2014 11:37     Assessment and Plan  1. Recurrent fall with resultant left hip fracture - now s/p surgical repair; management per orthopedics.  2. Newly recognized atrial fibrillation with RVR (unknown chronicity) - continue cardizem for rate control; transition or oral form in AM; begin apixaban 2.5 BID when ok with orthopedics.  3. Essential hypertension - Continue present meds   4. Incidental findings of huge thyroid goiter creating esophageal compression, and huge hiatal  hernia leading to atelectasis of LLL (?source of hypoxia) - TSH normal. Further per IM.  Signed, Kirk Ruths

## 2014-08-27 NOTE — Anesthesia Preprocedure Evaluation (Addendum)
Anesthesia Evaluation  Patient identified by MRN, date of birth, ID band Patient awake    Reviewed: Allergy & Precautions, NPO status , Patient's Chart, lab work & pertinent test results  Airway Mallampati: II  TM Distance: >3 FB Neck ROM: Full    Dental no notable dental hx.    Pulmonary neg pulmonary ROS,  breath sounds clear to auscultation  Pulmonary exam normal       Cardiovascular hypertension, + dysrhythmias Atrial Fibrillation Rhythm:Irregular Rate:Tachycardia  Left ventricle: The cavity size was normal. Systolic function was vigorous. The estimated ejection fraction was in the range of 65% to 70%. Although no diagnostic regional wall motion abnormality was identified, this possibility cannot be completely excluded on the basis of this study.  New Afib  ------------------------------------------------------------------- Aortic valve:  Trileaflet; normal thickness leaflets. Mobility was not restricted. Doppler: Transvalvular velocity was within the normal range. There was no stenosis. There was no regurgitation   Neuro/Psych dementia TIAnegative psych ROS   GI/Hepatic negative GI ROS, Neg liver ROS,   Endo/Other  diabetes  Renal/GU negative Renal ROS  negative genitourinary   Musculoskeletal negative musculoskeletal ROS (+)   Abdominal   Peds negative pediatric ROS (+)  Hematology negative hematology ROS (+) anemia ,   Anesthesia Other Findings   Reproductive/Obstetrics negative OB ROS                            Anesthesia Physical Anesthesia Plan  ASA: IV  Anesthesia Plan: General   Post-op Pain Management:    Induction: Intravenous  Airway Management Planned: Oral ETT  Additional Equipment:   Intra-op Plan:   Post-operative Plan: Extubation in OR  Informed Consent: I have reviewed the patients History and Physical, chart, labs and discussed the procedure  including the risks, benefits and alternatives for the proposed anesthesia with the patient or authorized representative who has indicated his/her understanding and acceptance.   Dental advisory given  Plan Discussed with: CRNA and Surgeon  Anesthesia Plan Comments:         Anesthesia Quick Evaluation

## 2014-08-27 NOTE — Anesthesia Procedure Notes (Signed)
Procedure Name: Intubation Date/Time: 08/27/2014 9:43 AM Performed by: Manuela Schwartz B Pre-anesthesia Checklist: Patient identified, Emergency Drugs available, Suction available, Patient being monitored and Timeout performed Patient Re-evaluated:Patient Re-evaluated prior to inductionOxygen Delivery Method: Circle system utilized Preoxygenation: Pre-oxygenation with 100% oxygen Intubation Type: IV induction and Rapid sequence Laryngoscope Size: Mac and 3 Grade View: Grade I Tube type: Oral Tube size: 7.5 mm Number of attempts: 1 Airway Equipment and Method: Stylet Placement Confirmation: ETT inserted through vocal cords under direct vision,  positive ETCO2 and breath sounds checked- equal and bilateral Secured at: 21 cm Tube secured with: Tape Dental Injury: Teeth and Oropharynx as per pre-operative assessment

## 2014-08-27 NOTE — H&P (View-Only) (Signed)
     Subjective:  S/P L hip fracture after a mechanical fall yesterday. Patient reports pain as mild to moderate.  Resting comfortably in bed.  Afib better controlled overnight.   Objective:   VITALS:   Filed Vitals:   08/26/14 0300 08/26/14 0400 08/26/14 0500 08/26/14 0545  BP: 123/100 111/55 111/85   Pulse: 98 85 88   Temp:    99.2 F (37.3 C)  TempSrc:    Oral  Resp: 17 16 16    Height:      Weight:      SpO2: 98% 98% 98%    Alert and oriented this morning but has some confusion at times due to dementia ABD soft Neurovascular intact Sensation intact distally Intact pulses distally Dorsiflexion/Plantar flexion intact   Lab Results  Component Value Date   WBC 13.4* 08/26/2014   HGB 10.3* 08/26/2014   HCT 30.7* 08/26/2014   MCV 93.9 08/26/2014   PLT 247 08/26/2014   BMET    Component Value Date/Time   NA 137 08/26/2014 0225   K 4.3 08/26/2014 0225   CL 105 08/26/2014 0225   CO2 24 08/26/2014 0225   GLUCOSE 134* 08/26/2014 0225   BUN 19 08/26/2014 0225   CREATININE 0.71 08/26/2014 0225   CALCIUM 8.6* 08/26/2014 0225   GFRNONAA >60 08/26/2014 0225   GFRAA >60 08/26/2014 0225     Assessment/Plan:     Principal Problem:   Displaced intertrochanteric fracture of left femur Active Problems:   Dementia   Diabetes mellitus type II, uncontrolled   Fall   Atrial fibrillation with RVR (new onset)   HTN (hypertension)   Elevated d-dimer   Left displaced femoral neck fracture   Goiter  Bedrest for now Plan to take to the OR 7/7 for surgical correction of L hip fracture   Nariah Morgano Marie 08/26/2014, 6:54 AM Cell [phone]

## 2014-08-28 ENCOUNTER — Encounter (HOSPITAL_COMMUNITY): Payer: Self-pay | Admitting: Orthopedic Surgery

## 2014-08-28 LAB — BASIC METABOLIC PANEL
Anion gap: 8 (ref 5–15)
BUN: 22 mg/dL — ABNORMAL HIGH (ref 6–20)
CO2: 27 mmol/L (ref 22–32)
Calcium: 8.4 mg/dL — ABNORMAL LOW (ref 8.9–10.3)
Chloride: 105 mmol/L (ref 101–111)
Creatinine, Ser: 0.67 mg/dL (ref 0.44–1.00)
GFR calc Af Amer: >60 mL/min (ref >60)
GFR calc non Af Amer: >60 mL/min (ref >60)
Glucose, Bld: 130 mg/dL — ABNORMAL HIGH (ref 65–99)
POTASSIUM: 3.7 mmol/L (ref 3.5–5.1)
SODIUM: 140 mmol/L (ref 135–145)

## 2014-08-28 LAB — CBC
HCT: 25.2 % — ABNORMAL LOW (ref 36.0–46.0)
Hemoglobin: 8.5 g/dL — ABNORMAL LOW (ref 12.0–15.0)
MCH: 32.1 pg (ref 26.0–34.0)
MCHC: 33.7 g/dL (ref 30.0–36.0)
MCV: 95.1 fL (ref 78.0–100.0)
Platelets: 221 10*3/uL (ref 150–400)
RBC: 2.65 MIL/uL — ABNORMAL LOW (ref 3.87–5.11)
RDW: 12.6 % (ref 11.5–15.5)
WBC: 9.6 10*3/uL (ref 4.0–10.5)

## 2014-08-28 LAB — GLUCOSE, CAPILLARY
Glucose-Capillary: 131 mg/dL — ABNORMAL HIGH (ref 65–99)
Glucose-Capillary: 145 mg/dL — ABNORMAL HIGH (ref 65–99)
Glucose-Capillary: 156 mg/dL — ABNORMAL HIGH (ref 65–99)
Glucose-Capillary: 177 mg/dL — ABNORMAL HIGH (ref 65–99)
Glucose-Capillary: 208 mg/dL — ABNORMAL HIGH (ref 65–99)
Glucose-Capillary: 223 mg/dL — ABNORMAL HIGH (ref 65–99)

## 2014-08-28 MED ORDER — APIXABAN 2.5 MG PO TABS
2.5000 mg | ORAL_TABLET | Freq: Two times a day (BID) | ORAL | Status: DC
  Administered 2014-08-28 – 2014-08-30 (×5): 2.5 mg via ORAL
  Filled 2014-08-28 (×7): qty 1

## 2014-08-28 MED ORDER — DILTIAZEM HCL ER COATED BEADS 120 MG PO CP24
120.0000 mg | ORAL_CAPSULE | Freq: Every day | ORAL | Status: DC
  Administered 2014-08-28 – 2014-08-30 (×3): 120 mg via ORAL
  Filled 2014-08-28 (×3): qty 1

## 2014-08-28 MED ORDER — GLUCERNA SHAKE PO LIQD
237.0000 mL | Freq: Three times a day (TID) | ORAL | Status: DC
  Administered 2014-08-28 – 2014-08-30 (×5): 237 mL via ORAL

## 2014-08-28 NOTE — Clinical Social Work Placement (Signed)
   CLINICAL SOCIAL WORK PLACEMENT  NOTE  Date:  08/28/2014  Patient Details  Name: Brittney Powell MRN: [id number] Date of Birth: [birth year]  Clinical Social Work is seeking post-discharge placement for this patient at the [place] level of care (*CSW will initial, date and re-position this form in  chart as items are completed):  Yes   Patient/family provided with [facility] Work Department's list of facilities offering this level of care within the geographic area requested by the patient (or if unable, by the patient's family).  Yes   Patient/family informed of their freedom to choose among providers that offer the needed level of care, that participate in Medicare, Medicaid or managed care program needed by the patient, have an available bed and are willing to accept the patient.  Yes   Patient/family informed of [facility]'s ownership interest in [facility] and [facility], as well as of the fact that they are under no obligation to receive care at these facilities.  PASRR submitted to EDS on 08/28/14     PASRR number received on 08/28/14     Existing PASRR number confirmed on       FL2 transmitted to all facilities in geographic area requested by pt/family on 08/28/14     FL2 transmitted to all facilities within larger geographic area on       Patient informed that his/her managed care company has contracts with or will negotiate with certain facilities, including the following:            Patient/family informed of bed offers received.  Patient chooses bed at       Physician recommends and patient chooses bed at      Patient to be transferred to   on  .  Patient to be transferred to facility by       Patient family notified on   of transfer.  Name of family member notified:        PHYSICIAN       Additional Comment:    _______________________________________________ Greta Doom, LCSW 08/28/2014, 12:11 PM

## 2014-08-28 NOTE — Evaluation (Signed)
Physical Therapy Evaluation Patient Details Name: Brittney Powell MRN: [id number] DOB: 1927/10/22 Today's Date: 08/28/2014   History of Present Illness  Patient is an 81 yo female whoe presents Recurrent fall with resultant left hip fracture - now s/p surgical repair (IM nailing)  Clinical Impression  Patient demonstrates deficits in functional mobility as indicated below. Will benefit from continued skilled PT to address deficits and maximize function. Will see as indicated and progress as tolerated. Patient will need SNF upon acute discharge. OF NOTE: limited session secondary to elevated HR in afib 150s    Follow Up Recommendations SNF;Supervision/Assistance - 24 hour    Equipment Recommendations  None recommended by PT (TBD next venue)    Recommendations for Other Services       Precautions / Restrictions Precautions Precautions: Fall Restrictions Weight Bearing Restrictions: Yes LLE Weight Bearing: Weight bearing as tolerated      Mobility  Bed Mobility Overal bed mobility: Needs Assistance;+2 for physical assistance Bed Mobility: Supine to Sit;Sit to Supine     Supine to sit: Max assist;+2 for physical assistance Sit to supine: Total assist;+2 for physical assistance   General bed mobility comments: Pt able to assist with scooting hips to EOB and lifting trunk from bed  (Helicopter technique to rotate trunk and to elevate to EOB)  Transfers                 General transfer comment: unable due to increased HR   Ambulation/Gait                Stairs            Wheelchair Mobility    Modified Rankin (Stroke Patients Only)       Balance Overall balance assessment: Needs assistance Sitting-balance support: Feet supported Sitting balance-Leahy Scale: Poor Sitting balance - Comments: Pt sat EOB x 14 mins with mod A initially, progressing to min guard assist  Postural control: Posterior lean                                    Pertinent Vitals/Pain Pain Assessment: No/denies pain    Home Living Family/patient expects to be discharged to:: Skilled nursing facility                      Prior Function Level of Independence: Needs assistance   Gait / Transfers Assistance Needed: Spouse states pt ambulated with RW and supervision.  Required assist with sit to stand  ADL's / Homemaking Assistance Needed: Pt required total A for all ADLs except self feeding and toilet transfers         Hand Dominance   Dominant Hand: Right    Extremity/Trunk Assessment   Upper Extremity Assessment: Defer to OT evaluation           Lower Extremity Assessment: Generalized weakness;LLE deficits/detail      Cervical / Trunk Assessment: Kyphotic  Communication   Communication: No difficulties  Cognition Arousal/Alertness: Awake/alert Behavior During Therapy: WFL for tasks assessed/performed Overall Cognitive Status: History of cognitive impairments - at baseline                      General Comments General comments (skin integrity, edema, etc.): Pt in A-Fib with HR 123-154    Exercises        Assessment/Plan    PT Assessment Patient needs continued PT services  PT  Diagnosis Generalized weakness;Acute pain;Difficulty walking;Abnormality of gait;Altered mental status   PT Problem List Decreased strength;Decreased range of motion;Decreased activity tolerance;Decreased balance;Decreased mobility;Decreased coordination;Decreased cognition;Decreased safety awareness;Pain  PT Treatment Interventions DME instruction;Gait training;Functional mobility training;Therapeutic activities;Therapeutic exercise;Balance training;Patient/family education   PT Goals (Current goals can be found in the Care Plan section) Acute Rehab PT Goals Patient Stated Goal: rehab then home  PT Goal Formulation: With family Time For Goal Achievement: 09/11/14 Potential to Achieve Goals: Fair    Frequency Min  2X/week   Barriers to discharge        Co-evaluation PT/OT/SLP Co-Evaluation/Treatment: Yes Reason for Co-Treatment: Complexity of the patient's impairments (multi-system involvement);For patient/therapist safety PT goals addressed during session: Mobility/safety with mobility;Balance OT goals addressed during session: Strengthening/ROM       End of Session   Activity Tolerance: Treatment limited secondary to medical complications (Comment) (pain and elevated HR) Patient left: in bed;with call bell/phone within reach;with family/visitor present Nurse Communication: Mobility status         Time: 7026-3785 PT Time Calculation (min) (ACUTE ONLY): 26 min   Charges:   PT Evaluation $Initial PT Evaluation Tier I: 1 Procedure     PT G CodesDuncan Dull 2014-09-02, 3:16 PM Alben Deeds, [place] DPT  [phone]

## 2014-08-28 NOTE — Care Management (Signed)
Important Message  Patient Details  Name: Glorious Flicker MRN: [id number] Date of Birth: [birth year]   Medicare Important Message Given:  Yes-second notification given    Nathen May 08/28/2014, 10:27 AM

## 2014-08-28 NOTE — Evaluation (Signed)
Occupational Therapy Evaluation Patient Details Name: Brittney Powell MRN: [id number] DOB: 1927/04/23 Today's Date: 08/28/2014    History of Present Illness Patient is an 81 yo female whoe presents Recurrent fall with resultant left hip fracture - now s/p surgical repair (IM nailing)   Clinical Impression    Pt admitted with above.  Pt limited to EOB sittting due to elevated HR into 150s (A-Fib).  She demonstrates the below listed deficits.  Currently, she requires total - max A for ADLs.  Recommend SNF level rehab at discharge.  All further OT needs can be addressed at SNF.   Follow Up Recommendations  SNF    Equipment Recommendations  None recommended by OT    Recommendations for Other Services       Precautions / Restrictions Precautions Precautions: Fall Restrictions Weight Bearing Restrictions: Yes LLE Weight Bearing: Weight bearing as tolerated      Mobility Bed Mobility Overal bed mobility: Needs Assistance;+2 for physical assistance Bed Mobility: Supine to Sit;Sit to Supine     Supine to sit: Max assist;+2 for physical assistance Sit to supine: Total assist;+2 for physical assistance   General bed mobility comments: Pt able to assist with scooting hips to EOB and lifting trunk from bed   Transfers                 General transfer comment: unable due to increased HR     Balance Overall balance assessment: Needs assistance Sitting-balance support: Feet supported Sitting balance-Leahy Scale: Poor Sitting balance - Comments: Pt sat EOB x 14 mins with mod A initially, progressing to min guard assist  Postural control: Posterior lean                                  ADL Overall ADL's : Needs assistance/impaired Eating/Feeding: Maximal assistance;Bed level;Sitting   Grooming: Wash/dry hands;Wash/dry face;Moderate assistance;Sitting   Upper Body Bathing: Total assistance;Bed level   Lower Body Bathing: Total assistance;Bed level   Upper  Body Dressing : Total assistance;Bed level   Lower Body Dressing: Total assistance;Bed level   Toilet Transfer: Total assistance Toilet Transfer Details (indicate cue type and reason): unable  Toileting- Clothing Manipulation and Hygiene: Total assistance;Bed level       Functional mobility during ADLs: Total assistance       Vision     Perception     Praxis      Pertinent Vitals/Pain Pain Assessment: No/denies pain     Hand Dominance Right   Extremity/Trunk Assessment     Lower Extremity Assessment Lower Extremity Assessment: Defer to PT evaluation       Communication Communication Communication: No difficulties   Cognition Arousal/Alertness: Awake/alert Behavior During Therapy: WFL for tasks assessed/performed Overall Cognitive Status: History of cognitive impairments - at baseline                     General Comments       Exercises       Shoulder Instructions      Home Living Family/patient expects to be discharged to:: Skilled nursing facility                                        Prior Functioning/Environment Level of Independence: Needs assistance  Gait / Transfers Assistance Needed: Spouse states pt ambulated with RW and  supervision.  Required assist with sit to stand ADL's / Homemaking Assistance Needed: Pt required total A for all ADLs except self feeding and toilet transfers         OT Diagnosis: Generalized weakness;Cognitive deficits   OT Problem List: Decreased strength;Decreased activity tolerance;Impaired balance (sitting and/or standing);Decreased cognition;Decreased safety awareness;Decreased knowledge of precautions;Decreased knowledge of use of DME or AE;Cardiopulmonary status limiting activity   OT Treatment/Interventions: Self-care/ADL training;DME and/or AE instruction;Therapeutic activities;Cognitive remediation/compensation;Patient/family education;Balance training    OT Goals(Current goals can be  found in the care plan section) Acute Rehab OT Goals Patient Stated Goal: rehab then home  OT Goal Formulation: All assessment and education complete, DC therapy  OT Frequency:     Barriers to D/C: Decreased caregiver support          Co-evaluation PT/OT/SLP Co-Evaluation/Treatment: Yes Reason for Co-Treatment: Complexity of the patient's impairments (multi-system involvement);For patient/therapist safety   OT goals addressed during session: Strengthening/ROM      End of Session Nurse Communication: Mobility status  Activity Tolerance: Other (comment) (increased HR ) Patient left: in bed;with call bell/phone within reach;with family/visitor present   Time: 1421-1446 OT Time Calculation (min): 25 min Charges:  OT General Charges $OT Visit: 1 Procedure OT Evaluation $Initial OT Evaluation Tier I: 1 Procedure G-Codes:    Khrystal Jeanmarie, Ellard Artis M 09/17/14, 3:02 PM

## 2014-08-28 NOTE — Progress Notes (Signed)
Patient to transfer to 2W34 report given to receiving nurse Santiago Glad all questions answered at this time.  Pt. VSS with no s/s of distress noted.  Patient stable at transfer.

## 2014-08-28 NOTE — Progress Notes (Addendum)
    Medicine and Cardiology would like Eliquis 2.5mg  BID for DVT prophylaxis and Afib.  We will d/c ASA 325mg  and start Eliquis.   Subjective:  POD#1 IM nail of the L hip. Patient reports pain as mild to moderate.  Resting comfortably in bed.  Her daughter is at the bedside.   Objective:   VITALS:   Filed Vitals:   08/27/14 1500 08/27/14 1957 08/28/14 0003 08/28/14 0454  BP:  110/47 114/56 111/60  Pulse:  123 100 88  Temp: 98.3 F (36.8 C) 97.9 F (36.6 C) 97.8 F (36.6 C) 97.8 F (36.6 C)  TempSrc: Oral Oral Oral Oral  Resp:  16 19 16   Height:      Weight:      SpO2:  91% 96% 96%   Confusion at times due to dementia but alert/oriented this morning ABD soft Neurovascular intact Sensation intact distally Intact pulses distally Dorsiflexion/Plantar flexion intact Incision: scant drainage   Lab Results  Component Value Date   WBC 9.6 08/28/2014   HGB 8.5* 08/28/2014   HCT 25.2* 08/28/2014   MCV 95.1 08/28/2014   PLT 221 08/28/2014   BMET    Component Value Date/Time   NA 140 08/28/2014 0241   K 3.7 08/28/2014 0241   CL 105 08/28/2014 0241   CO2 27 08/28/2014 0241   GLUCOSE 130* 08/28/2014 0241   BUN 22* 08/28/2014 0241   CREATININE 0.67 08/28/2014 0241   CALCIUM 8.4* 08/28/2014 0241   GFRNONAA >60 08/28/2014 0241   GFRAA >60 08/28/2014 0241     Assessment/Plan: 1 Day Post-Op   Principal Problem:   Displaced intertrochanteric fracture of left femur Active Problems:   Dementia   Diabetes mellitus type II, uncontrolled   Fall   Atrial fibrillation with RVR (new onset)   HTN (hypertension)   Elevated d-dimer   Left displaced femoral neck fracture   Goiter   Goiter diffuse   Up with therapy WBAT in the LLE ASA 325mg  daily ok from ortho standpoint for DVT prophylaxis, but given new diagnosis of A fib may need more anticoagulation.  Will defer to medicine team.   Gae Dry 08/28/2014, 6:42 AM Cell [phone]

## 2014-08-28 NOTE — Progress Notes (Addendum)
[facility] PROGRESS NOTE  [name] ERD:[id number] DOB: 03/28/1927 DOA: 08/25/2014 PCP: [name], MD  Assessment/Plan: 1. Left hip fracture.  -Patient with history of dementia, unsteady gait, having a mechanical fall at home well getting out of bed to use restroom. -She was found to be in atrial fibrillation with rapid ventricular response for which cardiology was consulted. -A. fib now controlled, transthoracic echocardiogram showing preserved ejection fraction of 65%.  -On 08/27/2014 she was taken to the OR for intramedullary nail placement of left hip fracture.  -She was started on Eliquis 2.5 mg PO BID for Afib which would be adequate for DVT prophylaxis.  -PT consultation  2.  Atrial fibrillation rapid ventricular response -This represents newly recognized atrial fibrillation, unknown chronicity.  -She was transitioned to Cardizem 120 mg PO q daily, she remains rate controlled  3.  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 [name] of general surgery, who reviewed imaging studies and recommended further workup in the outpatient setting, including FNA. She does not appear to be having issues with swallowing. Satting upper 90's on room air.    4.  Acute blood loss anemia -Labs showing a downward trend in Hg to 8.5 from 9.4 -Will monitor CBC  5.  Hypertension -Patient started on a Cardizem drip as oral anti-hypertensive agents were held -Blood pressures stable  Code Status: Full code Family Communication: I spoke with family members present at bedside Disposition Plan: SNF placement for rehab   Consultants:  Orthopedic surgery  Cardiology   HPI/Subjective: Patient is a pleasant 81 year old female with a past medical history of hypertension, dyslipidemia,  diabetes mellitus, admitted to medicine service on 08/25/2014 after having a fall at home. Patient reporting getting out of bed to use the restroom lost her balance and fell backwards. Imaging studies revealed the presence of a left hip fracture. In the emergency department shows found to be in atrial fibrillation with rapid ventricular response and started on a Cardizem drip. A CT scan of chest with IV contrast was negative for pulmonary emboli or any other acute abdomen abnormalities of the chest. Cardiology was consulted. 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 Endocrine workup showed a TSH within normal limits at 0.457 and a free T4 of 0.84. Case was discussed with Dr. [name] of general surgery who recommended further outpatient workup. He would likely obtain fine-needle biopsy.   Objective: Filed Vitals:   08/28/14 1146  BP: 113/58  Pulse: 110  Temp: 98.2 F (36.8 C)  Resp: 17    Intake/Output Summary (Last 24 hours) at 08/28/14 1310 Last data filed at 08/28/14 0800  Gross per 24 hour  Intake  90.83 ml  Output      0 ml  Net  90.83 ml   Filed Weights   08/25/14 1414 08/27/14 0452  Weight: 55.6 kg (122 lb 9.2 oz) 56.4 kg (124 lb 5.4 oz)    Exam:   General:  Patient does not appear to be in acute distress, she is confused  Cardiovascular: Irregular rate and rhythm, normal S1-S2  Respiratory: Normal respiratory effort, lungs overall clear to auscultation bilaterally  Abdomen: Soft nontender nondistended  Musculoskeletal: Patient having pain with passive and active movement of left lower extremity, no extremity edema, surgical incision site clean  Data Reviewed: Basic Metabolic Panel:  Recent Labs Lab 08/25/14 0937 08/25/14 1537 08/26/14 0225 08/27/14 0214 08/28/14 0241  NA 137  --  137 138 140  K 4.1  --  4.3 3.8 3.7  CL 102  --  105 106 105  CO2 26  --  24 25 27   GLUCOSE 244*  --  134* 116* 130*  BUN 18  --  19 18 22*  CREATININE 0.77  --   0.71 0.61 0.67  CALCIUM 8.8* 8.8* 8.6* 8.5* 8.4*   Liver Function Tests:  Recent Labs Lab 08/25/14 1537  ALBUMIN 3.5   No results for input(s): LIPASE, AMYLASE in the last 168 hours. No results for input(s): AMMONIA in the last 168 hours. CBC:  Recent Labs Lab 08/25/14 0937 08/26/14 0225 08/27/14 0214 08/28/14 0241  WBC 13.8* 13.4* 10.6* 9.6  NEUTROABS 8.2*  --   --   --   HGB 12.5 10.3* 9.4* 8.5*  HCT 36.4 30.7* 28.2* 25.2*  MCV 95.3 93.9 95.6 95.1  PLT 239 247 204 221   Cardiac Enzymes: No results for input(s): CKTOTAL, CKMB, CKMBINDEX, TROPONINI in the last 168 hours. BNP (last 3 results) No results for input(s): BNP in the last 8760 hours.  ProBNP (last 3 results) No results for input(s): PROBNP in the last 8760 hours.  CBG:  Recent Labs Lab 08/27/14 1132 08/27/14 1602 08/27/14 1956 08/28/14 0004 08/28/14 0454  GLUCAP 122* 176* 166* 156* 145*    Recent Results (from the past 240 hour(s))  Urine culture     Status: None   Collection Time: 08/25/14 12:40 PM  Result Value Ref Range Status   Specimen Description URINE, CLEAN CATCH  Final   Special Requests NONE  Final   Culture   Final    MULTIPLE SPECIES PRESENT, SUGGEST RECOLLECTION IF CLINICALLY INDICATED   Report Status 08/26/2014 FINAL  Final  Surgical pcr screen     Status: None   Collection Time: 08/27/14  4:48 AM  Result Value Ref Range Status   MRSA, PCR NEGATIVE NEGATIVE Final   Staphylococcus aureus NEGATIVE NEGATIVE Final    Comment:        The Xpert SA Assay (FDA approved for NASAL specimens in patients over 67 years of age), is one component of a comprehensive surveillance program.  Test performance has been validated by [facility] for patients greater than or equal to 35 year old. It is not intended to diagnose infection nor to guide or monitor treatment.      Studies: Pelvis Portable  08/27/2014   CLINICAL DATA:  Postop left hip.  EXAM: PORTABLE PELVIS 1-2 VIEWS   COMPARISON:  None.  FINDINGS: Patient status post open reduction internal fixation left intertrochanteric hip fracture. Good anatomic alignment. Hardware intact. No other acute bony abnormality noted. A pessary is noted.  IMPRESSION: Open reduction internal fixation left intertrochanteric hip fracture with good anatomic alignment.   Electronically Signed   By: Marcello Moores  Register   On: 08/27/2014 13:26   Dg C-arm 1-60 Min  08/27/2014   CLINICAL DATA:  Intertrochanteric left hip fracture.  EXAM: DG C-ARM 61-120 MIN; LEFT FEMUR 2 VIEWS  COMPARISON:  08/25/2014  FINDINGS: Intramedullary rod and screw fixation of intertrochanteric left hip fracture is seen which is in near anatomic alignment. No evidence of dislocation.  IMPRESSION: Status post internal fixation of intertrochanteric left hip fracture in near anatomic alignment.   Electronically Signed   By: Earle Gell M.D.   On: 08/27/2014 11:37   Dg Femur Min  2 Views Left  08/27/2014   CLINICAL DATA:  Intertrochanteric left hip fracture.  EXAM: DG C-ARM 61-120 MIN; LEFT FEMUR 2 VIEWS  COMPARISON:  08/25/2014  FINDINGS: Intramedullary rod and screw fixation of intertrochanteric left hip fracture is seen which is in near anatomic alignment. No evidence of dislocation.  IMPRESSION: Status post internal fixation of intertrochanteric left hip fracture in near anatomic alignment.   Electronically Signed   By: Earle Gell M.D.   On: 08/27/2014 11:37    Scheduled Meds: . apixaban  2.5 mg Oral BID  . diltiazem  120 mg Oral Daily  . docusate sodium  100 mg Oral BID  . donepezil  10 mg Oral QHS  . ezetimibe  10 mg Oral Daily  . feeding supplement (GLUCERNA SHAKE)  237 mL Oral TID BM  . insulin aspart  0-9 Units Subcutaneous 6 times per day  . memantine  28 mg Oral Daily  . PARoxetine  20 mg Oral Daily   Continuous Infusions: . lactated ringers 50 mL/hr at 08/28/14 9371    Principal Problem:   Displaced intertrochanteric fracture of left femur Active  Problems:   Dementia   Diabetes mellitus type II, uncontrolled   Fall   Atrial fibrillation with RVR (new onset)   HTN (hypertension)   Elevated d-dimer   Left displaced femoral neck fracture   Goiter   Goiter diffuse    Time spent: 20 min    Kelvin Cellar  [facility] Pager [phone]. If 7PM-7AM, please contact night-coverage at [email or web address], password [facility] 08/28/2014, 1:10 PM  LOS: 3 days

## 2014-08-28 NOTE — Progress Notes (Signed)
Utilization review completed.  

## 2014-08-28 NOTE — Progress Notes (Signed)
Initial Nutrition Assessment  DOCUMENTATION CODES:  Not applicable  INTERVENTION:  Glucerna shake, each supplement provides 220 kcal and 10 grams of protein  Encourage adequate PO intake.   NUTRITION DIAGNOSIS:  Increased nutrient needs related to  (s/p surgery) as evidenced by estimated needs.  GOAL:  Patient will meet greater than or equal to 90% of their needs  MONITOR:  PO intake, Supplement acceptance, Weight trends, Labs, I & O's  REASON FOR ASSESSMENT:  Low Braden    ASSESSMENT: Pt presented to the ER after a fall, landing on her left hip and sustaining left hip fracture. While in ED, noted to be in a-fib with RVR and blood work notable for d-dimer >14.  PROCEDURE (7/7): INTRAMEDULLARY (IM) NAIL INTERTROCHANTRIC  Pt reports her appetite is fine. Meal completion has been ~50%. PTA she reports eating well with at least 2 meals a day with snacks in between. Weight has been stable. Pt reports she has tried Boost Plus supplement in the past however did not like the taste. RD to order Glucerna Shake. Pt was encouraged to eat her food at meals.   Nutrition-Focused physical exam completed. Findings are no fat depletion, moderate muscle depletion, and no edema.   Labs: Low calcium. High BUN.  Height:  Ht Readings from Last 1 Encounters:  08/27/14 5\' 6"  (1.676 m)    Weight:  Wt Readings from Last 1 Encounters:  08/27/14 124 lb 5.4 oz (56.4 kg)    Ideal Body Weight:  59 kg  Wt Readings from Last 10 Encounters:  08/27/14 124 lb 5.4 oz (56.4 kg)  03/23/14 121 lb (54.885 kg)  09/16/13 127 lb (57.607 kg)  05/15/13 116 lb (52.617 kg)  04/11/13 120 lb (54.432 kg)    BMI:  Body mass index is 20.08 kg/(m^2).  Estimated Nutritional Needs:  Kcal:  1600-1800  Protein:  70-85 grams  Fluid:  1.6 - 1.8 L/day  Skin:  Wound (see comment) (Incision on L hip, skin tear L buttocks)  Diet Order:  DIET DYS 3 Room service appropriate?: Yes; Fluid consistency::  Thin  EDUCATION NEEDS:  No education needs identified at this time   Intake/Output Summary (Last 24 hours) at 08/28/14 1255 Last data filed at 08/28/14 0800  Gross per 24 hour  Intake  95.83 ml  Output      0 ml  Net  95.83 ml    Last BM:  7/4  Corrin Parker, MS, RD, LDN Pager # [phone] After hours/ weekend pager # [phone]

## 2014-08-28 NOTE — Progress Notes (Signed)
Patient: Brittney Powell / Admit Date: 08/25/2014 / Date of Encounter: 08/28/2014, 11:05 AM   Subjective: No CP or SOB.   Objective Telemetry: atrial fib, rates controlled.  Physical Exam: Blood pressure 107/47, pulse 89, temperature 98.5 F (36.9 C), temperature source Oral, resp. rate 18, height 5\' 6"  (1.676 m), weight 124 lb 5.4 oz (56.4 kg), SpO2 93 %. General: Frail elderly WF in no acute distress. Head: Normal Neck: supple Lungs: CTA anteriorly Heart: Irregular Abdomen: Soft, non-tender, non-distended  Extremities: No edema. s/p left hip surgery Neuro: Grossly intact   Intake/Output Summary (Last 24 hours) at 08/28/14 1105 Last data filed at 08/28/14 0800  Gross per 24 hour  Intake 100.83 ml  Output      0 ml  Net 100.83 ml    Inpatient Medications:  . apixaban  2.5 mg Oral BID  . diltiazem  120 mg Oral Daily  . docusate sodium  100 mg Oral BID  . donepezil  10 mg Oral QHS  . ezetimibe  10 mg Oral Daily  . insulin aspart  0-9 Units Subcutaneous 6 times per day  . memantine  28 mg Oral Daily  . PARoxetine  20 mg Oral Daily   Infusions:  . lactated ringers 50 mL/hr at 08/28/14 0735    Labs:  Recent Labs  08/27/14 0214 08/28/14 0241  NA 138 140  K 3.8 3.7  CL 106 105  CO2 25 27  GLUCOSE 116* 130*  BUN 18 22*  CREATININE 0.61 0.67  CALCIUM 8.5* 8.4*    Recent Labs  08/25/14 1537  ALBUMIN 3.5    Recent Labs  08/27/14 0214 08/28/14 0241  WBC 10.6* 9.6  HGB 9.4* 8.5*  HCT 28.2* 25.2*  MCV 95.6 95.1  PLT 204 221     Radiology/Studies:  Ct Angio Chest Pe W/cm &/or Wo Cm  08/25/2014   CLINICAL DATA:  Elevated D-dimer.  Patient fell yesterday.  EXAM: CT ANGIOGRAPHY CHEST WITH CONTRAST  TECHNIQUE: Multidetector CT imaging of the chest was performed using the standard protocol during bolus administration of intravenous contrast. Multiplanar CT image reconstructions and MIPs were obtained to evaluate the vascular anatomy.  CONTRAST:  65mL OMNIPAQUE  IOHEXOL 350 MG/ML SOLN  COMPARISON:  None.  FINDINGS: There are fractures of the anterior lateral aspects of the left fourth, fifth and sixth ribs bilaterally, age indeterminate.  The patient has a huge goiter that extends substernal to the level of the carina measuring 10 x 6.4 x 6.4 cm. The trachea is slightly anteriorly deviated but not compressed. The esophagus is deviated and compressed.  There are no pulmonary emboli, infiltrates, or effusions. The patient has a large hiatal hernia containing the entire fundus of the stomach. There is secondary compressive atelectasis in the left lower lobe. There is chronic accentuation of the thoracic kyphosis. Extensive calcification thoracic aorta with moderate coronary artery calcification. Far all heart size is normal. The visualized portion of the upper abdomen is normal except for aortic atherosclerosis.  Review of the MIP images confirms the above findings.  IMPRESSION: 1. No pulmonary emboli or other acute abnormalities of the chest. 2. Huge substernal goiter. 3. Huge hiatal hernia which create secondary compressive atelectasis in the left lower lobe. 4. Subtle fractures of the anterior aspects of the left fourth, fifth and sixth ribs bilaterally, age indeterminate.   Electronically Signed   By: Lorriane Shire M.D.   On: 08/25/2014 12:06   US Soft Tissue Head/neck  08/25/2014   CLINICAL DATA:  Assess thyroid masses as noted on CT. Initial encounter.  EXAM: THYROID ULTRASOUND  TECHNIQUE: Ultrasound examination of the thyroid gland and adjacent soft tissues was performed.  COMPARISON:  CTA of the chest performed earlier today at 11:43 a.m.  FINDINGS: Right thyroid lobe  Measurements: 6.1 x 3.2 x 3.0 cm.  Multiple nodules are seen.  Left thyroid lobe  Measurements: 7.3 x 3.7 x 2.8 cm. Multiple nodules are seen, with diffuse heterogeneity.  Isthmus  Thickness: 0.4 cm.  Grossly unremarkable in appearance.  Lymphadenopathy  None visualized.  The inferior aspect of the  thyroid gland, including the largest masses noted on CTA, is not visualized on this study.  IMPRESSION: Bilateral thyroid nodules, larger on the left. The largest thyroid nodules, measuring up to 4.6 cm at the inferior aspect of the thyroid gland, are not well assessed on this study. Would correlate with thyroid function tests and consider nuclear medicine thyroid uptake and scan, as deemed clinically appropriate.   Electronically Signed   By: Garald Balding M.D.   On: 08/25/2014 20:27   Pelvis Portable  08/27/2014   CLINICAL DATA:  Postop left hip.  EXAM: PORTABLE PELVIS 1-2 VIEWS  COMPARISON:  None.  FINDINGS: Patient status post open reduction internal fixation left intertrochanteric hip fracture. Good anatomic alignment. Hardware intact. No other acute bony abnormality noted. A pessary is noted.  IMPRESSION: Open reduction internal fixation left intertrochanteric hip fracture with good anatomic alignment.   Electronically Signed   By: Marcello Moores  Register   On: 08/27/2014 13:26   Chest Portable 1 View  08/25/2014   CLINICAL DATA:  Preoperative examination prior to surgery on the left hip and femur, history of goiter, breast malignancy, and diabetes.  EXAM: PORTABLE CHEST - 1 VIEW  COMPARISON:  CT scan of the chest of August 25, 2014 chest x-ray of April 13, 2004.  FINDINGS: The right lung is mildly hyperinflated and clear. On the left there is subtle increased density that is at least in part due to a known large hiatal hernia-partially intra thoracic stomach. There is no significant pleural effusion. The heart is top-normal in size. The pulmonary vascularity is normal. Soft tissue density in the paratracheal regions bilaterally is consistent with the known large goiter. The observed bony thorax exhibits no acute abnormality. The known left lateral rib fractures are not evident on today's study.  IMPRESSION: There is no acute cardiopulmonary abnormality. There is a known large hiatal hernia -partially intra  thoracic stomach and a large goiter.   Electronically Signed   By: David  Martinique M.D.   On: 08/25/2014 14:41   Dg C-arm 1-60 Min  08/27/2014   CLINICAL DATA:  Intertrochanteric left hip fracture.  EXAM: DG C-ARM 61-120 MIN; LEFT FEMUR 2 VIEWS  COMPARISON:  08/25/2014  FINDINGS: Intramedullary rod and screw fixation of intertrochanteric left hip fracture is seen which is in near anatomic alignment. No evidence of dislocation.  IMPRESSION: Status post internal fixation of intertrochanteric left hip fracture in near anatomic alignment.   Electronically Signed   By: Earle Gell M.D.   On: 08/27/2014 11:37   Dg Hip Unilat With Pelvis 2-3 Views Left  08/25/2014   CLINICAL DATA:  Hip pain, confusion  EXAM: LEFT HIP (WITH PELVIS) 2-3 VIEWS  COMPARISON:  None  FINDINGS: Osseous demineralization.  Displaced intertrochanteric fracture LEFT femur.  Narrowing of both hip joints without dislocation.  SI joints symmetric.  Atherosclerotic calcification aorta.  Pessary projects over pelvis.  IMPRESSION: Osseous demineralization with displaced  intertrochanteric fracture LEFT femur.   Electronically Signed   By: Lavonia Dana M.D.   On: 08/25/2014 09:58   Dg Femur Min 2 Views Left  08/27/2014   CLINICAL DATA:  Intertrochanteric left hip fracture.  EXAM: DG C-ARM 61-120 MIN; LEFT FEMUR 2 VIEWS  COMPARISON:  08/25/2014  FINDINGS: Intramedullary rod and screw fixation of intertrochanteric left hip fracture is seen which is in near anatomic alignment. No evidence of dislocation.  IMPRESSION: Status post internal fixation of intertrochanteric left hip fracture in near anatomic alignment.   Electronically Signed   By: Earle Gell M.D.   On: 08/27/2014 11:37     Assessment and Plan  1. Recurrent fall with resultant left hip fracture - now s/p surgical repair; management per orthopedics.  2. Newly recognized atrial fibrillation with RVR (unknown chronicity) - change cardizem to oral form; increase as needed for rate control; begin  apixaban 2.5 mg BID; FU with Dr Acie Fredrickson following DC.  3. Essential hypertension - Continue present meds   4. Incidental findings of huge thyroid goiter creating esophageal compression, and huge hiatal hernia leading to atelectasis of LLL (?source of hypoxia) - TSH normal. Further per IM.  Signed, Kirk Ruths

## 2014-08-28 NOTE — Care Management Note (Signed)
Case Management Note  Patient Details  Name: Teneshia Hedeen MRN: [id number] Date of Birth: 22-May-1927  Subjective/Objective:     Pt admitted s/p fall at home- with hip fx- s/p repair on 08/27/14               Action/Plan: PTA pt lived at home- PT/OT evals- recommending SNF- CSW following for placement needs- family would like [facility]  Expected Discharge Date:                  Expected Discharge Plan:  Skilled Nursing Facility  In-House Referral:  Clinical Social Work  Discharge planning Services  CM Consult  Post Acute Care Choice:    Choice offered to:     DME Arranged:    DME Agency:     HH Arranged:    [place] Agency:     Status of Service:  In process, will continue to follow  Medicare Important Message Given:  Yes-second notification given Date Medicare IM Given:    Medicare IM give by:    Date Additional Medicare IM Given:    Additional Medicare Important Message give by:     If discussed at [facility] of Stay Meetings, dates discussed:    Additional Comments:  Dawayne Patricia, RN 08/28/2014, 3:49 PM

## 2014-08-28 NOTE — Clinical Social Work Note (Signed)
CSW met with patient and her husband to discuss bed offers.  Patient and her husband would like her to go to [facility], CSW contacted [facility] and sent them updated clinicals for possible weekend discharge per physician.  [facility] said they can take patient over the weekend and to have CSW contact Crystal at [phone] once patient is medically ready for discharge and orders have been received.  CSW to continue to follow patient throughout discharge planning FL2 on chart awaiting signature from physician.   R. , MSW, LCSWA [phone] 08/28/2014 5:04 PM   

## 2014-08-29 DIAGNOSIS — D62 Acute posthemorrhagic anemia: Secondary | ICD-10-CM

## 2014-08-29 LAB — GLUCOSE, CAPILLARY
Glucose-Capillary: 123 mg/dL — ABNORMAL HIGH (ref 65–99)
Glucose-Capillary: 239 mg/dL — ABNORMAL HIGH (ref 65–99)
Glucose-Capillary: 139 mg/dL — ABNORMAL HIGH (ref 65–99)
Glucose-Capillary: 209 mg/dL — ABNORMAL HIGH (ref 65–99)
Glucose-Capillary: 136 mg/dL — ABNORMAL HIGH (ref 65–99)

## 2014-08-29 LAB — CBC
HEMATOCRIT: 23.6 % — AB (ref 36.0–46.0)
HEMOGLOBIN: 7.9 g/dL — AB (ref 12.0–15.0)
MCH: 32 pg (ref 26.0–34.0)
MCHC: 33.5 g/dL (ref 30.0–36.0)
MCV: 95.5 fL (ref 78.0–100.0)
Platelets: 238 10*3/uL (ref 150–400)
RBC: 2.47 MIL/uL — ABNORMAL LOW (ref 3.87–5.11)
RDW: 12.8 % (ref 11.5–15.5)
WBC: 8.1 10*3/uL (ref 4.0–10.5)

## 2014-08-29 LAB — HEMOGLOBIN AND HEMATOCRIT, BLOOD
HEMATOCRIT: 25.6 % — AB (ref 36.0–46.0)
HEMOGLOBIN: 8.5 g/dL — AB (ref 12.0–15.0)

## 2014-08-29 LAB — PREPARE RBC (CROSSMATCH)

## 2014-08-29 MED ORDER — DILTIAZEM HCL ER COATED BEADS 120 MG PO CP24: 120.0000 mg | ORAL_CAPSULE | Freq: Every day | ORAL | Status: DC

## 2014-08-29 MED ORDER — APIXABAN 2.5 MG PO TABS: 2.5000 mg | ORAL_TABLET | Freq: Two times a day (BID) | ORAL | Status: AC

## 2014-08-29 MED ORDER — SODIUM CHLORIDE 0.9 % IV SOLN: Freq: Once | INTRAVENOUS | Status: DC

## 2014-08-29 NOTE — Progress Notes (Signed)
Orthopedic Tech Progress Note Patient Details:  Brittney Powell 01/11/1928 [id number]  Patient ID: [name], female   DOB: Mar 26, 1927, 81 y.o.   MRN: [id number] Pt unable to use trapeze bar patient helper  Hildred Priest 08/29/2014, 6:58 AM

## 2014-08-29 NOTE — Discharge Summary (Signed)
Physician Discharge Summary  Brittney Powell KWI:[id number] DOB: April 29, 1927 DOA: 08/25/2014  PCP: [name], MD  Admit date: 08/25/2014 Discharge date: 08/30/2014  Time spent: 35 minutes  Recommendations for Outpatient Follow-up:  1. Please follow up on a CBC in 3-4 days, had Hg of 8.5 on day of discharge 2. Follow up on Afib, she was discharged on Cardizem 120 mg PO q daily along with Eliquis 3. Patient to follow up on Dr [name] of General Surgery in 2 weeks for goiter  Discharge Diagnoses:  Principal Problem:   Displaced intertrochanteric fracture of left femur Active Problems:   Dementia   Diabetes mellitus type II, uncontrolled   Fall   Atrial fibrillation with RVR (new onset)   HTN (hypertension)   Elevated d-dimer   Left displaced femoral neck fracture   Goiter   Goiter diffuse   Discharge Condition: Stable  Diet recommendation: Heart Healthy  Filed Weights   08/25/14 1414 08/27/14 0452  Weight: 55.6 kg (122 lb 9.2 oz) 56.4 kg (124 lb 5.4 oz)    History of present illness:  This is an 81 year old female patient who lives at home with her husband who presented to the ER after a fall. Patient has a past medical history of rapidly progressive dementia followed by Dr. Jannifer Powell as an outpatient, diabetes, hypertension and dyslipidemia. Husband reports that as per usual he assisted patient out of bed and up to her rolling walker and then walked away from the patient so she could mobilize to the bathroom. Patient only took about 2 steps when she fell backward and landed on her left hip. According to the husband over the past year the patient has only fallen maybe 2-3 times. Patient did not have loss of consciousness with fall. Patient denied any chest pain, shortness of breath, lightheadedness or dizziness prior to fall. She does not recall tripping over the rug or other items on the floor. She reports she fell she simply lost her balance. He is not reporting pain upon arrival to  the ER.  In the ER patient was afebrile, normotensive but tachycardic with a heart rate in the 120s with underlying rhythm of atrial fibrillation which is new for her. She is maintaining room air saturations of 100%. Laboratory data were unremarkable except for glucose of 244. White count slightly elevated at 13,800 with neutrophils 59%. Hemoglobin 12.5. Creatinine 0.77 and BUN 18. Troponin 0.01. Hip and pelvis x-rays revealed osseous demineralization with displaced intertrochanteric fracture of the left femur. Orthopedic services are to been consulted and plans are to pursue operative intervention on Thursday. Because of the new onset atrial fibrillation EDP has contacted cardiology for formal consultation and patient has been started on a Cardizem infusion currently at 15 mg per hour. Of note a d-dimer was checked and was significantly elevated at 14.76.  Hospital Course:  Patient is a pleasant 81 year old female with a past medical history of hypertension, dyslipidemia, diabetes mellitus, admitted to medicine service on 08/25/2014 after having a fall at home. Patient reporting getting out of bed to use the restroom lost her balance and fell backwards. Imaging studies revealed the presence of a left hip fracture. In the emergency department shows found to be in atrial fibrillation with rapid ventricular response and started on a Cardizem drip. A CT scan of chest with IV contrast was negative for pulmonary emboli or any other acute abdomen abnormalities of the chest. Cardiology was consulted. 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 Endocrine workup showed a  TSH within normal limits at 0.457 and a free T4 of 0.84. Case was discussed with Dr. [name] of general surgery who recommended further outpatient workup. He would likely obtain fine-needle biopsy.    Left hip fracture. -Patient with history of dementia, unsteady gait, having a mechanical fall at home well getting out of bed to use  restroom. -She was found to be in atrial fibrillation with rapid ventricular response for which cardiology was consulted. -A. fib now controlled, transthoracic echocardiogram showing preserved ejection fraction of 65%.  -On 08/27/2014 she was taken to the OR for intramedullary nail placement of left hip fracture.  -She was started on Eliquis 2.5 mg PO BID for Afib which would be adequate for DVT prophylaxis.   2. Atrial fibrillation rapid ventricular response -This represents newly recognized atrial fibrillation, unknown chronicity.  -She was transitioned to Cardizem 120 mg PO q daily, she remains rate controlled -Started on Eliquis 2.5 mg PO BID  3. 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 [name] of general surgery, who reviewed imaging studies and recommended further workup in the outpatient setting, including FNA. She does not appear to be having issues with swallowing. Satting upper 90's on room air.   4.  Postoperative blood loss anemia -Hg trended down 7.9 for which she was typed and crossed and transfused 1 unit of PRBC's -Repeat H&H 8.5  Procedures:  Intramedullary nail placement of left hip fracture performed on 08/27/2014  Consultations:  Cardiology  Orthopedic Surgery  Discharge Exam: Filed Vitals:   08/29/14 0418  BP: 112/61  Pulse: 101  Temp: 98.2 F (36.8 C)  Resp: 18    General: Patient does not appear to be in acute distress, she is confused  Cardiovascular: Irregular rate and rhythm, normal S1-S2  Respiratory: Normal respiratory effort, lungs overall clear to auscultation bilaterally  Abdomen: Soft nontender nondistended  Musculoskeletal:  no extremity edema, surgical incision site clean  Discharge Instructions   Discharge Instructions     Call MD for:  difficulty breathing, headache or visual disturbances    Complete by:  As directed      Call MD for:  extreme fatigue    Complete by:  As directed      Call MD for:  hives    Complete by:  As directed      Call MD for:  persistant dizziness or light-headedness    Complete by:  As directed      Call MD for:  persistant nausea and vomiting    Complete by:  As directed      Call MD for:  redness, tenderness, or signs of infection (pain, swelling, redness, odor or green/yellow discharge around incision site)    Complete by:  As directed      Call MD for:  severe uncontrolled pain    Complete by:  As directed      Call MD for:  temperature >100.4    Complete by:  As directed      Call MD for:    Complete by:  As directed      Diet - low sodium heart healthy    Complete by:  As directed      Increase activity slowly    Complete by:  As directed      Weight bearing as tolerated  Complete by:  As directed   Laterality:  left  Extremity:  Lower          Current Discharge Medication List    START taking these medications   Details  apixaban (ELIQUIS) 2.5 MG TABS tablet Take 1 tablet (2.5 mg total) by mouth 2 (two) times daily. Qty: 60 tablet, Refills: 60    diltiazem (CARDIZEM CD) 120 MG 24 hr capsule Take 1 capsule (120 mg total) by mouth daily. Qty: 30 capsule, Refills: 1    docusate sodium (COLACE) 100 MG capsule Take 1 capsule (100 mg total) by mouth 2 (two) times daily. Qty: 10 capsule, Refills: 0    HYDROcodone-acetaminophen (NORCO) 5-325 MG per tablet Take 1-2 tablets by mouth every 6 (six) hours as needed for moderate pain. Qty: 90 tablet, Refills: 0    ondansetron (ZOFRAN) 4 MG tablet Take 1 tablet (4 mg total) by mouth every 8 (eight) hours as needed for nausea or vomiting. Qty: 20 tablet, Refills: 0      CONTINUE these medications which have NOT CHANGED   Details  Calcium-Magnesium-Zinc 167-83-8 MG TABS Take 1 tablet by mouth daily.     donepezil (ARICEPT) 10 MG tablet Take 1 tablet (10 mg total) by mouth at bedtime. Qty: 90 tablet, Refills: 3    ezetimibe (ZETIA) 10 MG tablet Take 10 mg by mouth daily.    memantine (NAMENDA XR) 28 MG CP24 24 hr capsule Take 1 capsule (28 mg total) by mouth daily. Qty: 90 capsule, Refills: 1    Multiple Vitamins-Minerals (CENTRUM SILVER PO) Take 1 capsule by mouth 3 x daily with food.    oxybutynin (DITROPAN) 5 MG tablet Take 5 mg by mouth 2 (two) times daily.    PARoxetine (PAXIL) 20 MG tablet Take 20 mg by mouth daily.    vitamin B-12 (CYANOCOBALAMIN) 1000 MCG tablet Take 1,000 mcg by mouth daily.      STOP taking these medications     aspirin EC 81 MG tablet      glimepiride (AMARYL) 4 MG tablet      lisinopril (PRINIVIL,ZESTRIL) 5 MG tablet      MYRBETRIQ 25 MG TB24 tablet        Allergies  Allergen Reactions  . Penicillins    Follow-up Information    Follow up with MURPHY, TIMOTHY D, MD In 1 week.   Specialty:  Orthopedic Surgery   Contact information:   [address] [place] [address] [phone]       Follow up with [name], MD In 1 week.   Specialty:  Family Medicine   Contact information:   [address] [place] [address] [phone]       Follow up with Kirk Ruths, MD In 2 weeks.   Specialty:  Cardiology   Contact information:   [address] [place] [address] [phone]        The results of significant diagnostics from this hospitalization (including imaging, microbiology, ancillary and laboratory) are listed below for reference.    Significant Diagnostic Studies: Ct Angio Chest Pe W/cm &/or Wo Cm  08/25/2014   CLINICAL DATA:  Elevated D-dimer.  Patient fell yesterday.  EXAM: CT ANGIOGRAPHY CHEST WITH CONTRAST  TECHNIQUE: Multidetector CT imaging of the chest was performed using the standard protocol during bolus administration of intravenous contrast. Multiplanar CT  image reconstructions and MIPs were obtained to evaluate the vascular anatomy.  CONTRAST:  62mL OMNIPAQUE IOHEXOL 350 MG/ML  SOLN  COMPARISON:  None.  FINDINGS: There are fractures of the anterior lateral aspects of the left fourth, fifth and sixth ribs bilaterally, age indeterminate.  The patient has a huge goiter that extends substernal to the level of the carina measuring 10 x 6.4 x 6.4 cm. The trachea is slightly anteriorly deviated but not compressed. The esophagus is deviated and compressed.  There are no pulmonary emboli, infiltrates, or effusions. The patient has a large hiatal hernia containing the entire fundus of the stomach. There is secondary compressive atelectasis in the left lower lobe. There is chronic accentuation of the thoracic kyphosis. Extensive calcification thoracic aorta with moderate coronary artery calcification. Far all heart size is normal. The visualized portion of the upper abdomen is normal except for aortic atherosclerosis.  Review of the MIP images confirms the above findings.  IMPRESSION: 1. No pulmonary emboli or other acute abnormalities of the chest. 2. Huge substernal goiter. 3. Huge hiatal hernia which create secondary compressive atelectasis in the left lower lobe. 4. Subtle fractures of the anterior aspects of the left fourth, fifth and sixth ribs bilaterally, age indeterminate.   Electronically Signed   By: Lorriane Shire M.D.   On: 08/25/2014 12:06   US Soft Tissue Head/neck  08/25/2014   CLINICAL DATA:  Assess thyroid masses as noted on CT. Initial encounter.  EXAM: THYROID ULTRASOUND  TECHNIQUE: Ultrasound examination of the thyroid gland and adjacent soft tissues was performed.  COMPARISON:  CTA of the chest performed earlier today at 11:43 a.m.  FINDINGS: Right thyroid lobe  Measurements: 6.1 x 3.2 x 3.0 cm.  Multiple nodules are seen.  Left thyroid lobe  Measurements: 7.3 x 3.7 x 2.8 cm. Multiple nodules are seen, with diffuse heterogeneity.  Isthmus  Thickness: 0.4  cm.  Grossly unremarkable in appearance.  Lymphadenopathy  None visualized.  The inferior aspect of the thyroid gland, including the largest masses noted on CTA, is not visualized on this study.  IMPRESSION: Bilateral thyroid nodules, larger on the left. The largest thyroid nodules, measuring up to 4.6 cm at the inferior aspect of the thyroid gland, are not well assessed on this study. Would correlate with thyroid function tests and consider nuclear medicine thyroid uptake and scan, as deemed clinically appropriate.   Electronically Signed   By: Garald Balding M.D.   On: 08/25/2014 20:27   Pelvis Portable  08/27/2014   CLINICAL DATA:  Postop left hip.  EXAM: PORTABLE PELVIS 1-2 VIEWS  COMPARISON:  None.  FINDINGS: Patient status post open reduction internal fixation left intertrochanteric hip fracture. Good anatomic alignment. Hardware intact. No other acute bony abnormality noted. A pessary is noted.  IMPRESSION: Open reduction internal fixation left intertrochanteric hip fracture with good anatomic alignment.   Electronically Signed   By: Marcello Moores  Register   On: 08/27/2014 13:26   Chest Portable 1 View  08/25/2014   CLINICAL DATA:  Preoperative examination prior to surgery on the left hip and femur, history of goiter, breast malignancy, and diabetes.  EXAM: PORTABLE CHEST - 1 VIEW  COMPARISON:  CT scan of the chest of August 25, 2014 chest x-ray of April 13, 2004.  FINDINGS: The right lung is mildly hyperinflated and clear. On the left there is subtle increased density that is at least in part due to a known large hiatal hernia-partially intra thoracic stomach. There is no significant pleural effusion. The heart is top-normal in size. The pulmonary vascularity is normal. Soft tissue density in the paratracheal regions bilaterally is  consistent with the known large goiter. The observed bony thorax exhibits no acute abnormality. The known left lateral rib fractures are not evident on today's study.  IMPRESSION:  There is no acute cardiopulmonary abnormality. There is a known large hiatal hernia -partially intra thoracic stomach and a large goiter.   Electronically Signed   By: David  Martinique M.D.   On: 08/25/2014 14:41   Dg C-arm 1-60 Min  08/27/2014   CLINICAL DATA:  Intertrochanteric left hip fracture.  EXAM: DG C-ARM 61-120 MIN; LEFT FEMUR 2 VIEWS  COMPARISON:  08/25/2014  FINDINGS: Intramedullary rod and screw fixation of intertrochanteric left hip fracture is seen which is in near anatomic alignment. No evidence of dislocation.  IMPRESSION: Status post internal fixation of intertrochanteric left hip fracture in near anatomic alignment.   Electronically Signed   By: Earle Gell M.D.   On: 08/27/2014 11:37   Dg Hip Unilat With Pelvis 2-3 Views Left  08/25/2014   CLINICAL DATA:  Hip pain, confusion  EXAM: LEFT HIP (WITH PELVIS) 2-3 VIEWS  COMPARISON:  None  FINDINGS: Osseous demineralization.  Displaced intertrochanteric fracture LEFT femur.  Narrowing of both hip joints without dislocation.  SI joints symmetric.  Atherosclerotic calcification aorta.  Pessary projects over pelvis.  IMPRESSION: Osseous demineralization with displaced intertrochanteric fracture LEFT femur.   Electronically Signed   By: Lavonia Dana M.D.   On: 08/25/2014 09:58   Dg Femur Min 2 Views Left  08/27/2014   CLINICAL DATA:  Intertrochanteric left hip fracture.  EXAM: DG C-ARM 61-120 MIN; LEFT FEMUR 2 VIEWS  COMPARISON:  08/25/2014  FINDINGS: Intramedullary rod and screw fixation of intertrochanteric left hip fracture is seen which is in near anatomic alignment. No evidence of dislocation.  IMPRESSION: Status post internal fixation of intertrochanteric left hip fracture in near anatomic alignment.   Electronically Signed   By: Earle Gell M.D.   On: 08/27/2014 11:37    Microbiology: Recent Results (from the past 240 hour(s))  Urine culture     Status: None   Collection Time: 08/25/14 12:40 PM  Result Value Ref Range Status   Specimen  Description URINE, CLEAN CATCH  Final   Special Requests NONE  Final   Culture   Final    MULTIPLE SPECIES PRESENT, SUGGEST RECOLLECTION IF CLINICALLY INDICATED   Report Status 08/26/2014 FINAL  Final  Surgical pcr screen     Status: None   Collection Time: 08/27/14  4:48 AM  Result Value Ref Range Status   MRSA, PCR NEGATIVE NEGATIVE Final   Staphylococcus aureus NEGATIVE NEGATIVE Final    Comment:        The Xpert SA Assay (FDA approved for NASAL specimens in patients over 47 years of age), is one component of a comprehensive surveillance program.  Test performance has been validated by [facility] for patients greater than or equal to 35 year old. It is not intended to diagnose infection nor to guide or monitor treatment.      Labs: Basic Metabolic Panel:  Recent Labs Lab 08/25/14 0937 08/25/14 1537 08/26/14 0225 08/27/14 0214 08/28/14 0241  NA 137  --  137 138 140  K 4.1  --  4.3 3.8 3.7  CL 102  --  105 106 105  CO2 26  --  24 25 27   GLUCOSE 244*  --  134* 116* 130*  BUN 18  --  19 18 22*  CREATININE 0.77  --  0.71 0.61 0.67  CALCIUM 8.8* 8.8* 8.6*  8.5* 8.4*   Liver Function Tests:  Recent Labs Lab 08/25/14 1537  ALBUMIN 3.5   No results for input(s): LIPASE, AMYLASE in the last 168 hours. No results for input(s): AMMONIA in the last 168 hours. CBC:  Recent Labs Lab 08/25/14 0937 08/26/14 0225 08/27/14 0214 08/28/14 0241  WBC 13.8* 13.4* 10.6* 9.6  NEUTROABS 8.2*  --   --   --   HGB 12.5 10.3* 9.4* 8.5*  HCT 36.4 30.7* 28.2* 25.2*  MCV 95.3 93.9 95.6 95.1  PLT 239 247 204 221   Cardiac Enzymes: No results for input(s): CKTOTAL, CKMB, CKMBINDEX, TROPONINI in the last 168 hours. BNP: BNP (last 3 results) No results for input(s): BNP in the last 8760 hours.  ProBNP (last 3 results) No results for input(s): PROBNP in the last 8760 hours.  CBG:  Recent Labs Lab 08/28/14 1309 08/28/14 1632 08/28/14 2007 08/28/14 2352 08/29/14 0405   GLUCAP 131* 208* 177* 223* 123*       Signed:  Malakai Schoenherr  [facility] 08/29/2014, 8:18 AM

## 2014-08-29 NOTE — Discharge Instructions (Signed)
INSTRUCTIONS ° °o Remove items at home which could result in a fall. This includes throw rugs or furniture in walking pathways °o ICE to the affected joint every three hours while awake for 30 minutes at a time, for at least the first 3-5 days, and then as needed for pain and swelling.  Continue to use ice for pain and swelling. You may notice swelling that will progress down to the foot and ankle.  This is normal after surgery.  Elevate your leg when you are not up walking on it.   °o Continue to use the breathing machine you got in the hospital (incentive spirometer) which will help keep your temperature down.  It is common for your temperature to cycle up and down following surgery, especially at night when you are not up moving around and exerting yourself.  The breathing machine keeps your lungs expanded and your temperature down. ° ° °DIET:  As you were doing prior to hospitalization, we recommend a well-balanced diet. ° °DRESSING / WOUND CARE / SHOWERING ° °Keep the surgical dressing until follow up.  IF THE DRESSING FALLS OFF or the wound gets wet inside, change the dressing with sterile gauze.  Please use good hand washing techniques before changing the dressing.  Do not use any lotions or creams on the incision until instructed by your surgeon.   ° °ACTIVITY ° °o Increase activity slowly as tolerated, but follow the weight bearing instructions below.   °o No driving for 6 weeks or until further direction given by your physician.  You cannot drive while taking narcotics.  °o No lifting or carrying greater than 10 lbs. until further directed by your surgeon. °o Avoid periods of inactivity such as sitting longer than an hour when not asleep. This helps prevent blood clots.  °o You may return to work once you are authorized by your doctor.  ° ° °WEIGHT BEARING  ° °Weight bearing as tolerated with assist device (walker, cane, etc) as directed, use it as long as suggested by your surgeon or therapist, typically  at least 4-6 weeks. ° ° °CONSTIPATION ° °Constipation is defined medically as fewer than three stools per week and severe constipation as less than one stool per week.  Even if you have a regular bowel pattern at home, your normal regimen is likely to be disrupted due to multiple reasons following surgery.  Combination of anesthesia, postoperative narcotics, change in appetite and fluid intake all can affect your bowels.  ° °YOU MUST use at least one of the following options; they are listed in order of increasing strength to get the job done.  They are all available over the counter, and you may need to use some, POSSIBLY even all of these options:   ° °Drink plenty of fluids (prune juice may be helpful) and high fiber foods °Colace 100 mg by mouth twice a day  °Senokot for constipation as directed and as needed Dulcolax (bisacodyl), take with full glass of water  °Miralax (polyethylene glycol) once or twice a day as needed. ° °If you have tried all these things and are unable to have a bowel movement in the first 3-4 days after surgery call either your surgeon or your primary doctor.   ° °If you experience loose stools or diarrhea, hold the medications until you stool forms back up.  If your symptoms do not get better within 1 week or if they get worse, check with your doctor.  If you experience "the worst   abdominal pain ever" or develop nausea or vomiting, please contact the office immediately for further recommendations for treatment.   ITCHING:  If you experience itching with your medications, try taking only a single pain pill, or even half a pain pill at a time.  You can also use Benadryl over the counter for itching or also to help with sleep.   TED HOSE STOCKINGS:  Use stockings on both legs until for at least 2 weeks or as directed by physician office. They may be removed at night for sleeping.  MEDICATIONS:  See your medication summary on the After Visit Summary that nursing will review with you.   You may have some home medications which will be placed on hold until you complete the course of blood thinner medication.  It is important for you to complete the blood thinner medication as prescribed.  PRECAUTIONS:  If you experience chest pain or shortness of breath - call 911 immediately for transfer to the hospital emergency department.   If you develop a fever greater that 101 F, purulent drainage from wound, increased redness or drainage from wound, foul odor from the wound/dressing, or calf pain - CONTACT YOUR SURGEON.                                                   FOLLOW-UP APPOINTMENTS:  If you do not already have a post-op appointment, please call the office for an appointment to be seen by your surgeon.  Guidelines for how soon to be seen are listed in your After Visit Summary, but are typically between 1-4 weeks after surgery.  MAKE SURE YOU:   Understand these instructions.   Get help right away if you are not doing well or get worse.    Thank you for letting us be a part of your medical care team.  It is a privilege we respect greatly.  We hope these instructions will help you stay on track for a fast and full recovery!    Information on my medicine - ELIQUIS (apixaban)  This medication education was reviewed with me or my healthcare representative as part of my discharge preparation.  The pharmacist that spoke with me during my hospital stay was:  Wayland Salinas, [facility]  Why was Eliquis prescribed for you? Eliquis was prescribed for you to reduce the risk of a blood clot forming that can cause a stroke if you have a medical condition called atrial fibrillation (a type of irregular heartbeat).  What do You need to know about Eliquis ? Take your Eliquis TWICE DAILY - one tablet in the morning and one tablet in the evening with or without food. If you have difficulty swallowing the tablet whole please discuss with your pharmacist how to take the  medication safely.  Take Eliquis exactly as prescribed by your doctor and DO NOT stop taking Eliquis without talking to the doctor who prescribed the medication.  Stopping may increase your risk of developing a stroke.  Refill your prescription before you run out.  After discharge, you should have regular check-up appointments with your healthcare provider that is prescribing your Eliquis.  In the future your dose may need to be changed if your kidney function or weight changes by a significant amount or as you get older.  What do you do if you miss a dose?  If you miss a dose, take it as soon as you remember on the same day and resume taking twice daily.  Do not take more than one dose of ELIQUIS at the same time to make up a missed dose.  Important Safety Information A possible side effect of Eliquis is bleeding. You should call your healthcare provider right away if you experience any of the following: ? Bleeding from an injury or your nose that does not stop. ? Unusual colored urine (red or dark brown) or unusual colored stools (red or black). ? Unusual bruising for unknown reasons. ? A serious fall or if you hit your head (even if there is no bleeding).  Some medicines may interact with Eliquis and might increase your risk of bleeding or clotting while on Eliquis. To help avoid this, consult your healthcare provider or pharmacist prior to using any new prescription or non-prescription medications, including herbals, vitamins, non-steroidal anti-inflammatory drugs (NSAIDs) and supplements.  This website has more information on Eliquis (apixaban): http://www.eliquis.com/eliquis/home

## 2014-08-29 NOTE — Clinical Social Work Note (Signed)
Patient scheduled to discharge today to [facility], however blood transfusion ordered and was not started until  after 3 pm. Per Dr. Coralyn Pear, patient will be able to d/c to facility on Sunday. Crystal, admissions staff with [facility] contacted and will be expecting patient on Sunday.  Deandre Brannan Givens, MSW, LCSW Licensed Clinical Social Worker [place] [phone]

## 2014-08-29 NOTE — Progress Notes (Signed)
     Subjective:  POD#2 IM nail of the L hip. Patient reports pain as mild.  Resting comfortably in bed.  Was able to work with PT yesterday.  Plan is for her to be transferred to [facility] place once cleared medically.   Objective:   VITALS:   Filed Vitals:   08/28/14 1146 08/28/14 1415 08/28/14 2020 08/29/14 0418  BP: 113/58 110/55 116/60 112/61  Pulse: 110 92 105 101  Temp: 98.2 F (36.8 C) 98 F (36.7 C) 98.6 F (37 C) 98.2 F (36.8 C)  TempSrc: Oral Oral Oral Oral  Resp: 17 17 18 18   Height:      Weight:      SpO2: 92% 93% 93% 93%    Neurologically intact ABD soft Neurovascular intact Sensation intact distally Intact pulses distally Dorsiflexion/Plantar flexion intact Incision: dressing C/D/I   Lab Results  Component Value Date   WBC 9.6 08/28/2014   HGB 8.5* 08/28/2014   HCT 25.2* 08/28/2014   MCV 95.1 08/28/2014   PLT 221 08/28/2014   BMET    Component Value Date/Time   NA 140 08/28/2014 0241   K 3.7 08/28/2014 0241   CL 105 08/28/2014 0241   CO2 27 08/28/2014 0241   GLUCOSE 130* 08/28/2014 0241   BUN 22* 08/28/2014 0241   CREATININE 0.67 08/28/2014 0241   CALCIUM 8.4* 08/28/2014 0241   GFRNONAA >60 08/28/2014 0241   GFRAA >60 08/28/2014 0241     Assessment/Plan: 2 Days Post-Op   Principal Problem:   Displaced intertrochanteric fracture of left femur Active Problems:   Dementia   Diabetes mellitus type II, uncontrolled   Fall   Atrial fibrillation with RVR (new onset)   HTN (hypertension)   Elevated d-dimer   Left displaced femoral neck fracture   Goiter   Goiter diffuse   Up with therapy WBAT in the LLE Eliquis 2.5mg  for DVT prophylaxis OK from ortho standpoint to d/c once stable from medicine standpoint.    Brittney Powell Brittney Powell 08/29/2014, 9:16 AM Cell [phone]

## 2014-08-29 NOTE — Progress Notes (Signed)
[facility] PROGRESS NOTE  [name] GDJ:[id number] DOB: 11/03/1927 DOA: 08/25/2014 PCP: [name], MD  Assessment/Plan: 1. Left hip fracture.  -Patient with history of dementia, unsteady gait, having a mechanical fall at home well getting out of bed to use restroom. -She was found to be in atrial fibrillation with rapid ventricular response for which cardiology was consulted. -A. fib now controlled, transthoracic echocardiogram showing preserved ejection fraction of 65%.  -On 08/27/2014 she was taken to the OR for intramedullary nail placement of left hip fracture.  -She was started on Eliquis 2.5 mg PO BID for Afib which would be adequate for DVT prophylaxis.  -Plan for discharge to [facility] in AM  2.  Atrial fibrillation rapid ventricular response -This represents newly recognized atrial fibrillation, unknown chronicity.  -She was transitioned to Cardizem 120 mg PO q daily, she remains rate controlled  3.  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 [name] of general surgery, who reviewed imaging studies and recommended further workup in the outpatient setting, including FNA. She does not appear to be having issues with swallowing. Satting upper 90's on room air.    4.  Acute blood loss anemia -Labs showing a Hg of 7.9 this morning likely secondzry to postoperative anemia -Patient typed and crossed and will be transfused 1 unit of PRBC's -Repeat CBC in am  5.  Hypertension -Patient started on a Cardizem drip as oral anti-hypertensive agents were held -Blood pressures stable  Code Status: Full code Family Communication: I spoke with family members present at bedside Disposition Plan: Transfer to SNF in am   Consultants:  Orthopedic  surgery  Cardiology   HPI/Subjective: Patient is a pleasant 81 year old female with a past medical history of hypertension, dyslipidemia, diabetes mellitus, admitted to medicine service on 08/25/2014 after having a fall at home. Patient reporting getting out of bed to use the restroom lost her balance and fell backwards. Imaging studies revealed the presence of a left hip fracture. In the emergency department shows found to be in atrial fibrillation with rapid ventricular response and started on a Cardizem drip. A CT scan of chest with IV contrast was negative for pulmonary emboli or any other acute abdomen abnormalities of the chest. Cardiology was consulted. 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 Endocrine workup showed a TSH within normal limits at 0.457 and a free T4 of 0.84. Case was discussed with Dr. [name] of general surgery who recommended further outpatient workup. He would likely obtain fine-needle biopsy.   Objective: Filed Vitals:   08/29/14 1458  BP: 122/62  Pulse: 96  Temp: 98.3 F (36.8 C)  Resp: 18    Intake/Output Summary (Last 24 hours) at 08/29/14 1605 Last data filed at 08/29/14 1502  Gross per 24 hour  Intake    390 ml  Output      0 ml  Net    390 ml   Filed Weights   08/25/14 1414 08/27/14 0452  Weight: 55.6 kg (122 lb 9.2 oz) 56.4 kg (124 lb 5.4 oz)    Exam:   General:  Patient does not appear to be in acute distress, she is confused  Cardiovascular: Irregular rate and rhythm, normal S1-S2  Respiratory: Normal respiratory effort, lungs overall clear to auscultation bilaterally  Abdomen: Soft nontender nondistended  Musculoskeletal:  Patient having pain with passive and active movement of left lower extremity, no extremity edema, surgical incision site clean  Data Reviewed: Basic Metabolic Panel:  Recent Labs Lab 08/25/14 0937 08/25/14 1537 08/26/14 0225 08/27/14 0214 08/28/14 0241  NA 137  --  137 138 140  K 4.1  --   4.3 3.8 3.7  CL 102  --  105 106 105  CO2 26  --  24 25 27   GLUCOSE 244*  --  134* 116* 130*  BUN 18  --  19 18 22*  CREATININE 0.77  --  0.71 0.61 0.67  CALCIUM 8.8* 8.8* 8.6* 8.5* 8.4*   Liver Function Tests:  Recent Labs Lab 08/25/14 1537  ALBUMIN 3.5   No results for input(s): LIPASE, AMYLASE in the last 168 hours. No results for input(s): AMMONIA in the last 168 hours. CBC:  Recent Labs Lab 08/25/14 0937 08/26/14 0225 08/27/14 0214 08/28/14 0241 08/29/14 0900  WBC 13.8* 13.4* 10.6* 9.6 8.1  NEUTROABS 8.2*  --   --   --   --   HGB 12.5 10.3* 9.4* 8.5* 7.9*  HCT 36.4 30.7* 28.2* 25.2* 23.6*  MCV 95.3 93.9 95.6 95.1 95.5  PLT 239 247 204 221 238   Cardiac Enzymes: No results for input(s): CKTOTAL, CKMB, CKMBINDEX, TROPONINI in the last 168 hours. BNP (last 3 results) No results for input(s): BNP in the last 8760 hours.  ProBNP (last 3 results) No results for input(s): PROBNP in the last 8760 hours.  CBG:  Recent Labs Lab 08/28/14 2007 08/28/14 2352 08/29/14 0405 08/29/14 0835 08/29/14 1142  GLUCAP 177* 223* 123* 239* 139*    Recent Results (from the past 240 hour(s))  Urine culture     Status: None   Collection Time: 08/25/14 12:40 PM  Result Value Ref Range Status   Specimen Description URINE, CLEAN CATCH  Final   Special Requests NONE  Final   Culture   Final    MULTIPLE SPECIES PRESENT, SUGGEST RECOLLECTION IF CLINICALLY INDICATED   Report Status 08/26/2014 FINAL  Final  Surgical pcr screen     Status: None   Collection Time: 08/27/14  4:48 AM  Result Value Ref Range Status   MRSA, PCR NEGATIVE NEGATIVE Final   Staphylococcus aureus NEGATIVE NEGATIVE Final    Comment:        The Xpert SA Assay (FDA approved for NASAL specimens in patients over 43 years of age), is one component of a comprehensive surveillance program.  Test performance has been validated by [facility] for patients greater than or equal to 4 year old. It is not  intended to diagnose infection nor to guide or monitor treatment.      Studies: No results found.  Scheduled Meds: . sodium chloride   Intravenous Once  . apixaban  2.5 mg Oral BID  . diltiazem  120 mg Oral Daily  . docusate sodium  100 mg Oral BID  . donepezil  10 mg Oral QHS  . ezetimibe  10 mg Oral Daily  . feeding supplement (GLUCERNA SHAKE)  237 mL Oral TID BM  . insulin aspart  0-9 Units Subcutaneous 6 times per day  . memantine  28 mg Oral Daily  . PARoxetine  20 mg Oral Daily   Continuous Infusions: . lactated ringers 50 mL/hr at 08/28/14 4742    Principal Problem:   Displaced intertrochanteric fracture of left femur Active Problems:   Dementia   Diabetes mellitus type II, uncontrolled   Fall  Atrial fibrillation with RVR (new onset)   HTN (hypertension)   Elevated d-dimer   Left displaced femoral neck fracture   Goiter   Goiter diffuse    Time spent: 20 min    Kelvin Cellar  [facility] Pager [phone]. If 7PM-7AM, please contact night-coverage at [email or web address], password [facility] 08/29/2014, 4:05 PM  LOS: 4 days

## 2014-08-30 DIAGNOSIS — W19XXXD Unspecified fall, subsequent encounter: Secondary | ICD-10-CM

## 2014-08-30 LAB — TYPE AND SCREEN
ABO/RH(D): A POS
ANTIBODY SCREEN: NEGATIVE
Unit division: 0

## 2014-08-30 LAB — GLUCOSE, CAPILLARY
Glucose-Capillary: 285 mg/dL — ABNORMAL HIGH (ref 65–99)
Glucose-Capillary: 141 mg/dL — ABNORMAL HIGH (ref 65–99)
Glucose-Capillary: 122 mg/dL — ABNORMAL HIGH (ref 65–99)

## 2014-08-30 NOTE — Progress Notes (Signed)
Increased blue bruising noted on inner aspect of L thigh. Passed on to day shift nurse in report.  Raliegh Ip RN

## 2014-08-30 NOTE — Clinical Social Work Placement (Signed)
   CLINICAL SOCIAL WORK PLACEMENT  NOTE  Date:  08/30/2014  Patient Details  Name: Brittney Powell MRN: [id number] Date of Birth: [birth year]  Clinical Social Work is seeking post-discharge placement for this patient at the [place] level of care (*CSW will initial, date and re-position this form in  chart as items are completed):  Yes   Patient/family provided with [place] Work Department's list of facilities offering this level of care within the geographic area requested by the patient (or if unable, by the patient's family).  Yes   Patient/family informed of their freedom to choose among providers that offer the needed level of care, that participate in Medicare, Medicaid or managed care program needed by the patient, have an available bed and are willing to accept the patient.  Yes   Patient/family informed of [facility]'s ownership interest in [facility] and [facility], as well as of the fact that they are under no obligation to receive care at these facilities.  PASRR submitted to EDS on 08/28/14     PASRR number received on 08/28/14     Existing PASRR number confirmed on       FL2 transmitted to all facilities in geographic area requested by pt/family on 08/28/14     FL2 transmitted to all facilities within larger geographic area on       Patient informed that his/her managed care company has contracts with or will negotiate with certain facilities, including the following:        Yes   Patient/family informed of bed offers received.  Patient chooses bed at [facility]     Physician recommends and patient chooses bed at      Patient to be transferred to [facility] on 08/30/14.  Patient to be transferred to facility by Ambulance [facility])     Patient family notified on 08/30/14 of transfer.  Name of family member notified:  Tomasita Morrow- Husband     PHYSICIAN Please prepare priority discharge summary, including  medications, Please sign FL2, Please prepare prescriptions     Additional Comment: Ok per MD for d/c today to [facility]. Husband [name] notified and he will go to SNF around 11:00 to sign admit papers.  EMS arranged.  Nursing notified to call report. No further CSW needs identified. CSW signing off.  Lorie Phenix. Murrell Redden [phone]      _______________________________________________ Williemae Area, LCSW 08/30/2014, 9:19 AM

## 2014-08-30 NOTE — Progress Notes (Signed)
     Subjective:  POD#3 IM nail of L hip. Patient reports pain as mild.  Resting comfortably in bed. Plan to discharge to SNF today.   Objective:   VITALS:   Filed Vitals:   08/29/14 1700 08/29/14 1758 08/29/14 2025 08/30/14 0444  BP: 112/70 110/55 121/62 120/57  Pulse: 104 102 96 85  Temp: 99 F (37.2 C) 98.8 F (37.1 C) 98.2 F (36.8 C) 97.5 F (36.4 C)  TempSrc: Oral Oral Oral Oral  Resp: 20 20 20 20   Height:      Weight:      SpO2: 91% 96% 97% 97%    Neurologically intact ABD soft Neurovascular intact Sensation intact distally Intact pulses distally Dorsiflexion/Plantar flexion intact Incision: dressing C/D/I   Lab Results  Component Value Date   WBC 8.1 08/29/2014   HGB 8.5* 08/29/2014   HCT 25.6* 08/29/2014   MCV 95.5 08/29/2014   PLT 238 08/29/2014   BMET    Component Value Date/Time   NA 140 08/28/2014 0241   K 3.7 08/28/2014 0241   CL 105 08/28/2014 0241   CO2 27 08/28/2014 0241   GLUCOSE 130* 08/28/2014 0241   BUN 22* 08/28/2014 0241   CREATININE 0.67 08/28/2014 0241   CALCIUM 8.4* 08/28/2014 0241   GFRNONAA >60 08/28/2014 0241   GFRAA >60 08/28/2014 0241     Assessment/Plan: 3 Days Post-Op   Principal Problem:   Displaced intertrochanteric fracture of left femur Active Problems:   Dementia   Diabetes mellitus type II, uncontrolled   Fall   Atrial fibrillation with RVR (new onset)   HTN (hypertension)   Elevated d-dimer   Left displaced femoral neck fracture   Goiter   Goiter diffuse   Up with therapy WBAT in the LLE Eliquis for DVT prophylaxis OK from ortho standpoint to d/c today.  We will see back as outpatient.    Kelly,Brittney Lelan Pons 08/30/2014, 10:20 AM Cell [phone]

## 2014-08-30 NOTE — Progress Notes (Signed)
Discharged to [facility] report called

## 2014-09-01 ENCOUNTER — Non-Acute Institutional Stay (SKILLED_NURSING_FACILITY): Payer: Medicare Other | Admitting: Internal Medicine

## 2014-09-01 DIAGNOSIS — F329 Major depressive disorder, single episode, unspecified: Secondary | ICD-10-CM

## 2014-09-01 DIAGNOSIS — F0390 Unspecified dementia without behavioral disturbance: Secondary | ICD-10-CM

## 2014-09-01 DIAGNOSIS — R32 Unspecified urinary incontinence: Secondary | ICD-10-CM

## 2014-09-01 DIAGNOSIS — R5381 Other malaise: Secondary | ICD-10-CM

## 2014-09-01 DIAGNOSIS — E119 Type 2 diabetes mellitus without complications: Secondary | ICD-10-CM

## 2014-09-01 DIAGNOSIS — D62 Acute posthemorrhagic anemia: Secondary | ICD-10-CM

## 2014-09-01 DIAGNOSIS — I4891 Unspecified atrial fibrillation: Secondary | ICD-10-CM

## 2014-09-01 DIAGNOSIS — S72002S Fracture of unspecified part of neck of left femur, sequela: Secondary | ICD-10-CM | POA: Diagnosis not present

## 2014-09-01 DIAGNOSIS — K5900 Constipation, unspecified: Secondary | ICD-10-CM | POA: Diagnosis not present

## 2014-09-01 DIAGNOSIS — F322 Major depressive disorder, single episode, severe without psychotic features: Secondary | ICD-10-CM | POA: Diagnosis not present

## 2014-09-01 DIAGNOSIS — E049 Nontoxic goiter, unspecified: Secondary | ICD-10-CM | POA: Diagnosis not present

## 2014-09-01 NOTE — Progress Notes (Signed)
Patient ID: Brittney Powell, female   DOB: January 13, 1928, 81 y.o.   MRN: [id number]     Facility: [facility]    PCP: [name], MD  Code Status: full code  Allergies  Allergen Reactions  . Penicillins     Chief Complaint  Patient presents with  . New Admit To SNF     HPI:  81 y.o. year old patient is here for short term rehabilitation post hospital admission from 08/25/14-08/30/14 post fall with left hip fracture. She also had afib with RVR and cardiology was consulted. She was started on cardizem drip. PE was ruled out by CT chest. 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 General surgery was consulted and recommended outpatient follow up for possible FNAC. Orthopedic was consulted and she underwent intramedullary nailing of her left hip. She is seen in her room today with her husband at bedside. She is weak and tired. Her pain is under control. As per therapy team, her heart rate has been in 130s with therapy. She denies any chest pain or palpitations.   Review of Systems:  Constitutional: Negative for fever, chills, diaphoresis.  HENT: Negative for headache, congestion, nasal discharge Eyes: Negative for eye pain, blurred vision, double vision and discharge.  Respiratory: Negative for cough, shortness of breath and wheezing.   Cardiovascular: Negative for chest pain, palpitations, leg swelling.  Gastrointestinal: Negative for heartburn, nausea, vomiting, abdominal pain Genitourinary: Negative for dysuria Musculoskeletal: Negative for back pain, falls in facility Skin: Negative for itching, rash.  Neurological: Negative for dizziness, tingling, focal weakness Psychiatric/Behavioral: Negative for depression. Has memory loss.    Past Medical History  Diagnosis Date  . DM (diabetes mellitus)   . HTN (hypertension)   . Hypercholesterolemia   . Depression   . Breast cancer 1980    right  . Intrauterine pessary   . Wrist fracture  05/2011  . Osteoporosis   . Memory difficulty 05/15/2013  . Abnormality of gait 05/15/2013  . Dementia   . TIA (transient ischemic attack)    Past Surgical History  Procedure Laterality Date  . Mastectomy modified radical Right     30 years ago  . Intramedullary (im) nail intertrochanteric Left 08/27/2014    Procedure: INTRAMEDULLARY (IM) NAIL INTERTROCHANTRIC;  Surgeon: Renette Butters, MD;  Location: [facility];  Service: Orthopedics;  Laterality: Left;   Social History:   reports that she has never smoked. She has never used smokeless tobacco. She reports that she does not drink alcohol or use illicit drugs.  Family History  Problem Relation Age of Onset  . Diabetes Father   . Cancer Father     leukemia  . Heart attack Brother   . Dementia Maternal Aunt     Medications: Patient's Medications  New Prescriptions   No medications on file  Previous Medications   APIXABAN (ELIQUIS) 2.5 MG TABS TABLET    Take 1 tablet (2.5 mg total) by mouth 2 (two) times daily.   CALCIUM-MAGNESIUM-ZINC 167-83-8 MG TABS    Take 1 tablet by mouth daily.   DILTIAZEM (CARDIZEM CD) 120 MG 24 HR CAPSULE    Take 1 capsule (120 mg total) by mouth daily.   DOCUSATE SODIUM (COLACE) 100 MG CAPSULE    Take 1 capsule (100 mg total) by mouth 2 (two) times daily.   DONEPEZIL (ARICEPT) 10 MG TABLET    Take 1 tablet (10 mg total) by mouth at bedtime.   EZETIMIBE (ZETIA) 10 MG TABLET  Take 10 mg by mouth daily.   HYDROCODONE-ACETAMINOPHEN (NORCO) 5-325 MG PER TABLET    Take 1-2 tablets by mouth every 6 (six) hours as needed for moderate pain.   MEMANTINE (NAMENDA XR) 28 MG CP24 24 HR CAPSULE    Take 1 capsule (28 mg total) by mouth daily.   MULTIPLE VITAMINS-MINERALS (CENTRUM SILVER PO)    Take 1 capsule by mouth 3 x daily with food.   ONDANSETRON (ZOFRAN) 4 MG TABLET    Take 1 tablet (4 mg total) by mouth every 8 (eight) hours as needed for nausea or vomiting.   OXYBUTYNIN (DITROPAN) 5 MG TABLET    Take 5 mg by mouth  2 (two) times daily.   PAROXETINE (PAXIL) 20 MG TABLET    Take 20 mg by mouth daily.   VITAMIN B-12 (CYANOCOBALAMIN) 1000 MCG TABLET    Take 1,000 mcg by mouth daily.  Modified Medications   No medications on file  Discontinued Medications   No medications on file     Physical Exam: Filed Vitals:   09/01/14 1305  BP: 131/79  Pulse: 111  Temp: 97 F (36.1 C)  Resp: 19  SpO2: 96%    General- elderly female, thin built, in no acute distress Head- normocephalic, atraumatic Nose- no maxillary or frontal sinus tenderness, no nasal discharge Throat- moist mucus membrane Eyes- no pallor, no icterus, no discharge, normal conjunctiva, normal sclera Neck- no cervical lymphadenopathy Cardiovascular- irregular heart rate, tachycardic, no murmurs, palpable dorsalis pedis and radial pulses, trace left leg edema Respiratory- bilateral clear to auscultation, no wheeze, no rhonchi, no crackles, no use of accessory muscles Abdomen- bowel sounds present, soft, non tender Musculoskeletal- able to move all 4 extremities, left leg ROM limited Neurological- no focal deficit, alert and oriented to person and place Skin- warm and dry, left leg surgical incision with aquacel dressing Psychiatry- normal mood and affect    Labs reviewed: Basic Metabolic Panel:  Recent Labs  08/26/14 0225 08/27/14 0214 08/28/14 0241  NA 137 138 140  K 4.3 3.8 3.7  CL 105 106 105  CO2 24 25 27   GLUCOSE 134* 116* 130*  BUN 19 18 22*  CREATININE 0.71 0.61 0.67  CALCIUM 8.6* 8.5* 8.4*   Liver Function Tests:  Recent Labs  08/25/14 1537  ALBUMIN 3.5   No results for input(s): LIPASE, AMYLASE in the last 8760 hours. No results for input(s): AMMONIA in the last 8760 hours. CBC:  Recent Labs  08/25/14 0937  08/27/14 0214 08/28/14 0241 08/29/14 0900 08/29/14 1928  WBC 13.8*  < > 10.6* 9.6 8.1  --   NEUTROABS 8.2*  --   --   --   --   --   HGB 12.5  < > 9.4* 8.5* 7.9* 8.5*  HCT 36.4  < > 28.2*  25.2* 23.6* 25.6*  MCV 95.3  < > 95.6 95.1 95.5  --   PLT 239  < > 204 221 238  --   < > = values in this interval not displayed. Cardiac Enzymes: No results for input(s): CKTOTAL, CKMB, CKMBINDEX, TROPONINI in the last 8760 hours. BNP: Invalid input(s): POCBNP CBG:  Recent Labs  08/30/14 0113 08/30/14 0442 08/30/14 0756  GLUCAP 285* 141* 122*   Lab Results  Component Value Date   HGBA1C 6.6* 08/25/2014    Radiological Exams: Ct Angio Chest Pe W/cm &/or Wo Cm  08/25/2014   CLINICAL DATA:  Elevated D-dimer.  Patient fell yesterday.  EXAM: CT ANGIOGRAPHY CHEST  WITH CONTRAST  TECHNIQUE: Multidetector CT imaging of the chest was performed using the standard protocol during bolus administration of intravenous contrast. Multiplanar CT image reconstructions and MIPs were obtained to evaluate the vascular anatomy.  CONTRAST:  37mL OMNIPAQUE IOHEXOL 350 MG/ML SOLN  COMPARISON:  None.  FINDINGS: There are fractures of the anterior lateral aspects of the left fourth, fifth and sixth ribs bilaterally, age indeterminate.  The patient has a huge goiter that extends substernal to the level of the carina measuring 10 x 6.4 x 6.4 cm. The trachea is slightly anteriorly deviated but not compressed. The esophagus is deviated and compressed.  There are no pulmonary emboli, infiltrates, or effusions. The patient has a large hiatal hernia containing the entire fundus of the stomach. There is secondary compressive atelectasis in the left lower lobe. There is chronic accentuation of the thoracic kyphosis. Extensive calcification thoracic aorta with moderate coronary artery calcification. Far all heart size is normal. The visualized portion of the upper abdomen is normal except for aortic atherosclerosis.  Review of the MIP images confirms the above findings.  IMPRESSION: 1. No pulmonary emboli or other acute abnormalities of the chest. 2. Huge substernal goiter. 3. Huge hiatal hernia which create secondary compressive  atelectasis in the left lower lobe. 4. Subtle fractures of the anterior aspects of the left fourth, fifth and sixth ribs bilaterally, age indeterminate.   Electronically Signed   By: Lorriane Shire M.D.   On: 08/25/2014 12:06   US Soft Tissue Head/neck  08/25/2014   CLINICAL DATA:  Assess thyroid masses as noted on CT. Initial encounter.  EXAM: THYROID ULTRASOUND  TECHNIQUE: Ultrasound examination of the thyroid gland and adjacent soft tissues was performed.  COMPARISON:  CTA of the chest performed earlier today at 11:43 a.m.  FINDINGS: Right thyroid lobe  Measurements: 6.1 x 3.2 x 3.0 cm.  Multiple nodules are seen.  Left thyroid lobe  Measurements: 7.3 x 3.7 x 2.8 cm. Multiple nodules are seen, with diffuse heterogeneity.  Isthmus  Thickness: 0.4 cm.  Grossly unremarkable in appearance.  Lymphadenopathy  None visualized.  The inferior aspect of the thyroid gland, including the largest masses noted on CTA, is not visualized on this study.  IMPRESSION: Bilateral thyroid nodules, larger on the left. The largest thyroid nodules, measuring up to 4.6 cm at the inferior aspect of the thyroid gland, are not well assessed on this study. Would correlate with thyroid function tests and consider nuclear medicine thyroid uptake and scan, as deemed clinically appropriate.   Electronically Signed   By: Garald Balding M.D.   On: 08/25/2014 20:27   Chest Portable 1 View  08/25/2014   CLINICAL DATA:  Preoperative examination prior to surgery on the left hip and femur, history of goiter, breast malignancy, and diabetes.  EXAM: PORTABLE CHEST - 1 VIEW  COMPARISON:  CT scan of the chest of August 25, 2014 chest x-ray of April 13, 2004.  FINDINGS: The right lung is mildly hyperinflated and clear. On the left there is subtle increased density that is at least in part due to a known large hiatal hernia-partially intra thoracic stomach. There is no significant pleural effusion. The heart is top-normal in size. The pulmonary  vascularity is normal. Soft tissue density in the paratracheal regions bilaterally is consistent with the known large goiter. The observed bony thorax exhibits no acute abnormality. The known left lateral rib fractures are not evident on today's study.  IMPRESSION: There is no acute cardiopulmonary abnormality. There is a  known large hiatal hernia -partially intra thoracic stomach and a large goiter.   Electronically Signed   By: David  Martinique M.D.   On: 08/25/2014 14:41   Dg Hip Unilat With Pelvis 2-3 Views Left  08/25/2014   CLINICAL DATA:  Hip pain, confusion  EXAM: LEFT HIP (WITH PELVIS) 2-3 VIEWS  COMPARISON:  None  FINDINGS: Osseous demineralization.  Displaced intertrochanteric fracture LEFT femur.  Narrowing of both hip joints without dislocation.  SI joints symmetric.  Atherosclerotic calcification aorta.  Pessary projects over pelvis.  IMPRESSION: Osseous demineralization with displaced intertrochanteric fracture LEFT femur.   Electronically Signed   By: Lavonia Dana M.D.   On: 08/25/2014 09:58    Assessment/Plan  Physical deconditioning Will have patient work with PT/OT as tolerated to regain strength and restore function.  Fall precautions are in place.  Left hip fracture S/p IM nailing. Will have her work with physical therapy and occupational therapy team to help with gait training and muscle strengthening exercises.fall precautions. Skin care. Encourage to be out of bed. Continue norco 5-325 1-2 tab q6h prn pain. Continue eliquis for dvt prophylaxis. Has follow up with orthopedics.  afib Irregular rate and tachycardic on exam. On diltiazem CD 120 mg daily at present. Increase this to 180 mg daily. Monitor vital signs. Continue eliquis 2.5 mg bid for anticoagulation  Blood loss anemia Post 1 u prbc transfusion, hb 8.5 at discharge. monitor h&h  Goitre Incidental finding on ct chest, seen by general surgery, thyroid function normal on labs, pending outpatient follow  up  Constipation Stable, continue colace 100 mg bid  Dementia At baseline per husband. Continue namenda xr 28 mg daily and aricept 10 mg daily, continue assistance with ADLs, skin and fall prophylaxis  Depression Continue paxil 20 mg daily  Urinary incontinence Continue ditropan 5 mg bid  Dm type 2  Recent a1c 6.6. amaryl was stopped in the hospital. Given her age and co-morbidities, will keep a1 c goal of 7 for now and monitor. One cbg reading of 340 noted. Check cbg bid in facility for now and provide SSI coverage for cbg >200 for now and reassess in 1 week.     Goals of care: short term rehabilitation   Labs/tests ordered: cbc, bmp  Family/ staff Communication: reviewed care plan with patient and nursing supervisor    Blanchie Serve, MD  [facility] [phone] (Monday-Friday 8 am - 5 pm) [phone] (afterhours)

## 2014-09-03 ENCOUNTER — Non-Acute Institutional Stay (SKILLED_NURSING_FACILITY): Payer: Medicare Other | Admitting: Internal Medicine

## 2014-09-03 DIAGNOSIS — I480 Paroxysmal atrial fibrillation: Secondary | ICD-10-CM

## 2014-09-03 DIAGNOSIS — F411 Generalized anxiety disorder: Secondary | ICD-10-CM | POA: Diagnosis not present

## 2014-09-03 LAB — BASIC METABOLIC PANEL
BUN: 27 mg/dL — AB (ref 4–21)
Creatinine: 0.4 mg/dL — AB (ref <=1.1)
GLUCOSE: 206 mg/dL
POTASSIUM: 4 mmol/L (ref 3.4–5.3)
Sodium: 141 mmol/L (ref 137–147)

## 2014-09-03 NOTE — Progress Notes (Signed)
Patient ID: Brittney Powell, female   DOB: June 13, 1927, 81 y.o.   MRN: [id number]     Facility: [facility]    PCP: [name], MD  Code Status: full code  Allergies  Allergen Reactions  . Penicillins     Chief Complaint  Patient presents with  . Acute Visit    anxiety, fear of falling     HPI:  81 y.o. year old patient is here for short term rehabilitation with left hip fracture post fall and afib with RVR. She has been working with therapy team but has been anxious in participating with therapy. Family has noticed resistance to care from staff due to anxiety. She is seen in her room. She denies being anxious but at the same time appears somewhat shaky and nervous.   Review of Systems:  Constitutional: Negative for fever, chills, diaphoresis.  HENT: Negative for headache, congestion Eyes: Negative for eye pain, blurred vision, double vision and discharge.  Respiratory: Negative for cough, shortness of breath and wheezing.   Cardiovascular: Negative for chest pain, palpitations, leg swelling.  Gastrointestinal: Negative for heartburn, nausea, vomiting, abdominal pain Neurological: Negative for dizziness, tingling, focal weakness Psychiatric/Behavioral: Negative for depression. Has memory loss.    Past Medical History  Diagnosis Date  . DM (diabetes mellitus)   . HTN (hypertension)   . Hypercholesterolemia   . Depression   . Breast cancer 1980    right  . Intrauterine pessary   . Wrist fracture 05/2011  . Osteoporosis   . Memory difficulty 05/15/2013  . Abnormality of gait 05/15/2013  . Dementia   . TIA (transient ischemic attack)     Medications: Patient's Medications  New Prescriptions   No medications on file  Previous Medications   APIXABAN (ELIQUIS) 2.5 MG TABS TABLET    Take 1 tablet (2.5 mg total) by mouth 2 (two) times daily.   CALCIUM-MAGNESIUM-ZINC 167-83-8 MG TABS    Take 1 tablet by mouth daily.   DILTIAZEM (CARDIZEM CD)  180 MG 24 HR CAPSULE    Take 180 mg by mouth daily.   DOCUSATE SODIUM (COLACE) 100 MG CAPSULE    Take 1 capsule (100 mg total) by mouth 2 (two) times daily.   DONEPEZIL (ARICEPT) 10 MG TABLET    Take 1 tablet (10 mg total) by mouth at bedtime.   EZETIMIBE (ZETIA) 10 MG TABLET    Take 10 mg by mouth daily.   HYDROCODONE-ACETAMINOPHEN (NORCO) 5-325 MG PER TABLET    Take 1-2 tablets by mouth every 6 (six) hours as needed for moderate pain.   MEMANTINE (NAMENDA XR) 28 MG CP24 24 HR CAPSULE    Take 1 capsule (28 mg total) by mouth daily.   MULTIPLE VITAMINS-MINERALS (CENTRUM SILVER PO)    Take 1 capsule by mouth 3 x daily with food.   ONDANSETRON (ZOFRAN) 4 MG TABLET    Take 1 tablet (4 mg total) by mouth every 8 (eight) hours as needed for nausea or vomiting.   OXYBUTYNIN (DITROPAN) 5 MG TABLET    Take 5 mg by mouth 2 (two) times daily.   PAROXETINE (PAXIL) 20 MG TABLET    Take 20 mg by mouth daily.   VITAMIN B-12 (CYANOCOBALAMIN) 1000 MCG TABLET    Take 1,000 mcg by mouth daily.  Modified Medications   No medications on file  Discontinued Medications   DILTIAZEM (CARDIZEM CD) 120 MG 24 HR CAPSULE    Take 1 capsule (120 mg total) by mouth daily.  Physical Exam: Filed Vitals:   09/03/14 1342  BP: 129/76  Pulse: 88  Temp: 98 F (36.7 C)  Resp: 16  SpO2: 95%    General- elderly female, thin built, frail, in no acute distress Head- normocephalic, atraumatic Throat- moist mucus membrane Eyes- no pallor, no icterus, no discharge, normal conjunctiva, normal sclera Neck- no cervical lymphadenopathy Cardiovascular- irregular heart rate, no murmurs, palpable dorsalis pedis and radial pulses, trace left leg edema Respiratory- bilateral clear to auscultation, no wheeze, no rhonchi, no crackles, no use of accessory muscles Abdomen- bowel sounds present, soft, non tender Musculoskeletal- able to move all 4 extremities, left leg ROM limited Neurological- no new focal deficit, alert and oriented  to person and place Psychiatry- nervousness noted   Labs reviewed: Basic Metabolic Panel:  Recent Labs  08/26/14 0225 08/27/14 0214 08/28/14 0241  NA 137 138 140  K 4.3 3.8 3.7  CL 105 106 105  CO2 24 25 27   GLUCOSE 134* 116* 130*  BUN 19 18 22*  CREATININE 0.71 0.61 0.67  CALCIUM 8.6* 8.5* 8.4*   Liver Function Tests:  Recent Labs  08/25/14 1537  ALBUMIN 3.5   No results for input(s): LIPASE, AMYLASE in the last 8760 hours. No results for input(s): AMMONIA in the last 8760 hours. CBC:  Recent Labs  08/25/14 0937  08/27/14 0214 08/28/14 0241 08/29/14 0900 08/29/14 1928  WBC 13.8*  < > 10.6* 9.6 8.1  --   NEUTROABS 8.2*  --   --   --   --   --   HGB 12.5  < > 9.4* 8.5* 7.9* 8.5*  HCT 36.4  < > 28.2* 25.2* 23.6* 25.6*  MCV 95.3  < > 95.6 95.1 95.5  --   PLT 239  < > 204 221 238  --   < > = values in this interval not displayed. Cardiac Enzymes: No results for input(s): CKTOTAL, CKMB, CKMBINDEX, TROPONINI in the last 8760 hours. BNP: Invalid input(s): POCBNP CBG:  Recent Labs  08/30/14 0113 08/30/14 0442 08/30/14 0756  GLUCAP 285* 141* 122*   Lab Results  Component Value Date   HGBA1C 6.6* 08/25/2014   Lab Results  Component Value Date   TSH 0.457 08/25/2014    Radiological Exams: Ct Angio Chest Pe W/cm &/or Wo Cm  08/25/2014   CLINICAL DATA:  Elevated D-dimer.  Patient fell yesterday.  EXAM: CT ANGIOGRAPHY CHEST WITH CONTRAST  TECHNIQUE: Multidetector CT imaging of the chest was performed using the standard protocol during bolus administration of intravenous contrast. Multiplanar CT image reconstructions and MIPs were obtained to evaluate the vascular anatomy.  CONTRAST:  68mL OMNIPAQUE IOHEXOL 350 MG/ML SOLN  COMPARISON:  None.  FINDINGS: There are fractures of the anterior lateral aspects of the left fourth, fifth and sixth ribs bilaterally, age indeterminate.  The patient has a huge goiter that extends substernal to the level of the carina  measuring 10 x 6.4 x 6.4 cm. The trachea is slightly anteriorly deviated but not compressed. The esophagus is deviated and compressed.  There are no pulmonary emboli, infiltrates, or effusions. The patient has a large hiatal hernia containing the entire fundus of the stomach. There is secondary compressive atelectasis in the left lower lobe. There is chronic accentuation of the thoracic kyphosis. Extensive calcification thoracic aorta with moderate coronary artery calcification. Far all heart size is normal. The visualized portion of the upper abdomen is normal except for aortic atherosclerosis.  Review of the MIP images confirms the above findings.  IMPRESSION: 1. No pulmonary emboli or other acute abnormalities of the chest. 2. Huge substernal goiter. 3. Huge hiatal hernia which create secondary compressive atelectasis in the left lower lobe. 4. Subtle fractures of the anterior aspects of the left fourth, fifth and sixth ribs bilaterally, age indeterminate.   Electronically Signed   By: Lorriane Shire M.D.   On: 08/25/2014 12:06   US Soft Tissue Head/neck  08/25/2014   CLINICAL DATA:  Assess thyroid masses as noted on CT. Initial encounter.  EXAM: THYROID ULTRASOUND  TECHNIQUE: Ultrasound examination of the thyroid gland and adjacent soft tissues was performed.  COMPARISON:  CTA of the chest performed earlier today at 11:43 a.m.  FINDINGS: Right thyroid lobe  Measurements: 6.1 x 3.2 x 3.0 cm.  Multiple nodules are seen.  Left thyroid lobe  Measurements: 7.3 x 3.7 x 2.8 cm. Multiple nodules are seen, with diffuse heterogeneity.  Isthmus  Thickness: 0.4 cm.  Grossly unremarkable in appearance.  Lymphadenopathy  None visualized.  The inferior aspect of the thyroid gland, including the largest masses noted on CTA, is not visualized on this study.  IMPRESSION: Bilateral thyroid nodules, larger on the left. The largest thyroid nodules, measuring up to 4.6 cm at the inferior aspect of the thyroid gland, are not well  assessed on this study. Would correlate with thyroid function tests and consider nuclear medicine thyroid uptake and scan, as deemed clinically appropriate.   Electronically Signed   By: Garald Balding M.D.   On: 08/25/2014 20:27   Chest Portable 1 View  08/25/2014   CLINICAL DATA:  Preoperative examination prior to surgery on the left hip and femur, history of goiter, breast malignancy, and diabetes.  EXAM: PORTABLE CHEST - 1 VIEW  COMPARISON:  CT scan of the chest of August 25, 2014 chest x-ray of April 13, 2004.  FINDINGS: The right lung is mildly hyperinflated and clear. On the left there is subtle increased density that is at least in part due to a known large hiatal hernia-partially intra thoracic stomach. There is no significant pleural effusion. The heart is top-normal in size. The pulmonary vascularity is normal. Soft tissue density in the paratracheal regions bilaterally is consistent with the known large goiter. The observed bony thorax exhibits no acute abnormality. The known left lateral rib fractures are not evident on today's study.  IMPRESSION: There is no acute cardiopulmonary abnormality. There is a known large hiatal hernia -partially intra thoracic stomach and a large goiter.   Electronically Signed   By: David  Martinique M.D.   On: 08/25/2014 14:41   Dg Hip Unilat With Pelvis 2-3 Views Left  08/25/2014   CLINICAL DATA:  Hip pain, confusion  EXAM: LEFT HIP (WITH PELVIS) 2-3 VIEWS  COMPARISON:  None  FINDINGS: Osseous demineralization.  Displaced intertrochanteric fracture LEFT femur.  Narrowing of both hip joints without dislocation.  SI joints symmetric.  Atherosclerotic calcification aorta.  Pessary projects over pelvis.  IMPRESSION: Osseous demineralization with displaced intertrochanteric fracture LEFT femur.   Electronically Signed   By: Lavonia Dana M.D.   On: 08/25/2014 09:58    Assessment/Plan  GAD Currently on paxil 20 mg po daily. Increase this to 30 mg po daily for now. Will have  her on xanax 0.25 mg daily prn only for severe anxiety during care given her high fall risk  afib Better controlled heart rate noted today. continue diltiazem CD 180 mg daily with eliquis and monitor    Blanchie Serve, MD  [facility]  [phone] (Monday-Friday 8 am - 5 pm) [phone] (afterhours)

## 2014-09-09 ENCOUNTER — Non-Acute Institutional Stay (SKILLED_NURSING_FACILITY): Payer: Medicare Other | Admitting: Nurse Practitioner

## 2014-09-09 DIAGNOSIS — F411 Generalized anxiety disorder: Secondary | ICD-10-CM | POA: Diagnosis not present

## 2014-09-09 DIAGNOSIS — I4891 Unspecified atrial fibrillation: Secondary | ICD-10-CM

## 2014-09-09 DIAGNOSIS — E1165 Type 2 diabetes mellitus with hyperglycemia: Secondary | ICD-10-CM

## 2014-09-09 DIAGNOSIS — IMO0002 Reserved for concepts with insufficient information to code with codable children: Secondary | ICD-10-CM

## 2014-09-09 NOTE — Progress Notes (Signed)
Patient ID: Brittney Powell, female   DOB: [birth year], 81 y.o.   MRN: [id number]    Nursing Home Location:  [place] of Service: SNF (31)  PCP: [name], MD  Allergies  Allergen Reactions  . Penicillins     Chief Complaint  Patient presents with  . Acute Visit    HPI:  Patient is a 81 y.o. female seen today at [facility] for acute visit. Pt with pmh of DM, HTN, hyperlipidemia, depression, OP, dementia, hip fracture was seen on 09/01/14 by Dr Bubba Camp due to increase HR and anxiety. Cardizem was increased and paxil increased.  Pts heart rate remains elevated 100-125 at times. Pt denies chest pains or palpitations.  Pt reports anxiety has improved.  Blood sugars have also been elevated, amaryl was discontinued in hospital. Pt blood sugars in the am ranging from 120-200 and receiving SSI ACHS due to high blood sugars. Some as high has 350 in the evening.   Review of Systems:  Review of Systems  Constitutional: Negative for activity change, appetite change, fatigue and unexpected weight change.  HENT: Negative for congestion and hearing loss.   Eyes: Negative.   Respiratory: Negative for cough and shortness of breath.   Cardiovascular: Negative for chest pain, palpitations and leg swelling.  Gastrointestinal: Negative for abdominal pain, diarrhea and constipation.  Genitourinary: Negative for dysuria and difficulty urinating.  Musculoskeletal: Negative for myalgias and arthralgias.  Skin: Negative for color change and wound.  Neurological: Negative for dizziness and weakness.  Psychiatric/Behavioral: Positive for confusion. Negative for behavioral problems and agitation.    Past Medical History  Diagnosis Date  . DM (diabetes mellitus)   . HTN (hypertension)   . Hypercholesterolemia   . Depression   . Breast cancer 1980    right  . Intrauterine pessary   . Wrist fracture 05/2011  . Osteoporosis   . Memory difficulty  05/15/2013  . Abnormality of gait 05/15/2013  . Dementia   . TIA (transient ischemic attack)    Past Surgical History  Procedure Laterality Date  . Mastectomy modified radical Right     30 years ago  . Intramedullary (im) nail intertrochanteric Left 08/27/2014    Procedure: INTRAMEDULLARY (IM) NAIL INTERTROCHANTRIC;  Surgeon: Renette Butters, MD;  Location: [place];  Service: Orthopedics;  Laterality: Left;   Social History:   reports that she has never smoked. She has never used smokeless tobacco. She reports that she does not drink alcohol or use illicit drugs.  Family History  Problem Relation Age of Onset  . Diabetes Father   . Cancer Father     leukemia  . Heart attack Brother   . Dementia Maternal Aunt     Medications: Patient's Medications  New Prescriptions   No medications on file  Previous Medications   APIXABAN (ELIQUIS) 2.5 MG TABS TABLET    Take 1 tablet (2.5 mg total) by mouth 2 (two) times daily.   CALCIUM-MAGNESIUM-ZINC 167-83-8 MG TABS    Take 1 tablet by mouth daily.   DILTIAZEM (CARDIZEM CD) 180 MG 24 HR CAPSULE    Take 180 mg by mouth daily.   DOCUSATE SODIUM (COLACE) 100 MG CAPSULE    Take 1 capsule (100 mg total) by mouth 2 (two) times daily.   DONEPEZIL (ARICEPT) 10 MG TABLET    Take 1 tablet (10 mg total) by mouth at bedtime.   EZETIMIBE (ZETIA) 10 MG TABLET    Take 10  mg by mouth daily.   HYDROCODONE-ACETAMINOPHEN (NORCO) 5-325 MG PER TABLET    Take 1-2 tablets by mouth every 6 (six) hours as needed for moderate pain.   MEMANTINE (NAMENDA XR) 28 MG CP24 24 HR CAPSULE    Take 1 capsule (28 mg total) by mouth daily.   MULTIPLE VITAMINS-MINERALS (CENTRUM SILVER PO)    Take 1 capsule by mouth 3 x daily with food.   ONDANSETRON (ZOFRAN) 4 MG TABLET    Take 1 tablet (4 mg total) by mouth every 8 (eight) hours as needed for nausea or vomiting.   OXYBUTYNIN (DITROPAN) 5 MG TABLET    Take 5 mg by mouth 2 (two) times daily.   PAROXETINE (PAXIL) 20 MG TABLET    Take  30 mg by mouth daily.    VITAMIN B-12 (CYANOCOBALAMIN) 1000 MCG TABLET    Take 1,000 mcg by mouth daily.  Modified Medications   No medications on file  Discontinued Medications   No medications on file     Physical Exam: Filed Vitals:   09/09/14 1151  BP: 132/76  Pulse: 106  Temp: 98.2 F (36.8 C)  Resp: 20    Physical Exam  Constitutional: She is oriented to person, place, and time. No distress.  HENT:  Head: Normocephalic and atraumatic.  Mouth/Throat: Oropharynx is clear and moist. No oropharyngeal exudate.  Eyes: Conjunctivae are normal. Pupils are equal, round, and reactive to light.  Neck: Normal range of motion. Neck supple.  Cardiovascular: Normal rate and normal heart sounds.  An irregular rhythm present.  Pulmonary/Chest: Effort normal and breath sounds normal.  Abdominal: Soft. Bowel sounds are normal.  Musculoskeletal: She exhibits no edema or tenderness.  Neurological: She is alert and oriented to person, place, and time.  Skin: Skin is warm and dry. She is not diaphoretic.  Psychiatric: She has a normal mood and affect.    Labs reviewed: Basic Metabolic Panel:  Recent Labs  08/26/14 0225 08/27/14 0214 08/28/14 0241  NA 137 138 140  K 4.3 3.8 3.7  CL 105 106 105  CO2 24 25 27   GLUCOSE 134* 116* 130*  BUN 19 18 22*  CREATININE 0.71 0.61 0.67  CALCIUM 8.6* 8.5* 8.4*   Liver Function Tests:  Recent Labs  08/25/14 1537  ALBUMIN 3.5   No results for input(s): LIPASE, AMYLASE in the last 8760 hours. No results for input(s): AMMONIA in the last 8760 hours. CBC:  Recent Labs  08/25/14 0937  08/27/14 0214 08/28/14 0241 08/29/14 0900 08/29/14 1928  WBC 13.8*  < > 10.6* 9.6 8.1  --   NEUTROABS 8.2*  --   --   --   --   --   HGB 12.5  < > 9.4* 8.5* 7.9* 8.5*  HCT 36.4  < > 28.2* 25.2* 23.6* 25.6*  MCV 95.3  < > 95.6 95.1 95.5  --   PLT 239  < > 204 221 238  --   < > = values in this interval not displayed. TSH:  Recent Labs   08/25/14 1307 08/25/14 1537  TSH 0.545 0.457   A1C: Lab Results  Component Value Date   HGBA1C 6.6* 08/25/2014   Lipid Panel:  Recent Labs  08/26/14 0225  CHOL 180  HDL 46  LDLCALC 113*  TRIG 107  CHOLHDL 3.9     Assessment/Plan 1. Atrial fibrillation with RVR (new onset) Increased heart rate, blood pressures are stable. Will increase Cardizem to 240 CD at this time and monitor  2. Anxiety state Paxil recently increased with xanax as needed, anxiety overall has improved per staff and pt  3. Diabetes mellitus type II, uncontrolled -blood sugars elevated, requiring SSI with meals and blood glucose into the 350s, with SSI -pt was previously on amaryl 4 mg prior to hospitalization, will restart Amaryl 2 mg at this time.     Carlos American. Harle Battiest  [facility] [phone] 8 am - 5 pm) [phone] (after hours)

## 2014-09-10 LAB — CBC AND DIFFERENTIAL
HCT: 32 % — AB (ref 36–46)
Hemoglobin: 10.6 g/dL — AB (ref 12.0–16.0)
Platelets: 509 10*3/uL — AB (ref 150–399)
WBC: 8.8 10*3/mL

## 2014-09-14 NOTE — Progress Notes (Signed)
HPI: follow-up atrial fibrillation. Patient recently admitted after falling and fracturing hip. She was noted to be in atrial fibrillation. She was treated with Cardizem for rate control and apixaban. Echocardiogram July 2016 showed normal LV function and mild biatrial enlargement. Thyroid ultrasound showed nodules largest being 4.6 cm. Chest CT showed no pulmonary embolus. TSH and free T4 normal. Since discharge, she is recovering from her hip fracture. There is no significant dyspnea, chest pain, palpitations, syncope or bleeding. Problems with memory.  Current Outpatient Prescriptions  Medication Sig Dispense Refill  . apixaban (ELIQUIS) 2.5 MG TABS tablet Take 1 tablet (2.5 mg total) by mouth 2 (two) times daily. 60 tablet 60  . diltiazem (CARDIZEM CD) 180 MG 24 hr capsule Take 180 mg by mouth daily.    [name] docusate sodium (COLACE) 100 MG capsule Take 1 capsule (100 mg total) by mouth 2 (two) times daily. 10 capsule 0  . donepezil (ARICEPT) 10 MG tablet Take 1 tablet (10 mg total) by mouth at bedtime. 90 tablet 3  . ezetimibe (ZETIA) 10 MG tablet Take 10 mg by mouth daily.    [name] HYDROcodone-acetaminophen (NORCO) 5-325 MG per tablet Take 1-2 tablets by mouth every 6 (six) hours as needed for moderate pain. 90 tablet 0  . memantine (NAMENDA XR) 28 MG CP24 24 hr capsule Take 1 capsule (28 mg total) by mouth daily. 90 capsule 1  . ondansetron (ZOFRAN) 4 MG tablet Take 1 tablet (4 mg total) by mouth every 8 (eight) hours as needed for nausea or vomiting. 20 tablet 0  . oxybutynin (DITROPAN) 5 MG tablet Take 5 mg by mouth 2 (two) times daily.    [name] PARoxetine (PAXIL) 20 MG tablet Take 30 mg by mouth daily.      No current facility-administered medications for this visit.     Past Medical History  Diagnosis Date  . DM (diabetes mellitus)   . HTN (hypertension)   . Hypercholesterolemia   . Depression   . Breast cancer 1980    right  . Intrauterine pessary   . Wrist fracture 05/2011  .  Osteoporosis   . Memory difficulty 05/15/2013  . Abnormality of gait 05/15/2013  . Dementia   . TIA (transient ischemic attack)     Past Surgical History  Procedure Laterality Date  . Mastectomy modified radical Right     30 years ago  . Intramedullary (im) nail intertrochanteric Left 08/27/2014    Procedure: INTRAMEDULLARY (IM) NAIL INTERTROCHANTRIC;  Surgeon: Renette Butters, MD;  Location: [place];  Service: Orthopedics;  Laterality: Left;    History   Social History  . Marital Status: Married    Spouse Name: N/A  . Number of Children: 2  . Years of Education: 9TH   Occupational History  . Retired    Social History Main Topics  . Smoking status: Never Smoker   . Smokeless tobacco: Never Used  . Alcohol Use: No  . Drug Use: No  . Sexual Activity: Not on file   Other Topics Concern  . Not on file   Social History Narrative    ROS: no fevers or chills, productive cough, hemoptysis, dysphasia, odynophagia, melena, hematochezia, dysuria, hematuria, rash, seizure activity, orthopnea, PND, pedal edema, claudication. Remaining systems are negative.  Physical Exam: Well-developed well-nourished in no acute distress.  Skin is warm and dry.  HEENT is normal.  Neck is supple.  Chest is clear to auscultation with normal expansion.  Cardiovascular exam is irregular  Abdominal exam nontender or distended. No masses palpated. Extremities show no edema. neuro grossly intact

## 2014-09-15 ENCOUNTER — Ambulatory Visit (INDEPENDENT_AMBULATORY_CARE_PROVIDER_SITE_OTHER): Payer: Medicare Other | Admitting: Cardiology

## 2014-09-15 ENCOUNTER — Encounter: Payer: Self-pay | Admitting: Cardiology

## 2014-09-15 VITALS — BP 112/74 | HR 92 | Ht 65.0 in | Wt 128.0 lb

## 2014-09-15 DIAGNOSIS — I4891 Unspecified atrial fibrillation: Secondary | ICD-10-CM | POA: Diagnosis not present

## 2014-09-15 DIAGNOSIS — I10 Essential (primary) hypertension: Secondary | ICD-10-CM | POA: Diagnosis not present

## 2014-09-15 DIAGNOSIS — E049 Nontoxic goiter, unspecified: Secondary | ICD-10-CM | POA: Diagnosis not present

## 2014-09-15 LAB — BASIC METABOLIC PANEL WITH GFR
BUN: 17 mg/dL (ref 7–25)
CALCIUM: 9.3 mg/dL (ref 8.6–10.4)
CO2: 23 mEq/L (ref 20–31)
Chloride: 104 mEq/L (ref 98–110)
Creat: 0.72 mg/dL (ref 0.60–0.88)
GFR, EST NON AFRICAN AMERICAN: 77 mL/min (ref >=60)
GFR, Est African American: 89 mL/min (ref >=60)
GLUCOSE: 188 mg/dL — AB (ref 65–99)
Potassium: 4.1 mEq/L (ref 3.5–5.3)
Sodium: 139 mEq/L (ref 135–146)

## 2014-09-15 LAB — CBC
HCT: 38.5 % (ref 36.0–46.0)
Hemoglobin: 12.4 g/dL (ref 12.0–15.0)
MCH: 31.6 pg (ref 26.0–34.0)
MCHC: 32.2 g/dL (ref 30.0–36.0)
MCV: 98.2 fL (ref 78.0–100.0)
MPV: 9.1 fL (ref 8.6–12.4)
Platelets: 499 10*3/uL — ABNORMAL HIGH (ref 150–400)
RBC: 3.92 MIL/uL (ref 3.87–5.11)
RDW: 16.1 % — AB (ref 11.5–15.5)
WBC: 10.4 10*3/uL (ref 4.0–10.5)

## 2014-09-15 NOTE — Assessment & Plan Note (Signed)
Patient remains in permanent atrial fibrillation on examination. Continue Cardizem for rate control. Continue apixaban. If she has further falls in the future we would need to consider discontinuing as risk may outweigh benefit. Check hemoglobin and renal function.

## 2014-09-15 NOTE — Patient Instructions (Signed)
Your physician wants you to follow-up in: 6 MONTHS WITH DR CRENSHAW You will receive a reminder letter in the mail two months in advance. If you don't receive a letter, please call our office to schedule the follow-up appointment.   Your physician recommends that you HAVE LAB WORK TODAY 

## 2014-09-15 NOTE — Assessment & Plan Note (Signed)
I have asked them to follow up with primary care concerning her previous hiatal hernia and goiter.

## 2014-09-15 NOTE — Assessment & Plan Note (Signed)
Blood pressure controlled. Continue present medications. 

## 2014-09-23 ENCOUNTER — Ambulatory Visit (INDEPENDENT_AMBULATORY_CARE_PROVIDER_SITE_OTHER): Payer: Medicare Other | Admitting: Adult Health

## 2014-09-23 ENCOUNTER — Encounter: Payer: Self-pay | Admitting: Adult Health

## 2014-09-23 VITALS — BP 119/70 | HR 115 | Ht 65.0 in | Wt 125.0 lb

## 2014-09-23 DIAGNOSIS — R269 Unspecified abnormalities of gait and mobility: Secondary | ICD-10-CM | POA: Diagnosis not present

## 2014-09-23 DIAGNOSIS — F0390 Unspecified dementia without behavioral disturbance: Secondary | ICD-10-CM

## 2014-09-23 NOTE — Patient Instructions (Addendum)
Memory score has declined. Will continue to monitor. Cotinue Aricept and namenda.  If your symptoms worsen or you develop new symptoms please let us know.

## 2014-09-23 NOTE — Progress Notes (Signed)
I have read the note, and I agree with the clinical assessment and plan.  Bobbette Eakes KEITH   

## 2014-09-23 NOTE — Progress Notes (Signed)
PATIENT: Brittney Powell DOB: 1927-06-22  REASON FOR VISIT: follow up- dementia, gait disorder HISTORY FROM: patient  HISTORY OF PRESENT ILLNESS: Brittney Powell is an 81 year old female with a history of rapidly progressing dementia. She returns today for follow-up. The patient continues to take Aricept and Namenda and tolerates it well. The patient husband is with her at the visit today. He reports that her memory has gotten worse. Since the last visit the patient had a fall in July breaking her left hip. She has surgery and has been an Occupational psychologist place for rehabilitation. The patient still has trouble ambulating and is in a wheelchair. The family is considering placement in a memory care unit. She requires assistance with ADLs. She no longer operates a motor vehicle. Denies any significant agitation. The patient is sleeping well at night. The husband reports that she does mention thoughts of harming herself. The patient states that sometimes she gets depressed and says those things however she never has the intention to harm herself or others. She is currently on Paxil.  Denies any new neurological symptoms. The husband states that after her surgery she entered atrial fibrillation. She is currently on Eliquis and Cardizem. Returns today for an evaluation.  HISTORY 03/23/14: Brittney Powell is an 81 year old female with a history of rapidly progressive dementia. She returns today for follow-up. She is currently taking Aricept and Namenda. Patient also has a gait disorder- Physical therapy was recommended but people coming into the patient's home caused anxiety. Denies any falls since the last visit but has had some stumbles. Husband states that her memory has gotten worse. She needs assistance with ADLS. She does not operate a motor vehicle. Husband does all the cooking and fiances. Patient will get very anxious and that will cause some agitation. Denies any aggression. Patient is sleeping well at night according to  the husband. He does states that she may move her hands or reach for things while sleeping. No new medical issues since last seen.   REVIEW OF SYSTEMS: Out of a complete 14 system review of symptoms, the patient complains only of the following symptoms, and all other reviewed systems are negative.  Memory loss, speech difficulty, weakness, confusion, nervous/anxious, suicidal falls, walking difficulty, sleep talking, acting out dreams, constipation, trouble swallowing  ALLERGIES: Allergies  Allergen Reactions  . Penicillins     HOME MEDICATIONS: Outpatient Prescriptions Prior to Visit  Medication Sig Dispense Refill  . apixaban (ELIQUIS) 2.5 MG TABS tablet Take 1 tablet (2.5 mg total) by mouth 2 (two) times daily. 60 tablet 60  . diltiazem (CARDIZEM CD) 180 MG 24 hr capsule Take 180 mg by mouth daily.    [name] docusate sodium (COLACE) 100 MG capsule Take 1 capsule (100 mg total) by mouth 2 (two) times daily. 10 capsule 0  . donepezil (ARICEPT) 10 MG tablet Take 1 tablet (10 mg total) by mouth at bedtime. 90 tablet 3  . ezetimibe (ZETIA) 10 MG tablet Take 10 mg by mouth daily.    [name] HYDROcodone-acetaminophen (NORCO) 5-325 MG per tablet Take 1-2 tablets by mouth every 6 (six) hours as needed for moderate pain. 90 tablet 0  . memantine (NAMENDA XR) 28 MG CP24 24 hr capsule Take 1 capsule (28 mg total) by mouth daily. 90 capsule 1  . ondansetron (ZOFRAN) 4 MG tablet Take 1 tablet (4 mg total) by mouth every 8 (eight) hours as needed for nausea or vomiting. 20 tablet 0  . oxybutynin (DITROPAN) 5 MG  tablet Take 5 mg by mouth 2 (two) times daily.    [name] PARoxetine (PAXIL) 20 MG tablet Take 30 mg by mouth daily.      No facility-administered medications prior to visit.    PAST MEDICAL HISTORY: Past Medical History  Diagnosis Date  . DM (diabetes mellitus)   . HTN (hypertension)   . Hypercholesterolemia   . Depression   . Breast cancer 1980    right  . Intrauterine pessary   . Wrist fracture  05/2011  . Osteoporosis   . Memory difficulty 05/15/2013  . Abnormality of gait 05/15/2013  . Dementia   . TIA (transient ischemic attack)     PAST SURGICAL HISTORY: Past Surgical History  Procedure Laterality Date  . Mastectomy modified radical Right     30 years ago  . Intramedullary (im) nail intertrochanteric Left 08/27/2014    Procedure: INTRAMEDULLARY (IM) NAIL INTERTROCHANTRIC;  Surgeon: Renette Butters, MD;  Location: [place];  Service: Orthopedics;  Laterality: Left;    FAMILY HISTORY: Family History  Problem Relation Age of Onset  . Diabetes Father   . Cancer Father     leukemia  . Heart attack Brother   . Dementia Maternal Aunt     SOCIAL HISTORY: History   Social History  . Marital Status: Married    Spouse Name: N/A  . Number of Children: 2  . Years of Education: 9TH   Occupational History  . Retired    Social History Main Topics  . Smoking status: Never Smoker   . Smokeless tobacco: Never Used  . Alcohol Use: No  . Drug Use: No  . Sexual Activity: Not on file   Other Topics Concern  . Not on file   Social History Narrative      PHYSICAL EXAM  Filed Vitals:   09/23/14 1359  BP: 119/70  Pulse: 115  Height: 5\' 5"  (1.651 m)  Weight: 125 lb (56.7 kg)   Body mass index is 20.8 kg/(m^2).  Generalized: Well developed, in no acute distress   Neurological examination  Mentation: Alert. Follows all commands speech and language fluent. MMSE Cranial nerve II-XII: Pupils were equal round reactive to light. Extraocular movements were full, visual field were full on confrontational test. Facial sensation and strength were normal. Uvula tongue midline. Head turning and shoulder shrug  were normal and symmetric. Motor: The motor testing reveals 5 over 5 strength of all 4 extremities. Good symmetric motor tone is noted throughout.  Sensory: Sensory testing is intact to soft touch on all 4 extremities. No evidence of extinction is noted.  Coordination:  Cerebellar testing reveals good finger-nose-finger and heel-to-shin bilaterally.  Gait and station: Gait is normal. Tandem gait is normal. Romberg is negative. No drift is seen.  Reflexes: Deep tendon reflexes are symmetric and normal bilaterally.   DIAGNOSTIC DATA (LABS, IMAGING, TESTING) - I reviewed patient records, labs, notes, testing and imaging myself where available.  Lab Results  Component Value Date   WBC 10.4 09/15/2014   HGB 12.4 09/15/2014   HCT 38.5 09/15/2014   MCV 98.2 09/15/2014   PLT 499* 09/15/2014      Component Value Date/Time   NA 139 09/15/2014 1504   K 4.1 09/15/2014 1504   CL 104 09/15/2014 1504   CO2 23 09/15/2014 1504   GLUCOSE 188* 09/15/2014 1504   BUN 17 09/15/2014 1504   CREATININE 0.72 09/15/2014 1504   CREATININE 0.67 08/28/2014 0241   CALCIUM 9.3 09/15/2014 1504   ALBUMIN  3.5 08/25/2014 1537   GFRNONAA 77 09/15/2014 1504   GFRNONAA >60 08/28/2014 0241   GFRAA 89 09/15/2014 1504   GFRAA >60 08/28/2014 0241   Lab Results  Component Value Date   CHOL 180 08/26/2014   HDL 46 08/26/2014   LDLCALC 113* 08/26/2014   TRIG 107 08/26/2014   CHOLHDL 3.9 08/26/2014   Lab Results  Component Value Date   HGBA1C 6.6* 08/25/2014   No results found for: [id number] Lab Results  Component Value Date   TSH 0.457 08/25/2014      ASSESSMENT AND PLAN 81 y.o. year old female  has a past medical history of DM (diabetes mellitus); HTN (hypertension); Hypercholesterolemia; Depression; Breast cancer (1980); Intrauterine pessary; Wrist fracture (05/2011); Osteoporosis; Memory difficulty (05/15/2013); Abnormality of gait (05/15/2013); Dementia; and TIA (transient ischemic attack). here with:  1. Dementia 2. Gait disorder  The patient's memory has continued to decline. Her MMSE today is 6/30 was previously 16/30. She will continue on Aricept and Namenda for now. The patient does report some depression she is currently on Paxil. This may need to be adjusted  if her depression persists or worsens. If the patient's symptoms worsen or she develops new symptoms she should let us know. She will follow-up in 6 months or sooner if needed  Ward Givens, MSN, NP-C 09/23/2014, 1:53 PM [facility] [address], [place], [place] [address] [phone]  Note: This document was prepared with digital dictation and possible smart phrase technology. Any transcriptional errors that result from this process are unintentional.

## 2014-10-08 LAB — HEPATIC FUNCTION PANEL
ALK PHOS: 143 U/L — AB (ref 25–125)
ALT: 11 U/L (ref 7–35)
AST: 14 U/L (ref 13–35)
Bilirubin, Direct: 0.1 mg/dL (ref 0.01–0.4)
Bilirubin, Total: 0.5 mg/dL

## 2014-10-08 LAB — LIPID PANEL
Cholesterol: 198 mg/dL (ref 0–200)
HDL: 49 mg/dL (ref 35–70)
LDL CALC: 121 mg/dL
LDL/HDL RATIO: 4
Triglycerides: 142 mg/dL (ref 40–160)

## 2014-10-08 LAB — HEMOGLOBIN A1C: Hgb A1c MFr Bld: 6.8 % — AB (ref 4.0–6.0)

## 2014-10-30 ENCOUNTER — Other Ambulatory Visit: Payer: Self-pay | Admitting: *Deleted

## 2014-10-30 MED ORDER — ALPRAZOLAM 0.25 MG PO TABS: ORAL_TABLET | ORAL | Status: DC

## 2014-10-30 NOTE — Telephone Encounter (Signed)
[facility] 

## 2014-11-04 ENCOUNTER — Non-Acute Institutional Stay (SKILLED_NURSING_FACILITY): Payer: Medicare Other | Admitting: Nurse Practitioner

## 2014-11-04 DIAGNOSIS — E1165 Type 2 diabetes mellitus with hyperglycemia: Secondary | ICD-10-CM | POA: Diagnosis not present

## 2014-11-04 DIAGNOSIS — F411 Generalized anxiety disorder: Secondary | ICD-10-CM

## 2014-11-04 DIAGNOSIS — R634 Abnormal weight loss: Secondary | ICD-10-CM

## 2014-11-04 DIAGNOSIS — I4891 Unspecified atrial fibrillation: Secondary | ICD-10-CM | POA: Diagnosis not present

## 2014-11-04 DIAGNOSIS — I10 Essential (primary) hypertension: Secondary | ICD-10-CM

## 2014-11-04 DIAGNOSIS — IMO0002 Reserved for concepts with insufficient information to code with codable children: Secondary | ICD-10-CM

## 2014-11-04 DIAGNOSIS — F0391 Unspecified dementia with behavioral disturbance: Secondary | ICD-10-CM

## 2014-11-04 NOTE — Progress Notes (Signed)
Patient ID: [name], female   DOB: April 07, 1927, 81 y.o.   MRN: [id number]    Nursing Home Location:  [place] of Service: SNF (31)  PCP: [name], MD  Allergies  Allergen Reactions  . Penicillins     Chief Complaint  Patient presents with  . Medical Management of Chronic Issues    HPI:  Patient is a 81 y.o. female seen today at [facility] for medical management of chronic disease. Pt with pmh of DM, HTN, hyperlipidemia, depression, OP, dementia, hip fracture. Staff reports patient has been doing fair in the last month. Ongoing issues with anxiety and depression. No severe episodes of anxiety. Pt denies changes in mood, anxiety or depression but is noted to have sever dementia. Pt needing assistance with meals and eats very slowly. Denies pain, palpitation, shortness of breath, chest pains.  Review of Systems:  Review of Systems  Constitutional: Negative for activity change, appetite change, fatigue and unexpected weight change.  HENT: Negative for congestion and hearing loss.   Eyes: Negative.   Respiratory: Negative for cough and shortness of breath.   Cardiovascular: Negative for chest pain, palpitations and leg swelling.  Gastrointestinal: Negative for abdominal pain, diarrhea and constipation.  Genitourinary: Negative for dysuria and difficulty urinating.  Musculoskeletal: Negative for myalgias and arthralgias.  Skin: Negative for color change and wound.  Neurological: Negative for dizziness and weakness.  Psychiatric/Behavioral: Positive for confusion. Negative for behavioral problems and agitation.    Past Medical History  Diagnosis Date  . DM (diabetes mellitus)   . HTN (hypertension)   . Hypercholesterolemia   . Depression   . Breast cancer 1980    right  . Intrauterine pessary   . Wrist fracture 05/2011  . Osteoporosis   . Memory difficulty 05/15/2013  . Abnormality of gait 05/15/2013  . Dementia   .  TIA (transient ischemic attack)    Past Surgical History  Procedure Laterality Date  . Mastectomy modified radical Right     30 years ago  . Intramedullary (im) nail intertrochanteric Left 08/27/2014    Procedure: INTRAMEDULLARY (IM) NAIL INTERTROCHANTRIC;  Surgeon: Renette Butters, MD;  Location: [place];  Service: Orthopedics;  Laterality: Left;   Social History:   reports that she has never smoked. She has never used smokeless tobacco. She reports that she does not drink alcohol or use illicit drugs.  Family History  Problem Relation Age of Onset  . Diabetes Father   . Cancer Father     leukemia  . Heart attack Brother   . Dementia Maternal Aunt     Medications: Patient's Medications  New Prescriptions   No medications on file  Previous Medications   ALPRAZOLAM (XANAX) 0.25 MG TABLET    Take one tablet by mouth once daily as needed only for severe anxiety   APIXABAN (ELIQUIS) 2.5 MG TABS TABLET    Take 1 tablet (2.5 mg total) by mouth 2 (two) times daily.   DILTIAZEM (CARDIZEM CD) 120 MG 24 HR CAPSULE    Take 240 mg by mouth daily.    DOCUSATE SODIUM (COLACE) 100 MG CAPSULE    Take 1 capsule (100 mg total) by mouth 2 (two) times daily.   DONEPEZIL (ARICEPT) 10 MG TABLET    Take 1 tablet (10 mg total) by mouth at bedtime.   EZETIMIBE (ZETIA) 10 MG TABLET    Take 10 mg by mouth daily.   GLIMEPIRIDE (AMARYL) 2 MG  TABLET    Take 2 mg by mouth daily with breakfast.   HYDROCODONE-ACETAMINOPHEN (NORCO) 5-325 MG PER TABLET    Take 1-2 tablets by mouth every 6 (six) hours as needed for moderate pain.   INSULIN LISPRO (HUMALOG) 100 UNIT/ML INJECTION    Inject into the skin as directed.   MEMANTINE (NAMENDA XR) 28 MG CP24 24 HR CAPSULE    Take 1 capsule (28 mg total) by mouth daily.   ONDANSETRON (ZOFRAN) 4 MG TABLET    Take 1 tablet (4 mg total) by mouth every 8 (eight) hours as needed for nausea or vomiting.   OXYBUTYNIN (DITROPAN) 5 MG TABLET    Take 5 mg by mouth 2 (two) times daily.    PAROXETINE (PAXIL) 20 MG TABLET    Take 30 mg by mouth daily.   Modified Medications   No medications on file  Discontinued Medications   DILTIAZEM (CARDIZEM CD) 180 MG 24 HR CAPSULE    Take 180 mg by mouth daily.     Physical Exam: Filed Vitals:   11/04/14 1236  BP: 131/67  Pulse: 87  Temp: 98.7 F (37.1 C)  Resp: 20  Weight: 118 lb 12.8 oz (53.887 kg)    Physical Exam  Constitutional: She is oriented to person, place, and time. No distress.  HENT:  Head: Normocephalic and atraumatic.  Mouth/Throat: Oropharynx is clear and moist. No oropharyngeal exudate.  Eyes: Conjunctivae are normal. Pupils are equal, round, and reactive to light.  Neck: Normal range of motion. Neck supple.  Cardiovascular: Normal rate and normal heart sounds.  An irregular rhythm present.  Pulmonary/Chest: Effort normal and breath sounds normal.  Abdominal: Soft. Bowel sounds are normal.  Musculoskeletal: She exhibits no edema or tenderness.  Neurological: She is alert and oriented to person, place, and time.  Skin: Skin is warm and dry. She is not diaphoretic.  Psychiatric: She has a normal mood and affect.    Labs reviewed: Basic Metabolic Panel:  Recent Labs  08/27/14 0214 08/28/14 0241 09/15/14 1504  NA 138 140 139  K 3.8 3.7 4.1  CL 106 105 104  CO2 25 27 23   GLUCOSE 116* 130* 188*  BUN 18 22* 17  CREATININE 0.61 0.67 0.72  CALCIUM 8.5* 8.4* 9.3   Liver Function Tests:  Recent Labs  08/25/14 1537  ALBUMIN 3.5   No results for input(s): LIPASE, AMYLASE in the last 8760 hours. No results for input(s): AMMONIA in the last 8760 hours. CBC:  Recent Labs  08/25/14 0937  08/28/14 0241 08/29/14 0900 08/29/14 1928 09/15/14 1504  WBC 13.8*  < > 9.6 8.1  --  10.4  NEUTROABS 8.2*  --   --   --   --   --   HGB 12.5  < > 8.5* 7.9* 8.5* 12.4  HCT 36.4  < > 25.2* 23.6* 25.6* 38.5  MCV 95.3  < > 95.1 95.5  --  98.2  PLT 239  < > 221 238  --  499*  < > = values in this interval not  displayed. TSH:  Recent Labs  08/25/14 1307 08/25/14 1537  TSH 0.545 0.457   A1C: Lab Results  Component Value Date   HGBA1C 6.6* 08/25/2014   Lipid Panel:  Recent Labs  08/26/14 0225  CHOL 180  HDL 46  LDLCALC 113*  TRIG 107  CHOLHDL 3.9   10/08/14 cholesterol 198, hdl 49, LDL 121, triglycerides 142,  hgb A1c 6.8  Assessment/Plan  1. Atrial fibrillation  with RVR (new onset) Rate controlled on cardizem. conts apixaban for anticoagulation.   2. Essential hypertension Blood pressure controlled on current medications.   3. Dementia, with behavioral disturbance MMSE 6/30 in August when seen by the neurologist which was significantly worse than prior MMSE, conts on aricept and namenda at this time, needing increased assistance by family and staff for ADLs   4. Anxiety state conts on paxil 30 mg daily, staff reports ongoing anxiety and depression which is her major problem. Will increase paxil to 40 mg daily at this time  5. Loss of weight -ongoing, needing assistance to eat -will get RD consult at this time  6. Diabetes mellitus type II, uncontrolled Blood sugars have been stable, A1c at goal in August -to cont Amaryl and Humalog     Jessica K. Harle Battiest  [facility] [phone] 8 am - 5 pm) [phone] (after hours)

## 2014-11-27 ENCOUNTER — Encounter: Payer: Self-pay | Admitting: Internal Medicine

## 2014-11-27 NOTE — Progress Notes (Deleted)
Patient ID: Brittney Powell, female   DOB: [birth year], 81 y.o.   MRN: [id number]  This encounter was created in error - please disregard.

## 2014-12-01 ENCOUNTER — Other Ambulatory Visit: Payer: Self-pay | Admitting: *Deleted

## 2014-12-01 MED ORDER — HYDROCODONE-ACETAMINOPHEN 5-325 MG PO TABS: ORAL_TABLET | ORAL | Status: DC

## 2014-12-01 NOTE — Telephone Encounter (Signed)
[facility] 

## 2014-12-09 ENCOUNTER — Non-Acute Institutional Stay (SKILLED_NURSING_FACILITY): Payer: Medicare Other | Admitting: Nurse Practitioner

## 2014-12-09 DIAGNOSIS — R634 Abnormal weight loss: Secondary | ICD-10-CM | POA: Diagnosis not present

## 2014-12-09 DIAGNOSIS — F0391 Unspecified dementia with behavioral disturbance: Secondary | ICD-10-CM

## 2014-12-09 DIAGNOSIS — F411 Generalized anxiety disorder: Secondary | ICD-10-CM

## 2014-12-09 DIAGNOSIS — I4821 Permanent atrial fibrillation: Secondary | ICD-10-CM

## 2014-12-09 DIAGNOSIS — I10 Essential (primary) hypertension: Secondary | ICD-10-CM | POA: Diagnosis not present

## 2014-12-09 DIAGNOSIS — I482 Chronic atrial fibrillation: Secondary | ICD-10-CM | POA: Diagnosis not present

## 2014-12-09 NOTE — Progress Notes (Signed)
Patient ID: [name], female   DOB: [birth year], 81 y.o.   MRN: [id number]    Nursing Home Location:  [place] of Service: SNF (31)  PCP: [name], MD  Allergies  Allergen Reactions  . Penicillins     Chief Complaint  Patient presents with  . Medical Management of Chronic Issues    HPI:  Patient is a 81 y.o. female seen today at [facility] for medical management of her chronic issues. She has a past medical history of DM, HTN, Breast CA, Depression, and osteoporosis.  Today she is seen in her room and is tearful and says she would like to go home.  She expresses sadness that she is "living here".  She does have family and says they come to see her quite often.  Nursing without any acute concerns.  She says she is eating well and is in no pain.   Review of Systems:  Review of Systems  Constitutional: Negative for fever, chills and fatigue.  HENT: Negative for congestion, rhinorrhea and sore throat.   Respiratory: Negative for cough, shortness of breath and wheezing.   Cardiovascular: Negative for chest pain, palpitations and leg swelling.  Gastrointestinal: Negative for abdominal pain, diarrhea and constipation.  Genitourinary: Negative for dysuria, hematuria and flank pain.  Musculoskeletal: Negative for back pain, joint swelling and arthralgias.  Skin: Negative for color change, pallor, rash and wound.  Neurological: Negative for dizziness, facial asymmetry and light-headedness.  Psychiatric/Behavioral: Negative for confusion and agitation. The patient is nervous/anxious.     Past Medical History  Diagnosis Date  . DM (diabetes mellitus)   . HTN (hypertension)   . Hypercholesterolemia   . Depression   . Breast cancer 1980    right  . Intrauterine pessary   . Wrist fracture 05/2011  . Osteoporosis   . Memory difficulty 05/15/2013  . Abnormality of gait 05/15/2013  . Dementia   . TIA (transient ischemic attack)      Past Surgical History  Procedure Laterality Date  . Mastectomy modified radical Right     30 years ago  . Intramedullary (im) nail intertrochanteric Left 08/27/2014    Procedure: INTRAMEDULLARY (IM) NAIL INTERTROCHANTRIC;  Surgeon: Renette Butters, MD;  Location: [place];  Service: Orthopedics;  Laterality: Left;   Social History:   reports that she has never smoked. She has never used smokeless tobacco. She reports that she does not drink alcohol or use illicit drugs.  Family History  Problem Relation Age of Onset  . Diabetes Father   . Cancer Father     leukemia  . Heart attack Brother   . Dementia Maternal Aunt     Medications: Patient's Medications  New Prescriptions   No medications on file  Previous Medications   ALPRAZOLAM (XANAX) 0.25 MG TABLET    Take one tablet by mouth once daily as needed only for severe anxiety   APIXABAN (ELIQUIS) 2.5 MG TABS TABLET    Take 1 tablet (2.5 mg total) by mouth 2 (two) times daily.   DILTIAZEM (CARDIZEM CD) 120 MG 24 HR CAPSULE    Take 240 mg by mouth daily.    DOCUSATE SODIUM (COLACE) 100 MG CAPSULE    Take 1 capsule (100 mg total) by mouth 2 (two) times daily.   DONEPEZIL (ARICEPT) 10 MG TABLET    Take 1 tablet (10 mg total) by mouth at bedtime.   EZETIMIBE (ZETIA) 10 MG TABLET  Take 10 mg by mouth daily.   GLIMEPIRIDE (AMARYL) 2 MG TABLET    Take 2 mg by mouth daily with breakfast.   HYDROCODONE-ACETAMINOPHEN (NORCO) 5-325 MG TABLET    Take one tablet by mouth every 6 hours as needed for moderate pain. Do not exceed 4gm of Tylenol in 24hrs   INSULIN LISPRO (HUMALOG) 100 UNIT/ML INJECTION    Inject into the skin as directed.   MEMANTINE (NAMENDA XR) 28 MG CP24 24 HR CAPSULE    Take 1 capsule (28 mg total) by mouth daily.   ONDANSETRON (ZOFRAN) 4 MG TABLET    Take 1 tablet (4 mg total) by mouth every 8 (eight) hours as needed for nausea or vomiting.   OXYBUTYNIN (DITROPAN) 5 MG TABLET    Take 5 mg by mouth 2 (two) times daily.    PAROXETINE (PAXIL) 20 MG TABLET    Take 30 mg by mouth daily.   Modified Medications   No medications on file  Discontinued Medications   No medications on file     Physical Exam: Filed Vitals:   12/09/14 1015  BP: 117/75  Pulse: 69  Temp: 98.2 F (36.8 C)  Resp: 20  Weight: 117 lb (53.071 kg)    Physical Exam  Constitutional: She appears well-developed and well-nourished.  HENT:  Head: Normocephalic and atraumatic.  Right Ear: External ear normal.  Left Ear: External ear normal.  Mouth/Throat: Oropharynx is clear and moist.  Eyes: Pupils are equal, round, and reactive to light.  Neck: Normal range of motion. Neck supple. No JVD present.  Cardiovascular: Normal rate, regular rhythm, normal heart sounds and intact distal pulses.   No murmur heard. Pulmonary/Chest: Effort normal and breath sounds normal.  Abdominal: Soft. Bowel sounds are normal. She exhibits no distension. There is no tenderness.  Musculoskeletal: Normal range of motion. She exhibits no edema or tenderness.  Lymphadenopathy:    She has no cervical adenopathy.  Neurological: She is alert. She is disoriented (to situation, time. Oriented to self only).  Skin: Skin is warm and dry.  Psychiatric: She does not exhibit a depressed mood.  Tearful    Labs reviewed: Basic Metabolic Panel:  Recent Labs  08/27/14 0214 08/28/14 0241 09/03/14 1210 09/15/14 1504  NA 138 140 141 139  K 3.8 3.7 4.0 4.1  CL 106 105  --  104  CO2 25 27  --  23  GLUCOSE 116* 130*  --  188*  BUN 18 22* 27* 17  CREATININE 0.61 0.67 0.4* 0.72  CALCIUM 8.5* 8.4*  --  9.3   Liver Function Tests:  Recent Labs  08/25/14 1537 10/08/14 1210  AST  --  14  ALT  --  11  ALKPHOS  --  143*  ALBUMIN 3.5  --    No results for input(s): LIPASE, AMYLASE in the last 8760 hours. No results for input(s): AMMONIA in the last 8760 hours. CBC:  Recent Labs  08/25/14 0937  08/28/14 0241 08/29/14 0900 08/29/14 1928 09/10/14 0952  09/10/14 1210 09/15/14 1504  WBC 13.8*  < > 9.6 8.1  --  8.8  --  10.4  NEUTROABS 8.2*  --   --   --   --   --   --   --   HGB 12.5  < > 8.5* 7.9* 8.5*  --  10.6* 12.4  HCT 36.4  < > 25.2* 23.6* 25.6*  --  32* 38.5  MCV 95.3  < > 95.1 95.5  --   --   --  98.2  PLT 239  < > 221 238  --   --  509* 499*  < > = values in this interval not displayed. TSH:  Recent Labs  08/25/14 1307 08/25/14 1537  TSH 0.545 0.457   A1C: Lab Results  Component Value Date   HGBA1C 6.8* 10/08/2014   Lipid Panel:  Recent Labs  08/26/14 0225 10/08/14 1210  CHOL 180 198  HDL 46 49  LDLCALC 113* 121  TRIG 107 142  CHOLHDL 3.9  --      Assessment/Plan 1. Atrial fibrillation Stable. Rate controlled on diltiazem. On apixaban for anticoagulation.  Followed by cardiology, next appointment to be in January 2017.    2. Essential hypertension Stable. Blood pressures within desirable range. Renal function is stable, not on any HTN meds at this time.    3. Dementia, with behavioral disturbance Stable.  Patient is oriented to self only.  She is able to mobilize with the help of staff and four wheel walker.  Continue Namenda and Aricept.   4. Anxiety state Unchanged.  Patient is tearful, expresses sadness and anxiety about living situation. Paxil is at max dose. Will add Remeron 15 mg due to weight loss and low mood.   5. Loss of weight Patient continues to lose weight, has lost one pound in past month.  Add Med pass BID and have RD to follow. Will also add remeron 15 mg qhs to help with appetite    Gadiel John K. Harle Battiest  [facility] [phone] 8 am - 5 pm) [phone] (after hours)

## 2014-12-14 ENCOUNTER — Other Ambulatory Visit: Payer: Self-pay | Admitting: *Deleted

## 2014-12-14 MED ORDER — ALPRAZOLAM 0.25 MG PO TABS: ORAL_TABLET | ORAL | Status: DC

## 2014-12-14 NOTE — Telephone Encounter (Signed)
[facility] 

## 2015-01-11 ENCOUNTER — Encounter: Payer: Self-pay | Admitting: Nurse Practitioner

## 2015-01-11 ENCOUNTER — Non-Acute Institutional Stay (SKILLED_NURSING_FACILITY): Payer: Medicare Other | Admitting: Nurse Practitioner

## 2015-01-11 DIAGNOSIS — F0391 Unspecified dementia with behavioral disturbance: Secondary | ICD-10-CM | POA: Diagnosis not present

## 2015-01-11 DIAGNOSIS — R634 Abnormal weight loss: Secondary | ICD-10-CM

## 2015-01-11 DIAGNOSIS — I10 Essential (primary) hypertension: Secondary | ICD-10-CM | POA: Diagnosis not present

## 2015-01-11 DIAGNOSIS — F411 Generalized anxiety disorder: Secondary | ICD-10-CM | POA: Diagnosis not present

## 2015-01-11 DIAGNOSIS — IMO0001 Reserved for inherently not codable concepts without codable children: Secondary | ICD-10-CM

## 2015-01-11 DIAGNOSIS — E1165 Type 2 diabetes mellitus with hyperglycemia: Secondary | ICD-10-CM | POA: Diagnosis not present

## 2015-01-11 DIAGNOSIS — I482 Chronic atrial fibrillation, unspecified: Secondary | ICD-10-CM

## 2015-01-11 LAB — CBC AND DIFFERENTIAL
HCT: 39 % (ref 36–46)
Hemoglobin: 13.1 g/dL (ref 12.0–16.0)
Platelets: 366 K/µL (ref 150–399)
WBC: 10.5 10*3/mL

## 2015-01-11 LAB — HEPATIC FUNCTION PANEL
ALT: 15 U/L (ref 7–35)
AST: 14 U/L (ref 13–35)
Alkaline Phosphatase: 145 U/L — AB (ref 25–125)

## 2015-01-11 LAB — BASIC METABOLIC PANEL WITH GFR
BUN: 20 mg/dL (ref 4–21)
Creatinine: 0.6 mg/dL (ref <=1.1)
Potassium: 4.3 mmol/L (ref 3.4–5.3)
Sodium: 141 mmol/L (ref 137–147)

## 2015-01-11 NOTE — Progress Notes (Signed)
Patient ID: Brittney Powell, female   DOB: [birth year], 81 y.o.   MRN: [id number]    Nursing Home Location:  [place] of Service: SNF (31)  PCP: [name], MD  Allergies  Allergen Reactions  . Penicillins     Chief Complaint  Patient presents with  . Medical Management of Chronic Issues    Routine Visit     HPI:  Patient is a 81 y.o. female seen today at [facility] for medical management of her chronic issues. She has a past medical history of DM, HTN, Breast CA, Depression, and osteoporosis. pt reports she is doing well however due to dementia pt is a poor historian. Pt without complaints. Staff reports anxiety has improved. No constipation or pain noted. Pt without acute issues in the last month.   Review of Systems:  Review of Systems  Unable to perform ROS: Dementia    Past Medical History  Diagnosis Date  . DM (diabetes mellitus) ([place])   . HTN (hypertension)   . Hypercholesterolemia   . Depression   . Breast cancer ([place]) 1980    right  . Intrauterine pessary   . Wrist fracture 05/2011  . Osteoporosis   . Memory difficulty 05/15/2013  . Abnormality of gait 05/15/2013  . Dementia   . TIA (transient ischemic attack)    Past Surgical History  Procedure Laterality Date  . Mastectomy modified radical Right     30 years ago  . Intramedullary (im) nail intertrochanteric Left 08/27/2014    Procedure: INTRAMEDULLARY (IM) NAIL INTERTROCHANTRIC;  Surgeon: Renette Butters, MD;  Location: [place];  Service: Orthopedics;  Laterality: Left;   Social History:   reports that she has never smoked. She has never used smokeless tobacco. She reports that she does not drink alcohol or use illicit drugs.  Family History  Problem Relation Age of Onset  . Diabetes Father   . Cancer Father     leukemia  . Heart attack Brother   . Dementia Maternal Aunt     Medications: Patient's Medications  New Prescriptions   No medications on  file  Previous Medications   ALPRAZOLAM (XANAX) 0.25 MG TABLET    Take one tablet by mouth once daily for anxiety   APIXABAN (ELIQUIS) 2.5 MG TABS TABLET    Take 1 tablet (2.5 mg total) by mouth 2 (two) times daily.   DILTIAZEM (DILACOR XR) 240 MG 24 HR CAPSULE    Take 240 mg by mouth daily. For Afib   DOCUSATE SODIUM (COLACE) 100 MG CAPSULE    Take 1 capsule (100 mg total) by mouth 2 (two) times daily.   DONEPEZIL (ARICEPT) 10 MG TABLET    Take 1 tablet (10 mg total) by mouth at bedtime.   EZETIMIBE (ZETIA) 10 MG TABLET    Take 10 mg by mouth daily.   GLIMEPIRIDE (AMARYL) 2 MG TABLET    Take 2 mg by mouth daily with breakfast.   HYDROCODONE-ACETAMINOPHEN (NORCO/VICODIN) 5-325 MG TABLET    1 by mouth every 6 hours as need for moderate pain, 2 by mouth as needed for severe pain DO NOT EXCEED 4GM OF APAP IN 24 HOURS FROM ALL SOURCES   INSULIN LISPRO (HUMALOG) 100 UNIT/ML INJECTION    Sliding scale dosing per CBG results BID 201-250=3 units, 251-350= 5 units, >351=call MD   MEMANTINE (NAMENDA XR) 28 MG CP24 24 HR CAPSULE    Take 1 capsule (28 mg total) by  mouth daily.   MIRTAZAPINE (REMERON) 15 MG TABLET    Take 15 mg by mouth at bedtime.   ONDANSETRON (ZOFRAN) 4 MG TABLET    Take 1 tablet (4 mg total) by mouth every 8 (eight) hours as needed for nausea or vomiting.   OXYBUTYNIN (DITROPAN) 5 MG TABLET    Take 5 mg by mouth 2 (two) times daily.   PAROXETINE (PAXIL) 20 MG TABLET    Take 40 mg by mouth daily.    POLYETHYLENE GLYCOL (MIRALAX / GLYCOLAX) PACKET    Take 17 g by mouth daily.   SENNA-DOCUSATE (SENNA PLUS) 8.6-50 MG TABLET    Take 1 tablet by mouth 2 (two) times daily.  Modified Medications   No medications on file  Discontinued Medications   DILTIAZEM (CARDIZEM CD) 120 MG 24 HR CAPSULE    Take 240 mg by mouth daily.    HYDROCODONE-ACETAMINOPHEN (NORCO) 5-325 MG TABLET    Take one tablet by mouth every 6 hours as needed for moderate pain. Do not exceed 4gm of Tylenol in 24hrs      Physical Exam: Filed Vitals:   01/11/15 1513  BP: 112/70  Pulse: 71  Temp: 97.8 F (36.6 C)  TempSrc: Oral  Resp: 20  Height: 5\' 5"  (1.651 m)  Weight: 117 lb (53.071 kg)  SpO2: 97%    Physical Exam  Constitutional:  Thin frail female   HENT:  Head: Normocephalic and atraumatic.  Mouth/Throat: Oropharynx is clear and moist.  Eyes: Pupils are equal, round, and reactive to light.  Neck: Normal range of motion. Neck supple.  Cardiovascular: Normal rate, regular rhythm and normal heart sounds.   Pulmonary/Chest: Effort normal and breath sounds normal.  Abdominal: Soft. Bowel sounds are normal.  Musculoskeletal: She exhibits no edema or tenderness.  Neurological: She is alert. She is disoriented (to situation, time. Oriented to self only).  Skin: Skin is warm and dry.  Psychiatric:  Memory loss noted    Labs reviewed: Basic Metabolic Panel:  Recent Labs  08/27/14 0214 08/28/14 0241 09/03/14 1210 09/15/14 1504  NA 138 140 141 139  K 3.8 3.7 4.0 4.1  CL 106 105  --  104  CO2 25 27  --  23  GLUCOSE 116* 130*  --  188*  BUN 18 22* 27* 17  CREATININE 0.61 0.67 0.4* 0.72  CALCIUM 8.5* 8.4*  --  9.3   Liver Function Tests:  Recent Labs  08/25/14 1537 10/08/14 1210  AST  --  14  ALT  --  11  ALKPHOS  --  143*  ALBUMIN 3.5  --    No results for input(s): LIPASE, AMYLASE in the last 8760 hours. No results for input(s): AMMONIA in the last 8760 hours. CBC:  Recent Labs  08/25/14 0937  08/28/14 0241 08/29/14 0900 08/29/14 1928 09/10/14 0952 09/10/14 1210 09/15/14 1504  WBC 13.8*  < > 9.6 8.1  --  8.8  --  10.4  NEUTROABS 8.2*  --   --   --   --   --   --   --   HGB 12.5  < > 8.5* 7.9* 8.5*  --  10.6* 12.4  HCT 36.4  < > 25.2* 23.6* 25.6*  --  32* 38.5  MCV 95.3  < > 95.1 95.5  --   --   --  98.2  PLT 239  < > 221 238  --   --  509* 499*  < > = values in this  interval not displayed. TSH:  Recent Labs  08/25/14 1307 08/25/14 1537  TSH 0.545  0.457   A1C: Lab Results  Component Value Date   HGBA1C 6.8* 10/08/2014   Lipid Panel:  Recent Labs  08/26/14 0225 10/08/14 1210  CHOL 180 198  HDL 46 49  LDLCALC 113* 121  TRIG 107 142  CHOLHDL 3.9  --      Assessment/Plan 1. Loss of weight Ongoing weight loss despite supplement, remeron and nursing support. RD following pt. Expect further weight loss as dementia progresses.   2. Dementia, with behavioral disturbance Advanced dementia, ongoing decline anticipated due to worsening diease state. conts on aricept and namenda daily  3. Uncontrolled type 2 diabetes mellitus without complication, without long-term current use of insulin (HCC) -a1c stable in aug at 6.8, will cont current regimen at this time  4. Essential hypertension Blood pressure remains stable on diltiazem  5. Chronic atrial fibrillation (HCC) Rate controlled on diltiazem, conts on eliquis 2.5 mg BID, will follow up cbc  6. Anxiety -has remains stable at this time. conts on paxil and xanax   Jessica K. Harle Battiest  [facility] [phone] 8 am - 5 pm) [phone] (after hours)

## 2015-01-17 DIAGNOSIS — I4891 Unspecified atrial fibrillation: Secondary | ICD-10-CM | POA: Insufficient documentation

## 2015-01-21 ENCOUNTER — Encounter: Payer: Self-pay | Admitting: Podiatry

## 2015-01-21 ENCOUNTER — Ambulatory Visit (INDEPENDENT_AMBULATORY_CARE_PROVIDER_SITE_OTHER): Payer: Medicare Other | Admitting: Podiatry

## 2015-01-21 VITALS — BP 145/94 | HR 78 | Resp 16 | Ht 66.0 in | Wt 115.0 lb

## 2015-01-21 DIAGNOSIS — M79604 Pain in right leg: Secondary | ICD-10-CM | POA: Diagnosis not present

## 2015-01-21 DIAGNOSIS — L600 Ingrowing nail: Secondary | ICD-10-CM

## 2015-01-21 DIAGNOSIS — M79605 Pain in left leg: Secondary | ICD-10-CM

## 2015-01-21 DIAGNOSIS — L03012 Cellulitis of left finger: Secondary | ICD-10-CM

## 2015-01-21 DIAGNOSIS — B351 Tinea unguium: Secondary | ICD-10-CM

## 2015-01-21 NOTE — Patient Instructions (Signed)

## 2015-01-21 NOTE — Progress Notes (Signed)
   Subjective:    Patient ID: Brittney Powell, female    DOB: [birth year], 81 y.o.   MRN: [id number]  HPI Patient presents with bilateral debridement.  Patient also presents with a nail problem on their Left foot, great toe; nail is loose & bleeding; x2 week.  Patient also needs diabetic foot check. Pt diabetic type 2; sugar=was not taken today.   Review of Systems  Cardiovascular: Positive for palpitations.  Neurological: Positive for weakness.  Psychiatric/Behavioral: Positive for confusion. The patient is nervous/anxious.   All other systems reviewed and are negative.      Objective:   Physical Exam        Assessment & Plan:

## 2015-01-22 ENCOUNTER — Non-Acute Institutional Stay (SKILLED_NURSING_FACILITY): Payer: Medicare Other | Admitting: Nurse Practitioner

## 2015-01-22 ENCOUNTER — Encounter: Payer: Self-pay | Admitting: Nurse Practitioner

## 2015-01-22 DIAGNOSIS — F0391 Unspecified dementia with behavioral disturbance: Secondary | ICD-10-CM | POA: Diagnosis not present

## 2015-01-22 DIAGNOSIS — R296 Repeated falls: Secondary | ICD-10-CM

## 2015-01-22 DIAGNOSIS — S91209D Unspecified open wound of unspecified toe(s) with damage to nail, subsequent encounter: Secondary | ICD-10-CM

## 2015-01-22 DIAGNOSIS — F411 Generalized anxiety disorder: Secondary | ICD-10-CM | POA: Diagnosis not present

## 2015-01-22 NOTE — Progress Notes (Signed)
Nursing Home Location:  [place] of Service: SNF (6)  PCP: [name], MD  Allergies  Allergen Reactions  . Penicillins     Chief Complaint  Patient presents with  . Acute Visit    increased confusion, recent fall    HPI:  Patient is a 81 y.o. female seen today at [facility] at the request of nursing due to increase confusion. Pt also with recent fall. She has a past medical history of DM, HTN, Breast CA, Depression, and osteoporosis. Pt seen with husband. Pt with chronic memory loss and confusion. Husband feels like confusion is at baseline with gradual worsening of her memory. No acute confusion or delirium noted. Pt also has had a few falls, most recent fall a few days ago without injury. Xray obtained of right knee which was negative for acute fracture. Pt does not display signs of pain. Husband reported pt went to podiatrist today and had toenail removed. Wants to make sure dressing and toe will be followed by nursing.  Review of Systems:  Review of Systems  Unable to perform ROS: Dementia    Past Medical History  Diagnosis Date  . DM (diabetes mellitus) ([place])   . HTN (hypertension)   . Hypercholesterolemia   . Depression   . Breast cancer ([place]) 1980    right  . Intrauterine pessary   . Wrist fracture 05/2011  . Osteoporosis   . Memory difficulty 05/15/2013  . Abnormality of gait 05/15/2013  . Dementia   . TIA (transient ischemic attack)    Past Surgical History  Procedure Laterality Date  . Mastectomy modified radical Right     30 years ago  . Intramedullary (im) nail intertrochanteric Left 08/27/2014    Procedure: INTRAMEDULLARY (IM) NAIL INTERTROCHANTRIC;  Surgeon: Renette Butters, MD;  Location: [place];  Service: Orthopedics;  Laterality: Left;   Social History:   reports that she has never smoked. She has never used smokeless tobacco. She reports that she does not drink alcohol or use illicit  drugs.  Family History  Problem Relation Age of Onset  . Diabetes Father   . Cancer Father     leukemia  . Heart attack Brother   . Dementia Maternal Aunt     Medications: Patient's Medications  New Prescriptions   No medications on file  Previous Medications   ALPRAZOLAM (XANAX) 0.25 MG TABLET    Take one tablet by mouth once daily for anxiety   APIXABAN (ELIQUIS) 2.5 MG TABS TABLET    Take 1 tablet (2.5 mg total) by mouth 2 (two) times daily.   DILTIAZEM (DILACOR XR) 240 MG 24 HR CAPSULE    Take 240 mg by mouth daily. For Afib   DOCUSATE SODIUM (COLACE) 100 MG CAPSULE    Take 1 capsule (100 mg total) by mouth 2 (two) times daily.   DONEPEZIL (ARICEPT) 10 MG TABLET    Take 1 tablet (10 mg total) by mouth at bedtime.   EZETIMIBE (ZETIA) 10 MG TABLET    Take 10 mg by mouth daily.   GLIMEPIRIDE (AMARYL) 2 MG TABLET    Take 2 mg by mouth daily with breakfast.   HYDROCODONE-ACETAMINOPHEN (NORCO/VICODIN) 5-325 MG TABLET    1 by mouth every 6 hours as need for moderate pain, 2 by mouth as needed for severe pain DO NOT EXCEED 4GM OF APAP IN 24 HOURS FROM ALL SOURCES   INSULIN LISPRO (HUMALOG) 100 UNIT/ML  INJECTION    Sliding scale dosing per CBG results BID 201-250=3 units, 251-350= 5 units, >351=call MD   MEMANTINE (NAMENDA XR) 28 MG CP24 24 HR CAPSULE    Take 1 capsule (28 mg total) by mouth daily.   MIRTAZAPINE (REMERON) 15 MG TABLET    Take 15 mg by mouth at bedtime.   ONDANSETRON (ZOFRAN) 4 MG TABLET    Take 1 tablet (4 mg total) by mouth every 8 (eight) hours as needed for nausea or vomiting.   OXYBUTYNIN (DITROPAN) 5 MG TABLET    Take 5 mg by mouth 2 (two) times daily.   PAROXETINE (PAXIL) 20 MG TABLET    Take 40 mg by mouth daily.    POLYETHYLENE GLYCOL (MIRALAX / GLYCOLAX) PACKET    Take 17 g by mouth daily.   SENNA-DOCUSATE (SENNA PLUS) 8.6-50 MG TABLET    Take 1 tablet by mouth 2 (two) times daily.  Modified Medications   No medications on file  Discontinued Medications   No  medications on file     Physical Exam: Filed Vitals:   01/22/15 1229  BP: 138/91  Pulse: 85  Temp: 96.7 F (35.9 C)  Resp: 20  Height: 5\' 6"  (1.676 m)  Weight: 115 lb (52.164 kg)    Physical Exam  Constitutional:  Thin frail female   HENT:  Head: Normocephalic and atraumatic.  Mouth/Throat: Oropharynx is clear and moist.  Eyes: Pupils are equal, round, and reactive to light.  Neck: Normal range of motion. Neck supple.  Cardiovascular: Normal rate, regular rhythm and normal heart sounds.   Pulmonary/Chest: Effort normal and breath sounds normal.  Abdominal: Soft. Bowel sounds are normal.  Musculoskeletal: She exhibits no edema or tenderness.  Neurological: She is alert. She is disoriented (to situation, time. Oriented to self only).  Skin: Skin is warm and dry.  Psychiatric:  Memory loss noted    Labs reviewed: Basic Metabolic Panel:  Recent Labs  08/27/14 0214 08/28/14 0241 09/03/14 1210 09/15/14 1504 01/11/15  NA 138 140 141 139 141  K 3.8 3.7 4.0 4.1 4.3  CL 106 105  --  104  --   CO2 25 27  --  23  --   GLUCOSE 116* 130*  --  188*  --   BUN 18 22* 27* 17 20  CREATININE 0.61 0.67 0.4* 0.72 0.6  CALCIUM 8.5* 8.4*  --  9.3  --    Liver Function Tests:  Recent Labs  08/25/14 1537 10/08/14 1210 01/11/15  AST  --  14 14  ALT  --  11 15  ALKPHOS  --  143* 145*  ALBUMIN 3.5  --   --    No results for input(s): LIPASE, AMYLASE in the last 8760 hours. No results for input(s): AMMONIA in the last 8760 hours. CBC:  Recent Labs  08/25/14 0937  08/28/14 0241 08/29/14 0900  09/10/14 0952 09/10/14 1210 09/15/14 1504 01/11/15  WBC 13.8*  < > 9.6 8.1  --  8.8  --  10.4 10.5  NEUTROABS 8.2*  --   --   --   --   --   --   --   --   HGB 12.5  < > 8.5* 7.9*  < >  --  10.6* 12.4 13.1  HCT 36.4  < > 25.2* 23.6*  < >  --  32* 38.5 39  MCV 95.3  < > 95.1 95.5  --   --   --  98.2  --  PLT 239  < > 221 238  --   --  509* 499* 366  < > = values in this interval  not displayed. TSH:  Recent Labs  08/25/14 1307 08/25/14 1537  TSH 0.545 0.457   A1C: Lab Results  Component Value Date   HGBA1C 6.8* 10/08/2014   Lipid Panel:  Recent Labs  08/26/14 0225 10/08/14 1210  CHOL 180 198  HDL 46 49  LDLCALC 113* 121  TRIG 107 142  CHOLHDL 3.9  --       Assessment/Plan 1. Dementia, with behavioral disturbance Progressive decline noted by staff and husband, there has been no acute changes in mood, cognition or behaviors.  -conts on aricept and namenda daily  2. Anxiety state -stable at this time, husband does not feel like there has been much improvement but not worse. Followed by psych services   3. Frequent falls -fall precautions in place. Recent fall in day room, no injury noted  4. Toenail avulsion, subsequent encounter Removed via podiatry, dressing in place and care discussed with wound care nurse - to closely monitor for signs of infection     Jessica K. Harle Battiest  [facility] [phone] 8 am - 5 pm) [phone] (after hours)

## 2015-01-22 NOTE — Progress Notes (Signed)
Subjective:     Patient ID: [name], female   DOB: 09-Jan-1928, 81 y.o.   MRN: [id number]  HPI patient presents with caregiver with severe nail disease 1-5 both feet with traumatic left hallux nail that is loose and draining with no history of what may have happened   Review of Systems  All other systems reviewed and are negative.      Objective:   Physical Exam  Constitutional: She is oriented to person, place, and time.  Cardiovascular: Intact distal pulses.   Musculoskeletal: Normal range of motion.  Neurological: She is oriented to person, place, and time.  Skin: Skin is warm and dry.  Nursing note and vitals reviewed.  neurovascular status found to be diminished with sharp [place] vibratory diminished. Patient's found to have thin type skin and is in poor health and in a wheelchair with severely mala treated nail 1-5 both feet that are E long gaited and the left hallux nail is completely lucid with severe thickness drainage and localized edema. No proximal edema erythema or drainage was noted     Assessment:     Severe trauma to the left hallux nail with mala treated nailbeds 1-5 both feet    Plan:     H&P and education to caregiver rendered. Today I infiltrated the left hallux 60 mg Xylocaine Marcaine mixture remove the hallux nail flushed out the bed and reduced all necrotic tissue. Applied sterile dressing instructed on soaks and debrided all remaining nailbeds today with no iatrogenic bleeding noted. Patient will be seen back for reevaluation and will require long-term nail care

## 2015-02-07 NOTE — Progress Notes (Signed)
This encounter was created in error - please disregard.

## 2015-02-23 LAB — CBC AND DIFFERENTIAL
HCT: 36 % (ref 36–46)
Hemoglobin: 11.8 g/dL — AB (ref 12.0–16.0)
PLATELETS: 304 10*3/uL (ref 150–399)
WBC: 8.3 10*3/mL

## 2015-02-23 LAB — BASIC METABOLIC PANEL
BUN: 20 mg/dL (ref 4–21)
CREATININE: 0.6 mg/dL (ref 0.5–1.1)
GLUCOSE: 196 mg/dL
Potassium: 4.1 mmol/L (ref 3.4–5.3)
Sodium: 141 mmol/L (ref 137–147)

## 2015-03-03 ENCOUNTER — Non-Acute Institutional Stay (SKILLED_NURSING_FACILITY): Payer: Medicare Other | Admitting: Nurse Practitioner

## 2015-03-03 DIAGNOSIS — F411 Generalized anxiety disorder: Secondary | ICD-10-CM | POA: Diagnosis not present

## 2015-03-03 DIAGNOSIS — R296 Repeated falls: Secondary | ICD-10-CM

## 2015-03-03 DIAGNOSIS — F0391 Unspecified dementia with behavioral disturbance: Secondary | ICD-10-CM | POA: Diagnosis not present

## 2015-03-03 DIAGNOSIS — IMO0001 Reserved for inherently not codable concepts without codable children: Secondary | ICD-10-CM

## 2015-03-03 DIAGNOSIS — R634 Abnormal weight loss: Secondary | ICD-10-CM

## 2015-03-03 DIAGNOSIS — I482 Chronic atrial fibrillation, unspecified: Secondary | ICD-10-CM

## 2015-03-03 DIAGNOSIS — E1165 Type 2 diabetes mellitus with hyperglycemia: Secondary | ICD-10-CM

## 2015-03-03 DIAGNOSIS — R32 Unspecified urinary incontinence: Secondary | ICD-10-CM

## 2015-03-03 DIAGNOSIS — E119 Type 2 diabetes mellitus without complications: Secondary | ICD-10-CM | POA: Insufficient documentation

## 2015-03-03 NOTE — Progress Notes (Signed)
Nursing Home Location:  [place] of Service: SNF (44)  PCP: [name], MD  Allergies  Allergen Reactions  . Penicillins     Chief Complaint  Patient presents with  . Medical Management of Chronic Issues    HPI:  Patient is a 82 y.o. female seen today at [facility] for routine follow up on chronic conditions. She has a past medical history of DM, HTN, Breast CA, Depression, and osteoporosis. Staff reports pts appetite conts to be poor and takes her a long time to eat. Will drink supplement drink without difficulty. Questions trouble swallowing. Pt with ongoing memory loss and is a poor historian. No falls in the last month. Pt incontinent of bowel and bladder.  Review of Systems:  Review of Systems  Unable to perform ROS: Dementia    Past Medical History  Diagnosis Date  . DM (diabetes mellitus) ([place])   . HTN (hypertension)   . Hypercholesterolemia   . Depression   . Breast cancer ([place]) 1980    right  . Intrauterine pessary   . Wrist fracture 05/2011  . Osteoporosis   . Memory difficulty 05/15/2013  . Abnormality of gait 05/15/2013  . Dementia   . TIA (transient ischemic attack)    Past Surgical History  Procedure Laterality Date  . Mastectomy modified radical Right     30 years ago  . Intramedullary (im) nail intertrochanteric Left 08/27/2014    Procedure: INTRAMEDULLARY (IM) NAIL INTERTROCHANTRIC;  Surgeon: Renette Butters, MD;  Location: [place];  Service: Orthopedics;  Laterality: Left;   Social History:   reports that she has never smoked. She has never used smokeless tobacco. She reports that she does not drink alcohol or use illicit drugs.  Family History  Problem Relation Age of Onset  . Diabetes Father   . Cancer Father     leukemia  . Heart attack Brother   . Dementia Maternal Aunt     Medications: Patient's Medications  New Prescriptions   No medications on file  Previous Medications   ALPRAZOLAM (XANAX) 0.25 MG TABLET    Take one tablet by mouth once daily for anxiety   APIXABAN (ELIQUIS) 2.5 MG TABS TABLET    Take 1 tablet (2.5 mg total) by mouth 2 (two) times daily.   DILTIAZEM (DILACOR XR) 240 MG 24 HR CAPSULE    Take 240 mg by mouth daily. For Afib   DOCUSATE SODIUM (COLACE) 100 MG CAPSULE    Take 1 capsule (100 mg total) by mouth 2 (two) times daily.   DONEPEZIL (ARICEPT) 10 MG TABLET    Take 1 tablet (10 mg total) by mouth at bedtime.   EZETIMIBE (ZETIA) 10 MG TABLET    Take 10 mg by mouth daily.   GLIMEPIRIDE (AMARYL) 2 MG TABLET    Take 2 mg by mouth daily with breakfast.   HYDROCODONE-ACETAMINOPHEN (NORCO/VICODIN) 5-325 MG TABLET    1 by mouth every 6 hours as need for moderate pain, 2 by mouth as needed for severe pain DO NOT EXCEED 4GM OF APAP IN 24 HOURS FROM ALL SOURCES   INSULIN LISPRO (HUMALOG) 100 UNIT/ML INJECTION    Sliding scale dosing per CBG results BID 201-250=3 units, 251-350= 5 units, >351=call MD   MEMANTINE (NAMENDA XR) 28 MG CP24 24 HR CAPSULE    Take 1 capsule (28 mg total) by mouth daily.   MIRTAZAPINE (REMERON) 15 MG TABLET    Take  15 mg by mouth at bedtime.   ONDANSETRON (ZOFRAN) 4 MG TABLET    Take 1 tablet (4 mg total) by mouth every 8 (eight) hours as needed for nausea or vomiting.   OXYBUTYNIN (DITROPAN) 5 MG TABLET    Take 5 mg by mouth 2 (two) times daily.   PAROXETINE (PAXIL) 20 MG TABLET    Take 40 mg by mouth daily.    POLYETHYLENE GLYCOL (MIRALAX / GLYCOLAX) PACKET    Take 17 g by mouth daily.   SENNA-DOCUSATE (SENNA PLUS) 8.6-50 MG TABLET    Take 1 tablet by mouth 2 (two) times daily.  Modified Medications   No medications on file  Discontinued Medications   No medications on file     Physical Exam: Filed Vitals:   03/03/15 1628  BP: 130/76  Pulse: 80  Temp: 96.5 F (35.8 C)  Resp: 20  Weight: 108 lb 3.2 oz (49.079 kg)    Physical Exam  Constitutional:  Thin frail female   HENT:  Head: Normocephalic and atraumatic.    Mouth/Throat: Oropharynx is clear and moist.  Eyes: Pupils are equal, round, and reactive to light.  Neck: Normal range of motion. Neck supple.  Cardiovascular: Normal rate, regular rhythm and normal heart sounds.   Pulmonary/Chest: Effort normal and breath sounds normal.  Abdominal: Soft. Bowel sounds are normal.  Musculoskeletal: She exhibits no edema or tenderness.  Neurological: She is alert. She is disoriented (to situation, time. Oriented to self only).  Skin: Skin is warm and dry.  Psychiatric:  Memory loss noted    Labs reviewed: Basic Metabolic Panel:  Recent Labs  08/27/14 0214 08/28/14 0241  09/15/14 1504 01/11/15 02/23/15  NA 138 140  < > 139 141 141  K 3.8 3.7  < > 4.1 4.3 4.1  CL 106 105  --  104  --   --   CO2 25 27  --  23  --   --   GLUCOSE 116* 130*  --  188*  --   --   BUN 18 22*  < > 17 20 20   CREATININE 0.61 0.67  < > 0.72 0.6 0.6  CALCIUM 8.5* 8.4*  --  9.3  --   --   < > = values in this interval not displayed. Liver Function Tests:  Recent Labs  08/25/14 1537 10/08/14 1210 01/11/15  AST  --  14 14  ALT  --  11 15  ALKPHOS  --  143* 145*  ALBUMIN 3.5  --   --    No results for input(s): LIPASE, AMYLASE in the last 8760 hours. No results for input(s): AMMONIA in the last 8760 hours. CBC:  Recent Labs  08/25/14 0937  08/28/14 0241 08/29/14 0900  09/15/14 1504 01/11/15 02/23/15  WBC 13.8*  < > 9.6 8.1  < > 10.4 10.5 8.3  NEUTROABS 8.2*  --   --   --   --   --   --   --   HGB 12.5  < > 8.5* 7.9*  < > 12.4 13.1 11.8*  HCT 36.4  < > 25.2* 23.6*  < > 38.5 39 36  MCV 95.3  < > 95.1 95.5  --  98.2  --   --   PLT 239  < > 221 238  < > 499* 366 304  < > = values in this interval not displayed. TSH:  Recent Labs  08/25/14 1307 08/25/14 1537  TSH 0.545 0.457  A1C: Lab Results  Component Value Date   HGBA1C 6.8* 10/08/2014   Lipid Panel:  Recent Labs  08/26/14 0225 10/08/14 1210  CHOL 180 198  HDL 46 49  LDLCALC 113* 121   TRIG 107 142  CHOLHDL 3.9  --       Assessment/Plan 1. Loss of weight Ongoing weight loss, most likely due to progression of dementia, conts on Remeron for appetite, conts to be followed by RD for supplements, and will have ST evaluate for swallowing at this time.   2. Dementia, with behavioral disturbance No acute changes to mood or behaviors. Cognitive status remains unchanged. conts on aricept and namenda. Expect ongoing decline as disease progresses.   3. Uncontrolled type 2 diabetes mellitus without complication, without long-term current use of insulin (HCC) -blood sugars controlled on current medications. A1c 6.8 in August and at goal. Will cont SSI   4. Anxiety state Stable without worsening of anxiety noted. conts on paxil and xanax    5. Chronic atrial fibrillation (HCC) Rate controlled, conts on diltiazem at this time. Cont on eliquis for anticoagulation   6. Frequent falls -Being monitored by staff, without recent falls at this time.   7. Urinary incontinence, unspecified incontinence type -ongoing, will DC oxybutynin as it does not seem to be providing benefit at this time.     Brittney Powell. Harle Battiest  [facility] [phone] 8 am - 5 pm) [phone] (after hours)

## 2015-03-18 NOTE — Progress Notes (Signed)
HPI: follow-up atrial fibrillation. Patient previously admitted after falling and fracturing hip. She was noted to be in atrial fibrillation. She was treated with Cardizem for rate control and apixaban. Echocardiogram July 2016 showed normal LV function and mild biatrial enlargement. Thyroid ultrasound showed nodules largest being 4.6 cm. Pt instructed to fu with primary care for this issue. Chest CT showed no pulmonary embolus. TSH and free T4 normal. Since last seen, She denies dyspnea, chest pain, palpitations or syncope. No bleeding. She is residing at a nursing home because of dementia and poor functional capacity.  Current Outpatient Prescriptions  Medication Sig Dispense Refill  . ALPRAZolam (XANAX) 0.25 MG tablet Take one tablet by mouth once daily for anxiety 30 tablet 5  . apixaban (ELIQUIS) 2.5 MG TABS tablet Take 1 tablet (2.5 mg total) by mouth 2 (two) times daily. 60 tablet 60  . diltiazem (DILACOR XR) 240 MG 24 hr capsule Take 240 mg by mouth daily. For Afib    . docusate sodium (COLACE) 100 MG capsule Take 1 capsule (100 mg total) by mouth 2 (two) times daily. 10 capsule 0  . donepezil (ARICEPT) 10 MG tablet Take 1 tablet (10 mg total) by mouth at bedtime. 90 tablet 3  . ezetimibe (ZETIA) 10 MG tablet Take 10 mg by mouth daily.    [name] glimepiride (AMARYL) 2 MG tablet Take 2 mg by mouth daily with breakfast.    . HYDROcodone-acetaminophen (NORCO/VICODIN) 5-325 MG tablet 1 by mouth every 6 hours as need for moderate pain, 2 by mouth as needed for severe pain DO NOT EXCEED 4GM OF APAP IN 24 HOURS FROM ALL SOURCES    . insulin lispro (HUMALOG) 100 UNIT/ML injection Sliding scale dosing per CBG results BID 201-250=3 units, 251-350= 5 units, >351=call MD    . memantine (NAMENDA XR) 28 MG CP24 24 hr capsule Take 1 capsule (28 mg total) by mouth daily. 90 capsule 1  . mirtazapine (REMERON) 15 MG tablet Take 15 mg by mouth at bedtime.    . ondansetron (ZOFRAN) 4 MG tablet Take 1 tablet  (4 mg total) by mouth every 8 (eight) hours as needed for nausea or vomiting. 20 tablet 0  . oxybutynin (DITROPAN) 5 MG tablet Take 5 mg by mouth 2 (two) times daily.    [name] PARoxetine (PAXIL) 20 MG tablet Take 40 mg by mouth daily.     . polyethylene glycol (MIRALAX / GLYCOLAX) packet Take 17 g by mouth daily.    [name] senna-docusate (SENNA PLUS) 8.6-50 MG tablet Take 1 tablet by mouth 2 (two) times daily.     No current facility-administered medications for this visit.     Past Medical History  Diagnosis Date  . DM (diabetes mellitus) ([place])   . HTN (hypertension)   . Hypercholesterolemia   . Depression   . Breast cancer ([place]) 1980    right  . Intrauterine pessary   . Wrist fracture 05/2011  . Osteoporosis   . Memory difficulty 05/15/2013  . Abnormality of gait 05/15/2013  . Dementia   . TIA (transient ischemic attack)     Past Surgical History  Procedure Laterality Date  . Mastectomy modified radical Right     30 years ago  . Intramedullary (im) nail intertrochanteric Left 08/27/2014    Procedure: INTRAMEDULLARY (IM) NAIL INTERTROCHANTRIC;  Surgeon: Renette Butters, MD;  Location: [place];  Service: Orthopedics;  Laterality: Left;    Social History   Social History  . Marital  Status: Married    Spouse Name: [name]  . Number of Children: 2  . Years of Education: 9TH   Occupational History  . Retired    Social History Main Topics  . Smoking status: Never Smoker   . Smokeless tobacco: Never Used  . Alcohol Use: No  . Drug Use: No  . Sexual Activity: Not on file   Other Topics Concern  . Not on file   Social History Narrative   Patient is married to [name]).   Retired.    Education high school.    Right handed.    Family History  Problem Relation Age of Onset  . Diabetes Father   . Cancer Father     leukemia  . Heart attack Brother   . Dementia Maternal Aunt     ROS: no fevers or chills, productive cough, hemoptysis, dysphasia, odynophagia, melena,  hematochezia, dysuria, hematuria, rash, seizure activity, orthopnea, PND, pedal edema, claudication. Remaining systems are negative.  Physical Exam: Well-developed frail in no acute distress.  Skin is warm and dry.  HEENT is normal.  Neck is supple.  Chest is clear to auscultation with normal expansion.  Cardiovascular exam is irregular Abdominal exam nontender or distended. No masses palpated. Extremities show no edema. neuro grossly intact  ECG Atrial fibrillation, normal axis, incomplete right bundle branch block, low voltage.

## 2015-03-22 ENCOUNTER — Encounter: Payer: Self-pay | Admitting: Cardiology

## 2015-03-22 ENCOUNTER — Ambulatory Visit (INDEPENDENT_AMBULATORY_CARE_PROVIDER_SITE_OTHER): Payer: Medicare Other | Admitting: Cardiology

## 2015-03-22 DIAGNOSIS — I4821 Permanent atrial fibrillation: Secondary | ICD-10-CM

## 2015-03-22 DIAGNOSIS — I482 Chronic atrial fibrillation: Secondary | ICD-10-CM | POA: Diagnosis not present

## 2015-03-22 DIAGNOSIS — I4891 Unspecified atrial fibrillation: Secondary | ICD-10-CM

## 2015-03-22 NOTE — Assessment & Plan Note (Signed)
Blood pressure controlled. Continue present medications. 

## 2015-03-22 NOTE — Patient Instructions (Signed)
Your physician wants you to follow-up in: 6 MONTHS WITH DR CRENSHAW You will receive a reminder letter in the mail two months in advance. If you don't receive a letter, please call our office to schedule the follow-up appointment.  

## 2015-03-22 NOTE — Assessment & Plan Note (Signed)
Patient remains in permanent atrial fibrillation. Continue Cardizem for rate control. Continue apixaban. If she has further falls in the future we would need to consider discontinuing as risk may outweigh benefit.

## 2015-03-29 ENCOUNTER — Encounter: Payer: Self-pay | Admitting: Adult Health

## 2015-03-29 ENCOUNTER — Non-Acute Institutional Stay (SKILLED_NURSING_FACILITY): Payer: Medicare Other | Admitting: Nurse Practitioner

## 2015-03-29 ENCOUNTER — Ambulatory Visit (INDEPENDENT_AMBULATORY_CARE_PROVIDER_SITE_OTHER): Payer: Medicare Other | Admitting: Adult Health

## 2015-03-29 ENCOUNTER — Encounter: Payer: Self-pay | Admitting: Nurse Practitioner

## 2015-03-29 VITALS — BP 132/82 | HR 95

## 2015-03-29 DIAGNOSIS — I480 Paroxysmal atrial fibrillation: Secondary | ICD-10-CM

## 2015-03-29 DIAGNOSIS — F0390 Unspecified dementia without behavioral disturbance: Secondary | ICD-10-CM

## 2015-03-29 DIAGNOSIS — D649 Anemia, unspecified: Secondary | ICD-10-CM | POA: Diagnosis not present

## 2015-03-29 DIAGNOSIS — F411 Generalized anxiety disorder: Secondary | ICD-10-CM

## 2015-03-29 DIAGNOSIS — I10 Essential (primary) hypertension: Secondary | ICD-10-CM

## 2015-03-29 DIAGNOSIS — E1165 Type 2 diabetes mellitus with hyperglycemia: Secondary | ICD-10-CM

## 2015-03-29 DIAGNOSIS — R634 Abnormal weight loss: Secondary | ICD-10-CM | POA: Diagnosis not present

## 2015-03-29 DIAGNOSIS — R52 Pain, unspecified: Secondary | ICD-10-CM | POA: Diagnosis not present

## 2015-03-29 DIAGNOSIS — IMO0001 Reserved for inherently not codable concepts without codable children: Secondary | ICD-10-CM

## 2015-03-29 NOTE — Progress Notes (Signed)
I have read the note, and I agree with the clinical assessment and plan.  [name],CHARLES KEITH   

## 2015-03-29 NOTE — Progress Notes (Signed)
Nursing Home Location:  [place] of Service: SNF (53)  PCP: [name], MD  Allergies  Allergen Reactions  . Penicillins Other (See Comments)    Unsure of reaction    Chief Complaint  Patient presents with  . Medical Management of Chronic Issues    Routine Visit    HPI:  Patient is a 82 y.o. female seen today at [facility] for routine follow up on chronic conditions. She has a past medical history of DM, HTN, Breast CA, Depression, and osteoporosis. Pt following with neurology due to dementia, had appt today. Progressive decline with decrease in appetite. Pt only eating well for husband when he is there to fed her.  No increase in behaviors noted.  Husband with pt today. Questions if she needs hydrocodone for pain. On medication list as PRN. Not having any pain.   Review of Systems:  Review of Systems  Unable to perform ROS: Dementia    Past Medical History  Diagnosis Date  . DM (diabetes mellitus) ([place])   . HTN (hypertension)   . Hypercholesterolemia   . Depression   . Breast cancer ([place]) 1980    right  . Intrauterine pessary   . Wrist fracture 05/2011  . Osteoporosis   . Memory difficulty 05/15/2013  . Abnormality of gait 05/15/2013  . Dementia   . TIA (transient ischemic attack)    Past Surgical History  Procedure Laterality Date  . Mastectomy modified radical Right     30 years ago  . Intramedullary (im) nail intertrochanteric Left 08/27/2014    Procedure: INTRAMEDULLARY (IM) NAIL INTERTROCHANTRIC;  Surgeon: Renette Butters, MD;  Location: [place];  Service: Orthopedics;  Laterality: Left;   Social History:   reports that she has never smoked. She has never used smokeless tobacco. She reports that she does not drink alcohol or use illicit drugs.  Family History  Problem Relation Age of Onset  . Diabetes Father   . Cancer Father     leukemia  . Heart attack Brother   . Dementia Maternal Aunt      Medications: Patient's Medications  New Prescriptions   No medications on file  Previous Medications   ALPRAZOLAM (XANAX) 0.25 MG TABLET    Take one tablet by mouth once daily for anxiety   APIXABAN (ELIQUIS) 2.5 MG TABS TABLET    Take 1 tablet (2.5 mg total) by mouth 2 (two) times daily.   DILTIAZEM (DILACOR XR) 240 MG 24 HR CAPSULE    Take 240 mg by mouth daily. For Afib   DOCUSATE SODIUM (COLACE) 100 MG CAPSULE    Take 1 capsule (100 mg total) by mouth 2 (two) times daily.   DONEPEZIL (ARICEPT) 10 MG TABLET    Take 1 tablet (10 mg total) by mouth at bedtime.   EZETIMIBE (ZETIA) 10 MG TABLET    Take 10 mg by mouth daily.   GLIMEPIRIDE (AMARYL) 2 MG TABLET    Take 2 mg by mouth daily with breakfast.   HYDROCODONE-ACETAMINOPHEN (NORCO/VICODIN) 5-325 MG TABLET    1 by mouth every 6 hours as need for moderate pain, 2 by mouth as needed for severe pain DO NOT EXCEED 4GM OF APAP IN 24 HOURS FROM ALL SOURCES   INSULIN LISPRO (HUMALOG) 100 UNIT/ML INJECTION    Sliding scale dosing per CBG results BID 201-250=3 units, 251-350= 5 units, >351=call MD   MEMANTINE (NAMENDA XR) 28 MG CP24 24 HR  CAPSULE    Take 1 capsule (28 mg total) by mouth daily.   MIRTAZAPINE (REMERON) 15 MG TABLET    Take 15 mg by mouth at bedtime.   ONDANSETRON (ZOFRAN) 4 MG TABLET    Take 1 tablet (4 mg total) by mouth every 8 (eight) hours as needed for nausea or vomiting.   PAROXETINE (PAXIL) 20 MG TABLET    Take 40 mg by mouth daily.    POLYETHYLENE GLYCOL (MIRALAX / GLYCOLAX) PACKET    Take 17 g by mouth daily.   SENNA-DOCUSATE (SENNA PLUS) 8.6-50 MG TABLET    Take 1 tablet by mouth 2 (two) times daily.  Modified Medications   No medications on file  Discontinued Medications   OXYBUTYNIN (DITROPAN) 5 MG TABLET    Take 5 mg by mouth 2 (two) times daily.     Physical Exam: Filed Vitals:   03/29/15 0830  BP: 116/69  Pulse: 103  Temp: 97.9 F (36.6 C)  Resp: 18  Height: 5\' 6"  (1.676 m)  Weight: 110 lb 6.4 oz  (50.077 kg)  SpO2: 97%    Physical Exam  Constitutional:  Thin frail female   HENT:  Head: Normocephalic and atraumatic.  Mouth/Throat: Oropharynx is clear and moist.  Eyes: Pupils are equal, round, and reactive to light.  Neck: Normal range of motion. Neck supple.  Cardiovascular: Normal rate, regular rhythm and normal heart sounds.   Pulmonary/Chest: Effort normal and breath sounds normal.  Abdominal: Soft. Bowel sounds are normal.  Musculoskeletal: She exhibits no edema or tenderness.  Neurological: She is alert. She is disoriented (to situation, time. Oriented to self only).  Skin: Skin is warm and dry.  Psychiatric:  Memory loss noted    Labs reviewed: Basic Metabolic Panel:  Recent Labs  08/27/14 0214 08/28/14 0241  09/15/14 1504 01/11/15 02/23/15  NA 138 140  < > 139 141 141  K 3.8 3.7  < > 4.1 4.3 4.1  CL 106 105  --  104  --   --   CO2 25 27  --  23  --   --   GLUCOSE 116* 130*  --  188*  --   --   BUN 18 22*  < > 17 20 20   CREATININE 0.61 0.67  < > 0.72 0.6 0.6  CALCIUM 8.5* 8.4*  --  9.3  --   --   < > = values in this interval not displayed. Liver Function Tests:  Recent Labs  08/25/14 1537 10/08/14 1210 01/11/15  AST  --  14 14  ALT  --  11 15  ALKPHOS  --  143* 145*  ALBUMIN 3.5  --   --    No results for input(s): LIPASE, AMYLASE in the last 8760 hours. No results for input(s): AMMONIA in the last 8760 hours. CBC:  Recent Labs  08/25/14 0937  08/28/14 0241 08/29/14 0900  09/15/14 1504 01/11/15 02/23/15  WBC 13.8*  < > 9.6 8.1  < > 10.4 10.5 8.3  NEUTROABS 8.2*  --   --   --   --   --   --   --   HGB 12.5  < > 8.5* 7.9*  < > 12.4 13.1 11.8*  HCT 36.4  < > 25.2* 23.6*  < > 38.5 39 36  MCV 95.3  < > 95.1 95.5  --  98.2  --   --   PLT 239  < > 221 238  < > 499* 366  304  < > = values in this interval not displayed. TSH:  Recent Labs  08/25/14 1307 08/25/14 1537  TSH 0.545 0.457   A1C: Lab Results  Component Value Date   HGBA1C  6.8* 10/08/2014   Lipid Panel:  Recent Labs  08/26/14 0225 10/08/14 1210  CHOL 180 198  HDL 46 49  LDLCALC 113* 121  TRIG 107 142  CHOLHDL 3.9  --       Assessment/Plan 1. Loss of weight Weight has been stable in the last month, eating well with husband but does not eat as good with assistance of staff. Will cont supplements and remeron for appetite. Anticipate further decline as dementia progresses.   2. Essential hypertension  blood pressure stable. conts on current regimen  3. Uncontrolled type 2 diabetes mellitus without complication, without long-term current use of insulin (HCC) - stable, will follow up A1c at this time. conts on amaryl and SSI  4. Paroxysmal atrial fibrillation (HCC) -rate controlled, conts on diltiazem and eliquis for anticoagulation   5. Anxiety state - with increase anxiety at times however overall better controlled. conts on paxil 40 mg and remeron 15 mg   6. Anemia -hgb trending down from previous lab, no signs of blood loss, will follow up cbc  7. Pain No noted pain at this time -will DC hydrocodone and pt can use tylenol PRN if needed   Saxon Barich K. Harle Battiest  [facility] [phone] 8 am - 5 pm) [phone] (after hours)

## 2015-03-29 NOTE — Progress Notes (Signed)
PATIENT: [name] DOB: 07-22-1927  REASON FOR VISIT: follow up-  memory HISTORY FROM:  Husband  HISTORY OF PRESENT ILLNESS:  Ms. Cruey is an 82 year old female with a history of progressive dementia. She returns today for follow-up. She continues to take Aricept and Namenda. She is tolerating these medications well. She is currently living at [facility] and she requires assistance with all ADLs. Husband states that  He was told that she has been losing  weight however he contributes this to the staff not feeding her adequately when he is not there. He does state that the  Patient's memory continues to decline. He states that she continues to have her crying episodes but this is usually due to her not being at home in stating that she misses her husband. He states that this has not increased  Since the last visit. He denies any significant agitation or aggressiveness. Patient returns today for an evaluation.  HISTORY 03/23/14: Ms. Sartell is an 82 year old female with a history of rapidly progressive dementia. She returns today for follow-up. She is currently taking Aricept and Namenda. Patient also has a gait disorder- Physical therapy was recommended but people coming into the patient's home caused anxiety. Denies any falls since the last visit but has had some stumbles. Husband states that her memory has gotten worse. She needs assistance with ADLS. She does not operate a motor vehicle. Husband does all the cooking and fiances. Patient will get very anxious and that will cause some agitation. Denies any aggression. Patient is sleeping well at night according to the husband. He does states that she may move her hands or reach for things while sleeping. No new medical issues since last seen.   HISTORY 09/16/13 ([name]): Ms. Kuenzel is an 82 year old right-handed white female with a history of onset of a relatively rapidly progressive dementia. The issue started in September 2014. The patient also  had an associated problem with gait instability, but she has not fallen recently. The patient has been placed on Aricept and Namenda. She did have an associated problem with weight loss, but this seems to have stabilized, and the patient has gained 11 pounds since last seen. The patient has undergone blood work and spinal fluid evaluation without any significant abnormalities. The protein level in the spinal fluid was slightly elevated. Cytology was negative. A paraneoplastic antibody panel was negative. Since last seen, the husband indicates that the patient has had difficulty knowing how to operate a cell phone and how to operate a remote control for the TV. Bowel control seems to be unaffected. The patient is sleeping well at night. The patient has had transient skin rashes affecting the neck, face, and arm.   REVIEW OF SYSTEMS: Out of a complete 14 system review of symptoms, the patient complains only of the following symptoms, and all other reviewed systems are negative.   speech difficulty, walking difficulty, nervous/anxious, unexpected weight change  ALLERGIES: Allergies  Allergen Reactions  . Penicillins Other (See Comments)    Unsure of reaction    HOME MEDICATIONS: Outpatient Prescriptions Prior to Visit  Medication Sig Dispense Refill  . ALPRAZolam (XANAX) 0.25 MG tablet Take one tablet by mouth once daily for anxiety 30 tablet 5  . apixaban (ELIQUIS) 2.5 MG TABS tablet Take 1 tablet (2.5 mg total) by mouth 2 (two) times daily. 60 tablet 60  . diltiazem (DILACOR XR) 240 MG 24 hr capsule Take 240 mg by mouth daily. For Afib    .  docusate sodium (COLACE) 100 MG capsule Take 1 capsule (100 mg total) by mouth 2 (two) times daily. 10 capsule 0  . donepezil (ARICEPT) 10 MG tablet Take 1 tablet (10 mg total) by mouth at bedtime. 90 tablet 3  . ezetimibe (ZETIA) 10 MG tablet Take 10 mg by mouth daily.    [name] glimepiride (AMARYL) 2 MG tablet Take 2 mg by mouth daily with breakfast.    .  HYDROcodone-acetaminophen (NORCO/VICODIN) 5-325 MG tablet 1 by mouth every 6 hours as need for moderate pain, 2 by mouth as needed for severe pain DO NOT EXCEED 4GM OF APAP IN 24 HOURS FROM ALL SOURCES    . insulin lispro (HUMALOG) 100 UNIT/ML injection Sliding scale dosing per CBG results BID 201-250=3 units, 251-350= 5 units, >351=call MD    . memantine (NAMENDA XR) 28 MG CP24 24 hr capsule Take 1 capsule (28 mg total) by mouth daily. 90 capsule 1  . mirtazapine (REMERON) 15 MG tablet Take 15 mg by mouth at bedtime.    . ondansetron (ZOFRAN) 4 MG tablet Take 1 tablet (4 mg total) by mouth every 8 (eight) hours as needed for nausea or vomiting. 20 tablet 0  . PARoxetine (PAXIL) 20 MG tablet Take 40 mg by mouth daily.     . polyethylene glycol (MIRALAX / GLYCOLAX) packet Take 17 g by mouth daily.    [name] senna-docusate (SENNA PLUS) 8.6-50 MG tablet Take 1 tablet by mouth 2 (two) times daily.     No facility-administered medications prior to visit.    PAST MEDICAL HISTORY: Past Medical History  Diagnosis Date  . DM (diabetes mellitus) ([place])   . HTN (hypertension)   . Hypercholesterolemia   . Depression   . Breast cancer ([place]) 1980    right  . Intrauterine pessary   . Wrist fracture 05/2011  . Osteoporosis   . Memory difficulty 05/15/2013  . Abnormality of gait 05/15/2013  . Dementia   . TIA (transient ischemic attack)     PAST SURGICAL HISTORY: Past Surgical History  Procedure Laterality Date  . Mastectomy modified radical Right     30 years ago  . Intramedullary (im) nail intertrochanteric Left 08/27/2014    Procedure: INTRAMEDULLARY (IM) NAIL INTERTROCHANTRIC;  Surgeon: Renette Butters, MD;  Location: [place];  Service: Orthopedics;  Laterality: Left;    FAMILY HISTORY: Family History  Problem Relation Age of Onset  . Diabetes Father   . Cancer Father     leukemia  . Heart attack Brother   . Dementia Maternal Aunt     SOCIAL HISTORY: Social History   Social History  .  Marital Status: Married    Spouse Name: [name]  . Number of Children: 2  . Years of Education: 9TH   Occupational History  . Retired    Social History Main Topics  . Smoking status: Never Smoker   . Smokeless tobacco: Never Used  . Alcohol Use: No  . Drug Use: No  . Sexual Activity: Not on file   Other Topics Concern  . Not on file   Social History Narrative   Patient is married to [name]).   Retired.    Education high school.    Right handed.      PHYSICAL EXAM  Filed Vitals:   03/29/15 1349  BP: 132/82  Pulse: 95   There is no weight on file to calculate BMI.  MMSE - Mini Mental State Exam 03/29/2015 09/23/2014  Not completed: Refused -  Orientation to time 0 1  Orientation to Place 1 0  Registration 3 3  Attention/ Calculation 0 0  Recall 0 0  Language- name 2 objects 2 2  Language- repeat 0 0  Language- follow 3 step command 1 0  Language- read & follow direction 1 0  Write a sentence 0 0  Copy design 0 0  Total score 8 6     Generalized: Well developed, in no acute distress   Neurological examination  Mentation: Alert.  Speech is not fluent  sentences are not,  comprehensible at times. Cranial nerve II-XII: Pupils were equal round reactive to light. Extraocular movements were full, visual field were full on confrontational test. Facial sensation and strength were normal. Uvula tongue midline. Head turning and shoulder shrug  were normal and symmetric. Motor: The motor testing reveals 5 over 5 strength  In the upper extremities 45 strength in the lower extremities. Good symmetric motor tone is noted throughout.  Sensory:  Unable to test Coordination:  Patient unable to comprehend directions Gait and station:  Patient in a wheelchair. Two-person assist for standing. Reflexes: Deep tendon reflexes are symmetric and normal bilaterally.   DIAGNOSTIC DATA (LABS, IMAGING, TESTING) - I reviewed patient records, labs, notes, testing and imaging myself where  available.  Lab Results  Component Value Date   WBC 8.3 02/23/2015   HGB 11.8* 02/23/2015   HCT 36 02/23/2015   MCV 98.2 09/15/2014   PLT 304 02/23/2015      Component Value Date/Time   NA 141 02/23/2015   NA 139 09/15/2014 1504   K 4.1 02/23/2015   CL 104 09/15/2014 1504   CO2 23 09/15/2014 1504   GLUCOSE 188* 09/15/2014 1504   BUN 20 02/23/2015   BUN 17 09/15/2014 1504   CREATININE 0.6 02/23/2015   CREATININE 0.72 09/15/2014 1504   CREATININE 0.67 08/28/2014 0241   CALCIUM 9.3 09/15/2014 1504   ALBUMIN 3.5 08/25/2014 1537   AST 14 01/11/2015   ALT 15 01/11/2015   ALKPHOS 145* 01/11/2015   GFRNONAA 77 09/15/2014 1504   GFRNONAA >60 08/28/2014 0241   GFRAA 89 09/15/2014 1504   GFRAA >60 08/28/2014 0241   Lab Results  Component Value Date   CHOL 198 10/08/2014   HDL 49 10/08/2014   LDLCALC 121 10/08/2014   TRIG 142 10/08/2014   CHOLHDL 3.9 08/26/2014         ASSESSMENT AND PLAN 82 y.o. year old female  has a past medical history of DM (diabetes mellitus) ([place]); HTN (hypertension); Hypercholesterolemia; Depression; Breast cancer ([place]) (1980); Intrauterine pessary; Wrist fracture (05/2011); Osteoporosis; Memory difficulty (05/15/2013); Abnormality of gait (05/15/2013); Dementia; and TIA (transient ischemic attack). here with:  1. Memory disorder    patient's memory score MMSE is 6 out of 30. She will continue on Aricept and Namenda for now. I did discuss with the husband about possibly discontinuing these medications in the future. He verbalized understanding. The patient does want to eliminate some of her medications. I suggested hydrocodone if she does not need this for pain.  I advised that if her symptoms worsen or she develops new symptoms he should let us know.The patient will follow-up in 6 months or sooner if needed.  Ward Givens, MSN, NP-C 03/29/2015, 1:11 PM [facility] [address], [place], [place] [address] [phone]

## 2015-03-29 NOTE — Patient Instructions (Signed)
Continue Namenda and Aricept She is taking pain medication- Hydrocodone? If your symptoms worsen or you develop new symptoms please let us know.

## 2015-04-07 LAB — CBC AND DIFFERENTIAL
HEMATOCRIT: 40 % (ref 36–46)
Hemoglobin: 13.5 g/dL (ref 12.0–16.0)
PLATELETS: 358 10*3/uL (ref 150–399)
WBC: 11.8 10*3/mL

## 2015-04-09 ENCOUNTER — Non-Acute Institutional Stay (SKILLED_NURSING_FACILITY): Payer: Medicare Other | Admitting: Family

## 2015-04-09 ENCOUNTER — Non-Acute Institutional Stay: Payer: Self-pay | Admitting: Family

## 2015-04-09 DIAGNOSIS — J189 Pneumonia, unspecified organism: Secondary | ICD-10-CM | POA: Diagnosis not present

## 2015-04-09 DIAGNOSIS — R059 Cough, unspecified: Secondary | ICD-10-CM

## 2015-04-09 DIAGNOSIS — R05 Cough: Secondary | ICD-10-CM

## 2015-04-09 MED ORDER — LEVOFLOXACIN 750 MG PO TABS: 750.0000 mg | ORAL_TABLET | Freq: Every day | ORAL | Status: DC

## 2015-04-09 MED ORDER — SACCHAROMYCES BOULARDII 250 MG PO CAPS: 250.0000 mg | ORAL_CAPSULE | Freq: Two times a day (BID) | ORAL | Status: DC

## 2015-04-09 NOTE — Progress Notes (Signed)
Patient ID: Brittney Powell, female   DOB: [birth year], 82 y.o.   MRN: [id number]

## 2015-04-09 NOTE — Addendum Note (Signed)
Addended byMarlowe Sax C on: 04/09/2015 01:35 PM   Modules accepted: Orders

## 2015-04-09 NOTE — Progress Notes (Signed)
Patient ID: Brittney Powell, female   DOB: 10-Oct-1927, 82 y.o.   MRN: [id number]  Location:  [facility]  Provider: Blanchie Serve, MD   [name], MD  Code Status:  Full Code  Goals of care: Advanced Directive information Advanced Directives 03/29/2015  Does patient have an advance directive? No  Type of Advance Directive -  Would patient like information on creating an advanced directive? No - patient declined information     Chief Complaint  Patient presents with  . Acute Visit    Chest X-ray results     HPI:  Pt is a 82 y.o. female seen today at [facility] for abnormal chest X-ray results. She has a past medical history of Type 2 DM, Depression, HTN, Dementia, Osteoporosis, Breast cancer amongst others. She is seen in her room today. Recent CBC/diff results showed leukocytosis 11.8 (04/07/2015). Portable CXR results showed Mild patchy left basilar density compatible with pneumonia. Patient's history limited due to presence of dementia. Afebrile.     Review of Systems  Unable to perform ROS: dementia    Past Medical History  Diagnosis Date  . DM (diabetes mellitus) ([place])   . HTN (hypertension)   . Hypercholesterolemia   . Depression   . Breast cancer ([place]) 1980    right  . Intrauterine pessary   . Wrist fracture 05/2011  . Osteoporosis   . Memory difficulty 05/15/2013  . Abnormality of gait 05/15/2013  . Dementia   . TIA (transient ischemic attack)    Past Surgical History  Procedure Laterality Date  . Mastectomy modified radical Right     30 years ago  . Intramedullary (im) nail intertrochanteric Left 08/27/2014    Procedure: INTRAMEDULLARY (IM) NAIL INTERTROCHANTRIC;  Surgeon: Renette Butters, MD;  Location: [place];  Service: Orthopedics;  Laterality: Left;    Allergies  Allergen Reactions  . Penicillins Other (See Comments)    Unsure of reaction      Medication List       This list is accurate as of:  04/09/15  1:00 PM.  Always use your most recent med list.               ALPRAZolam 0.25 MG tablet  Commonly known as:  XANAX  Take one tablet by mouth once daily for anxiety     apixaban 2.5 MG Tabs tablet  Commonly known as:  ELIQUIS  Take 1 tablet (2.5 mg total) by mouth 2 (two) times daily.     diltiazem 240 MG 24 hr capsule  Commonly known as:  DILACOR XR  Take 240 mg by mouth daily. For Afib     docusate sodium 100 MG capsule  Commonly known as:  COLACE  Take 1 capsule (100 mg total) by mouth 2 (two) times daily.     donepezil 10 MG tablet  Commonly known as:  ARICEPT  Take 1 tablet (10 mg total) by mouth at bedtime.     ezetimibe 10 MG tablet  Commonly known as:  ZETIA  Take 10 mg by mouth daily.     glimepiride 2 MG tablet  Commonly known as:  AMARYL  Take 2 mg by mouth daily with breakfast.     HUMALOG 100 UNIT/ML injection  Generic drug:  insulin lispro  Sliding scale dosing per CBG results BID 201-250=3 units, 251-350= 5 units, >351=call MD     HYDROcodone-acetaminophen 5-325 MG tablet  Commonly known as:  NORCO/VICODIN  1 by mouth  every 6 hours as need for moderate pain, 2 by mouth as needed for severe pain DO NOT EXCEED 4GM OF APAP IN 24 HOURS FROM ALL SOURCES     memantine 28 MG Cp24 24 hr capsule  Commonly known as:  NAMENDA XR  Take 1 capsule (28 mg total) by mouth daily.     mirtazapine 15 MG tablet  Commonly known as:  REMERON  Take 15 mg by mouth at bedtime.     ondansetron 4 MG tablet  Commonly known as:  ZOFRAN  Take 1 tablet (4 mg total) by mouth every 8 (eight) hours as needed for nausea or vomiting.     PARoxetine 20 MG tablet  Commonly known as:  PAXIL  Take 40 mg by mouth daily.     polyethylene glycol packet  Commonly known as:  MIRALAX / GLYCOLAX  Take 17 g by mouth daily.     SENNA PLUS 8.6-50 MG tablet  Generic drug:  senna-docusate  Take 1 tablet by mouth 2 (two) times daily.        Immunization History  Administered  Date(s) Administered  . Influenza-Unspecified 12/04/2014  . PPD Test 10/28/2014   Pertinent  Health Maintenance Due  Topic Date Due  . FOOT EXAM  09[birth year]/1939  . OPHTHALMOLOGY EXAM  09/22/1939  . URINE MICROALBUMIN  09/22/1939  . PNA vac Low Risk Adult (1 of 2 - PCV13) 03/27/2016 (Originally 09/22/1994)  . DEXA SCAN  03/26/2023 (Originally 09/22/1994)  . HEMOGLOBIN A1C  04/10/2015  . INFLUENZA VACCINE  09/21/2015   No flowsheet data found.  Filed Vitals:   04/09/15 1240  BP: 118/74  Pulse: 69  Temp: 98.1 F (36.7 C)  Resp: 18  Weight: 108 lb 3.2 oz (49.079 kg)  SpO2: 94%   Body mass index is 17.47 kg/(m^2). Physical Exam  Constitutional: She appears well-developed and well-nourished.  Frail Elderly.   HENT:  Head: Normocephalic.  Mouth/Throat: Oropharynx is clear and moist.  Eyes: EOM are normal. Pupils are equal, round, and reactive to light. Right eye exhibits no discharge. Left eye exhibits no discharge. No scleral icterus.  Neck: Normal range of motion. No JVD present. No tracheal deviation present.  Cardiovascular: Normal rate, regular rhythm, normal heart sounds and intact distal pulses.   Pulmonary/Chest: Effort normal. No respiratory distress. She has no wheezes.  Left lung lobe crackles noted.   Abdominal: Soft. Bowel sounds are normal. She exhibits no mass. There is no tenderness. There is no rebound and no guarding.  Musculoskeletal: She exhibits no edema.  Lymphadenopathy:    She has no cervical adenopathy.  Neurological: She is alert.  Skin: Skin is warm and dry. No rash noted. No erythema. No pallor.  Psychiatric: She has a normal mood and affect.    Labs reviewed:  Recent Labs  08/27/14 0214 08/28/14 0241  09/15/14 1504 01/11/15 02/23/15  NA 138 140  < > 139 141 141  K 3.8 3.7  < > 4.1 4.3 4.1  CL 106 105  --  104  --   --   CO2 25 27  --  23  --   --   GLUCOSE 116* 130*  --  188*  --   --   BUN 18 22*  < > 17 20 20   CREATININE 0.61 0.67  < > 0.72  0.6 0.6  CALCIUM 8.5* 8.4*  --  9.3  --   --   < > = values in this interval not displayed.  Recent Labs  08/25/14 1537 10/08/14 1210 01/11/15  AST  --  14 14  ALT  --  11 15  ALKPHOS  --  143* 145*  ALBUMIN 3.5  --   --     Recent Labs  08/25/14 0937  08/28/14 0241 08/29/14 0900  09/15/14 1504 01/11/15 02/23/15  WBC 13.8*  < > 9.6 8.1  < > 10.4 10.5 8.3  NEUTROABS 8.2*  --   --   --   --   --   --   --   HGB 12.5  < > 8.5* 7.9*  < > 12.4 13.1 11.8*  HCT 36.4  < > 25.2* 23.6*  < > 38.5 39 36  MCV 95.3  < > 95.1 95.5  --  98.2  --   --   PLT 239  < > 221 238  < > 499* 366 304  < > = values in this interval not displayed. Lab Results  Component Value Date   TSH 0.457 08/25/2014   Lab Results  Component Value Date   HGBA1C 6.8* 10/08/2014   Lab Results  Component Value Date   CHOL 198 10/08/2014   HDL 49 10/08/2014   LDLCALC 121 10/08/2014   TRIG 142 10/08/2014   CHOLHDL 3.9 08/26/2014    Significant Diagnostic Results in last 30 days:  No results found.  Assessment/Plan CAP (community acquired pneumonia) Afebrile. Leukocytosis 11.8 ( 04/07/2015) Exam findings crackles to left lung. CXR results showed Mild patchy left basilar density compatible with pneumonia. Will order Levaquin 750 mg Tablet daily X 5 days. Monitor V/S Q 8 Hrs. Cough syrup PRN.     Family/ staff Communication: Reviewed Plan of care with Facility staff Nurse Supervisor.

## 2015-04-23 ENCOUNTER — Non-Acute Institutional Stay (SKILLED_NURSING_FACILITY): Payer: Medicare Other | Admitting: Family

## 2015-04-23 DIAGNOSIS — R269 Unspecified abnormalities of gait and mobility: Secondary | ICD-10-CM

## 2015-04-23 DIAGNOSIS — F0390 Unspecified dementia without behavioral disturbance: Secondary | ICD-10-CM

## 2015-04-23 DIAGNOSIS — I10 Essential (primary) hypertension: Secondary | ICD-10-CM | POA: Diagnosis not present

## 2015-04-23 DIAGNOSIS — I482 Chronic atrial fibrillation, unspecified: Secondary | ICD-10-CM

## 2015-04-23 NOTE — Progress Notes (Signed)
Patient ID: Brittney Powell, female   DOB: [birth year], 82 y.o.   MRN: [id number]  Location:  [address] of Service:  SNF (31) Provider: Blanchie Serve, MD   [name], MD  Patient Care Team: [name], MD as PCP - General [facility])  Extended Emergency Contact Information Primary Emergency Contact: Lakina, Corless Address: [place] [address] Johnnette Litter of [place] Phone: [phone] Relation: Spouse Secondary Emergency Contact: Ponds,David Address: [address]          [place], [place] [address] Johnnette Litter of [place] Phone: [phone] Mobile Phone: [phone] Relation: Son  Code Status: Full Code  Goals of care: Advanced Directive information Advanced Directives 03/29/2015  Does patient have an advance directive? No  Type of Advance Directive -  Would patient like information on creating an advanced directive? No - patient declined information     Chief Complaint  Patient presents with  . Medical Management of Chronic Issues    HPI:  Pt is a 82 y.o. female seen today at [facility] for medical management of chronic diseases. She has a medical history of HTN, Dementia, Depression, Anxiety, TIA amongst others. She is seen in her room today with husband at bedside. Patient history limited due to dementia  But husband states she is doing much better after she was treated for pneumonia. No acute issues reported. Facility staff reports no new concerns.    Past Medical History  Diagnosis Date  . DM (diabetes mellitus) ([place])   . HTN (hypertension)   . Hypercholesterolemia   . Depression   . Breast cancer ([place]) 1980    right  . Intrauterine pessary   . Wrist fracture 05/2011  . Osteoporosis   . Memory difficulty 05/15/2013  . Abnormality of gait 05/15/2013  . Dementia   . TIA (transient ischemic attack)    Past Surgical History  Procedure Laterality Date  .  Mastectomy modified radical Right     30 years ago  . Intramedullary (im) nail intertrochanteric Left 08/27/2014    Procedure: INTRAMEDULLARY (IM) NAIL INTERTROCHANTRIC;  Surgeon: Renette Butters, MD;  Location: [place];  Service: Orthopedics;  Laterality: Left;    Allergies  Allergen Reactions  . Penicillins Other (See Comments)    Unsure of reaction      Medication List       This list is accurate as of: 04/23/15  3:56 PM.  Always use your most recent med list.               ALPRAZolam 0.25 MG tablet  Commonly known as:  XANAX  Take one tablet by mouth once daily for anxiety     apixaban 2.5 MG Tabs tablet  Commonly known as:  ELIQUIS  Take 1 tablet (2.5 mg total) by mouth 2 (two) times daily.     diltiazem 240 MG 24 hr capsule  Commonly known as:  DILACOR XR  Take 240 mg by mouth daily. For Afib     docusate sodium 100 MG capsule  Commonly known as:  COLACE  Take 1 capsule (100 mg total) by mouth 2 (two) times daily.     donepezil 10 MG tablet  Commonly known as:  ARICEPT  Take 1 tablet (10 mg total) by mouth at bedtime.     ezetimibe 10 MG tablet  Commonly known as:  ZETIA  Take 10 mg by  mouth daily.     glimepiride 2 MG tablet  Commonly known as:  AMARYL  Take 2 mg by mouth daily with breakfast.     HUMALOG 100 UNIT/ML injection  Generic drug:  insulin lispro  Sliding scale dosing per CBG results BID 201-250=3 units, 251-350= 5 units, >351=call MD     HYDROcodone-acetaminophen 5-325 MG tablet  Commonly known as:  NORCO/VICODIN  1 by mouth every 6 hours as need for moderate pain, 2 by mouth as needed for severe pain DO NOT EXCEED 4GM OF APAP IN 24 HOURS FROM ALL SOURCES     memantine 28 MG Cp24 24 hr capsule  Commonly known as:  NAMENDA XR  Take 1 capsule (28 mg total) by mouth daily.     mirtazapine 15 MG tablet  Commonly known as:  REMERON  Take 15 mg by mouth at bedtime.     ondansetron 4 MG tablet  Commonly known as:  ZOFRAN  Take 1 tablet (4 mg  total) by mouth every 8 (eight) hours as needed for nausea or vomiting.     PARoxetine 20 MG tablet  Commonly known as:  PAXIL  Take 40 mg by mouth daily.     polyethylene glycol packet  Commonly known as:  MIRALAX / GLYCOLAX  Take 17 g by mouth daily.     SENNA PLUS 8.6-50 MG tablet  Generic drug:  senna-docusate  Take 1 tablet by mouth 2 (two) times daily.        Review of Systems  Unable to perform ROS: Dementia    Immunization History  Administered Date(s) Administered  . Influenza-Unspecified 12/04/2014  . PPD Test 10/28/2014   Pertinent  Health Maintenance Due  Topic Date Due  . FOOT EXAM  09/22/1939  . OPHTHALMOLOGY EXAM  09/22/1939  . URINE MICROALBUMIN  09/22/1939  . HEMOGLOBIN A1C  04/10/2015  . PNA vac Low Risk Adult (1 of 2 - PCV13) 03/27/2016 (Originally 09/22/1994)  . DEXA SCAN  03/26/2023 (Originally 09/22/1994)  . INFLUENZA VACCINE  09/21/2015   No flowsheet data found. Functional Status Survey:    Filed Vitals:   04/23/15 1549  BP: 128/62  Pulse: 88  Temp: 98.6 F (37 C)  Resp: 18  Weight: 106 lb 6.4 oz (48.263 kg)  SpO2: 98%   Body mass index is 17.18 kg/(m^2). Physical Exam  Constitutional: She appears well-developed.  Frail Elderly in no acute distress.   HENT:  Head: Normocephalic.  Mouth/Throat: Oropharynx is clear and moist.  Eyes: Conjunctivae and EOM are normal. Pupils are equal, round, and reactive to light. Right eye exhibits no discharge. Left eye exhibits no discharge. No scleral icterus.  Neck: Neck supple. No JVD present. No tracheal deviation present.  Cardiovascular: Normal rate, regular rhythm, normal heart sounds and intact distal pulses.  Exam reveals no gallop and no friction rub.   No murmur heard. Pulmonary/Chest: Effort normal and breath sounds normal. No respiratory distress. She has no wheezes. She has no rales.  Abdominal: Soft. Bowel sounds are normal. She exhibits no distension and no mass. There is no tenderness.  There is no rebound and no guarding.  Genitourinary:  Wears incontinent depends.   Musculoskeletal: She exhibits no edema or tenderness.  Left LE stiffness and contracture.   Lymphadenopathy:    She has no cervical adenopathy.  Neurological: She is alert.  Skin: Skin is warm and dry. No rash noted. No erythema. No pallor.  Psychiatric: She has a normal mood and affect.  Labs reviewed:  Recent Labs  08/27/14 0214 08/28/14 0241  09/15/14 1504 01/11/15 02/23/15  NA 138 140  < > 139 141 141  K 3.8 3.7  < > 4.1 4.3 4.1  CL 106 105  --  104  --   --   CO2 25 27  --  23  --   --   GLUCOSE 116* 130*  --  188*  --   --   BUN 18 22*  < > 17 20 20   CREATININE 0.61 0.67  < > 0.72 0.6 0.6  CALCIUM 8.5* 8.4*  --  9.3  --   --   < > = values in this interval not displayed.  Recent Labs  08/25/14 1537 10/08/14 1210 01/11/15  AST  --  14 14  ALT  --  11 15  ALKPHOS  --  143* 145*  ALBUMIN 3.5  --   --     Recent Labs  08/25/14 0937  08/28/14 0241 08/29/14 0900  09/15/14 1504 01/11/15 02/23/15  WBC 13.8*  < > 9.6 8.1  < > 10.4 10.5 8.3  NEUTROABS 8.2*  --   --   --   --   --   --   --   HGB 12.5  < > 8.5* 7.9*  < > 12.4 13.1 11.8*  HCT 36.4  < > 25.2* 23.6*  < > 38.5 39 36  MCV 95.3  < > 95.1 95.5  --  98.2  --   --   PLT 239  < > 221 238  < > 499* 366 304  < > = values in this interval not displayed. Lab Results  Component Value Date   TSH 0.457 08/25/2014   Lab Results  Component Value Date   HGBA1C 6.8* 10/08/2014   Lab Results  Component Value Date   CHOL 198 10/08/2014   HDL 49 10/08/2014   LDLCALC 121 10/08/2014   TRIG 142 10/08/2014   CHOLHDL 3.9 08/26/2014    Significant Diagnostic Results in last 30 days:  No results found.  Assessment/Plan 1. Dementia, without behavioral disturbance Has completed antibiotics for recent PNA. No new behavioral issues reported. Continue on Namenda and Aricept. Continue to assist with ADL's. Fall and safety precautions.  Skin Care.    2. Essential hypertension B/p stable. Continue on Diltiazem 240 mg Capsule. Monitor BMP  3. Chronic atrial fibrillation (HCC) HR controlled on Eliquis 2.5 mg Tablet Bid. Continue to monitor.   4. Abnormality of gait Requires assistance with propelling wheelchair. Fall and safety precautions.     Family/ staff Communication: Reviewed plan of Care with patient's husband and facility Nurse.   Labs/tests ordered:  None

## 2015-04-28 LAB — CBC AND DIFFERENTIAL
HCT: 39 % (ref 36–46)
Hemoglobin: 12.5 g/dL (ref 12.0–16.0)
Platelets: 347 10*3/uL (ref 150–399)
WBC: 8.6 10^3/mL

## 2015-04-28 LAB — BASIC METABOLIC PANEL
BUN: 14 mg/dL (ref 4–21)
Creatinine: 0.5 mg/dL (ref 0.5–1.1)
Glucose: 160 mg/dL
Sodium: 140 mmol/L (ref 137–147)

## 2015-05-26 ENCOUNTER — Encounter: Payer: Self-pay | Admitting: Family

## 2015-05-26 ENCOUNTER — Non-Acute Institutional Stay (SKILLED_NURSING_FACILITY): Payer: Medicare Other | Admitting: Family

## 2015-05-26 DIAGNOSIS — I482 Chronic atrial fibrillation, unspecified: Secondary | ICD-10-CM

## 2015-05-26 DIAGNOSIS — R269 Unspecified abnormalities of gait and mobility: Secondary | ICD-10-CM | POA: Diagnosis not present

## 2015-05-26 DIAGNOSIS — F0390 Unspecified dementia without behavioral disturbance: Secondary | ICD-10-CM

## 2015-05-26 DIAGNOSIS — I10 Essential (primary) hypertension: Secondary | ICD-10-CM

## 2015-05-26 NOTE — Progress Notes (Signed)
Patient ID: Brittney Powell, female   DOB: [birth year], 82 y.o.   MRN: [id number]  Location:  [place] Room Number: [id number] Place of Service:  SNF (31) Provider:  Marlowe Sax, FNP-C   [name], MD  Patient Care Team: [name], MD as PCP - General (Family Medicine)  Extended Emergency Contact Information Primary Emergency Contact: Brayla, Rhoad Address: [place] [address] Johnnette Litter of [place] Phone: [phone] Relation: Spouse Secondary Emergency Contact: Wohlfarth,David Address: [address]          [place], [place] [address] Johnnette Litter of [place] Phone: [phone] Mobile Phone: [phone] Relation: Son  Code Status:  Full Code Goals of care: Advanced Directive information Advanced Directives 03/29/2015  Does patient have an advance directive? No  Type of Advance Directive -  Would patient like information on creating an advanced directive? No - patient declined information     Chief Complaint  Patient presents with  . Medical Management of Chronic Issues    Routine Visit     HPI:  Pt is a 82 y.o. female seen today at [facility]  for medical management of chronic diseases. She has a medical history of Type 2 DM, HTN, Hyperlipidemia, Afib, dementia among others. She is seen in her room today. HPI and ROS limited due to presence of dementia.Facility staff reports no concerns.    Past Medical History  Diagnosis Date  . DM (diabetes mellitus) ([place])   . HTN (hypertension)   . Hypercholesterolemia   . Depression   . Breast cancer ([place]) 1980    right  . Intrauterine pessary   . Wrist fracture 05/2011  . Osteoporosis   . Memory difficulty 05/15/2013  . Abnormality of gait 05/15/2013  . Dementia   . TIA (transient ischemic attack)    Past Surgical History  Procedure Laterality Date  . Mastectomy modified radical Right     30 years ago  . Intramedullary (im)  nail intertrochanteric Left 08/27/2014    Procedure: INTRAMEDULLARY (IM) NAIL INTERTROCHANTRIC;  Surgeon: Renette Butters, MD;  Location: [place];  Service: Orthopedics;  Laterality: Left;    Allergies  Allergen Reactions  . Penicillins Other (See Comments)    Unsure of reaction      Medication List       This list is accurate as of: 05/26/15  2:32 PM.  Always use your most recent med list.               acetaminophen 325 MG tablet  Commonly known as:  TYLENOL  Take 650 mg by mouth every 6 (six) hours as needed for moderate pain.     ALPRAZolam 0.25 MG tablet  Commonly known as:  XANAX  Take 0.25 mg by mouth daily as needed for anxiety.     apixaban 2.5 MG Tabs tablet  Commonly known as:  ELIQUIS  Take 1 tablet (2.5 mg total) by mouth 2 (two) times daily.     diltiazem 240 MG 24 hr capsule  Commonly known as:  DILACOR XR  Take 240 mg by mouth daily. For Afib     docusate sodium 100 MG capsule  Commonly known as:  COLACE  Take 1 capsule (100 mg total) by mouth 2 (two) times daily.     donepezil 10 MG tablet  Commonly known as:  ARICEPT  Take 1 tablet (10 mg total) by mouth  at bedtime.     ezetimibe 10 MG tablet  Commonly known as:  ZETIA  Take 10 mg by mouth daily.     glimepiride 2 MG tablet  Commonly known as:  AMARYL  Take 2 mg by mouth daily with breakfast.     HUMALOG 100 UNIT/ML injection  Generic drug:  insulin lispro  Sliding scale dosing per CBG results BID 201-250=3 units, 251-350= 5 units, >351=call MD     loratadine 10 MG tablet  Commonly known as:  CLARITIN  Take 10 mg by mouth daily.     memantine 28 MG Cp24 24 hr capsule  Commonly known as:  NAMENDA XR  Take 1 capsule (28 mg total) by mouth daily.     mirtazapine 15 MG tablet  Commonly known as:  REMERON  Take 15 mg by mouth at bedtime.     NUTRITIONAL SHAKE PO  Take by mouth. Mighty shake daily at lunch     ondansetron 4 MG tablet  Commonly known as:  ZOFRAN  Take 1 tablet (4 mg  total) by mouth every 8 (eight) hours as needed for nausea or vomiting.     PARoxetine 40 MG tablet  Commonly known as:  PAXIL  Take 40 mg by mouth daily.     polyethylene glycol packet  Commonly known as:  MIRALAX / GLYCOLAX  Take 17 g by mouth daily.     SENNA PLUS 8.6-50 MG tablet  Generic drug:  senna-docusate  Take 1 tablet by mouth 2 (two) times daily.        Review of Systems  Unable to perform ROS: Dementia    Immunization History  Administered Date(s) Administered  . Influenza-Unspecified 12/04/2014  . PPD Test 10/28/2014   Pertinent  Health Maintenance Due  Topic Date Due  . HEMOGLOBIN A1C  06/25/2015 (Originally 04/10/2015)  . URINE MICROALBUMIN  06/25/2015 (Originally 09/22/1937)  . PNA vac Low Risk Adult (1 of 2 - PCV13) 03/27/2016 (Originally 09/22/1994)  . DEXA SCAN  03/26/2023 (Originally 09/22/1994)  . INFLUENZA VACCINE  09/21/2015  . FOOT EXAM  03/09/2016  . OPHTHALMOLOGY EXAM  03/16/2016   No flowsheet data found. Functional Status Survey:    Filed Vitals:   05/26/15 1406  BP: 111/57  Pulse: 73  Temp: 98.6 F (37 C)  TempSrc: Oral  Height: 5\' 6"  (1.676 m)  Weight: 98 lb 9.6 oz (44.725 kg)  SpO2: 96%   Body mass index is 15.92 kg/(m^2). Physical Exam  Constitutional: She appears well-developed.  Frail Elderly in no acute distress.   HENT:  Head: Normocephalic.  Mouth/Throat: Oropharynx is clear and moist.  Eyes: Conjunctivae and EOM are normal. Pupils are equal, round, and reactive to light. Right eye exhibits no discharge. Left eye exhibits no discharge. No scleral icterus.  Neck: Neck supple. No JVD present. No tracheal deviation present.  Cardiovascular: Normal rate, regular rhythm, normal heart sounds and intact distal pulses.  Exam reveals no gallop and no friction rub.   No murmur heard. Pulmonary/Chest: Effort normal and breath sounds normal. No respiratory distress. She has no wheezes. She has no rales.  Abdominal: Soft. Bowel sounds  are normal. She exhibits no distension and no mass. There is no tenderness. There is no rebound and no guarding.  Genitourinary:  Wears incontinent depends.   Musculoskeletal: She exhibits no edema or tenderness.  Left LE stiffness and contracture. Uses wheelchair with assistance.   Lymphadenopathy:    She has no cervical adenopathy.  Neurological: She is  alert.  Skin: Skin is warm and dry. No rash noted. No erythema. No pallor.  Psychiatric: She has a normal mood and affect.    Labs reviewed:  Recent Labs  08/27/14 0214 08/28/14 0241  09/15/14 1504 01/11/15 02/23/15 04/28/15  NA 138 140  < > 139 141 141 140  K 3.8 3.7  < > 4.1 4.3 4.1  --   CL 106 105  --  104  --   --   --   CO2 25 27  --  23  --   --   --   GLUCOSE 116* 130*  --  188*  --   --   --   BUN 18 22*  < > 17 20 20 14   CREATININE 0.61 0.67  < > 0.72 0.6 0.6 0.5  CALCIUM 8.5* 8.4*  --  9.3  --   --   --   < > = values in this interval not displayed.  Recent Labs  08/25/14 1537 10/08/14 1210 01/11/15  AST  --  14 14  ALT  --  11 15  ALKPHOS  --  143* 145*  ALBUMIN 3.5  --   --     Recent Labs  08/25/14 0937  08/28/14 0241 08/29/14 0900  09/15/14 1504  02/23/15 04/07/15 04/28/15  WBC 13.8*  < > 9.6 8.1  < > 10.4  < > 8.3 11.8 8.6  NEUTROABS 8.2*  --   --   --   --   --   --   --   --   --   HGB 12.5  < > 8.5* 7.9*  < > 12.4  < > 11.8* 13.5 12.5  HCT 36.4  < > 25.2* 23.6*  < > 38.5  < > 36 40 39  MCV 95.3  < > 95.1 95.5  --  98.2  --   --   --   --   PLT 239  < > 221 238  < > 499*  < > 304 358 347  < > = values in this interval not displayed. Lab Results  Component Value Date   TSH 0.457 08/25/2014   Lab Results  Component Value Date   HGBA1C 6.8* 10/08/2014   Lab Results  Component Value Date   CHOL 198 10/08/2014   HDL 49 10/08/2014   LDLCALC 121 10/08/2014   TRIG 142 10/08/2014   CHOLHDL 3.9 08/26/2014    Significant Diagnostic Results in last 30 days:  No results  found.  Assessment/Plan Dementia No new behavioral issues reported. Continue on Namenda XR 28 mg capsule and Aricept 10 mg tablet. Continue to assist with ADL's.   Abnormal Gait  Requires assist with Wheelchair. Fall and safety precautions.   HTN B/p stable. Continue to monitor BMP.   Chronic Afib HR controlled. Continue Eliquis 2.5 mg Tablet and Diltiazem 240 mg capsule.     Family/ staff Communication: Reviewed plan of care with facility Nurse supervisor  Labs/tests ordered: None

## 2015-06-13 NOTE — Progress Notes (Signed)
Patient ID: Brittney Powell, female   DOB: 01-11-1928, 82 y.o.   MRN: [id number]  Location:  [place] of Service:  SNF (31) Provider: Dinah Ngetich FNP-C Carylon Perches, MD   [name], MD  Patient Care Team: [name], MD as PCP - General (Family Medicine)  Extended Emergency Contact Information Primary Emergency Contact: Amaya, Vangorder Address: [address] [address] [place] Phone: [phone] Relation: Spouse Secondary Emergency Contact: Enge,David Address: [address]          [place],  [address] [place] Phone: [phone] Mobile Phone: [phone] Relation: Son  Code Status: Full Code  Goals of care: Advanced Directive information Advanced Directives 03/29/2015  Does patient have an advance directive? No  Type of Advance Directive -  Would patient like information on creating an advanced directive? No - patient declined information     Chief Complaint  Patient presents with  . Acute Visit    HPI:  Pt is a 82 y.o. female seen today at [facility]  for an acute visit for abnormal Chest X-ray result. She has a medical history of Type 2 DM, HTN, Depression, Breast CA, TIA,Dementia among others. She is seen in her room today. Her portable CXR result showed left basilar density pneumonia.Patient's HPI and ROS limited due to dementia. Facility staff reports non-productive cough. Afebrile.   Past Medical History  Diagnosis Date  . DM (diabetes mellitus) ([place])   . HTN (hypertension)   . Hypercholesterolemia   . Depression   . Breast cancer ([place]) 1980    right  . Intrauterine pessary   . Wrist fracture 05/2011  . Osteoporosis   . Memory difficulty 05/15/2013  . Abnormality of gait 05/15/2013  . Dementia   . TIA (transient ischemic attack)    Past Surgical History  Procedure Laterality Date  . Mastectomy modified radical Right     30  years ago  . Intramedullary (im) nail intertrochanteric Left 08/27/2014    Procedure: INTRAMEDULLARY (IM) NAIL INTERTROCHANTRIC;  Surgeon: Renette Butters, MD;  Location: [place];  Service: Orthopedics;  Laterality: Left;    Allergies  Allergen Reactions  . Penicillins Other (See Comments)    Unsure of reaction      Medication List       This list is accurate as of: 04/09/15 11:59 PM.  Always use your most recent med list.               acetaminophen 325 MG tablet  Commonly known as:  TYLENOL  Take 650 mg by mouth every 6 (six) hours as needed for moderate pain.     ALPRAZolam 0.25 MG tablet  Commonly known as:  XANAX  Take 0.25 mg by mouth daily as needed for anxiety.     ALPRAZolam 0.25 MG tablet  Commonly known as:  XANAX  Take one tablet by mouth once daily for anxiety     apixaban 2.5 MG Tabs tablet  Commonly known as:  ELIQUIS  Take 1 tablet (2.5 mg total) by mouth 2 (two) times daily.     diltiazem 240 MG 24 hr capsule  Commonly known as:  DILACOR XR  Take 240 mg by mouth daily. For Afib     docusate sodium 100 MG capsule  Commonly known as:  COLACE  Take 1 capsule (100 mg total) by mouth 2 (two)  times daily.     donepezil 10 MG tablet  Commonly known as:  ARICEPT  Take 1 tablet (10 mg total) by mouth at bedtime.     ezetimibe 10 MG tablet  Commonly known as:  ZETIA  Take 10 mg by mouth daily.     glimepiride 2 MG tablet  Commonly known as:  AMARYL  Take 2 mg by mouth daily with breakfast.     HUMALOG 100 UNIT/ML injection  Generic drug:  insulin lispro  Sliding scale dosing per CBG results BID 201-250=3 units, 251-350= 5 units, >351=call MD     HYDROcodone-acetaminophen 5-325 MG tablet  Commonly known as:  NORCO/VICODIN  1 by mouth every 6 hours as need for moderate pain, 2 by mouth as needed for severe pain DO NOT EXCEED 4GM OF APAP IN 24 HOURS FROM ALL SOURCES     levofloxacin 750 MG tablet  Commonly known as:  LEVAQUIN  Take 1 tablet (750 mg  total) by mouth daily.     loratadine 10 MG tablet  Commonly known as:  CLARITIN  Take 10 mg by mouth daily.     memantine 28 MG Cp24 24 hr capsule  Commonly known as:  NAMENDA XR  Take 1 capsule (28 mg total) by mouth daily.     mirtazapine 15 MG tablet  Commonly known as:  REMERON  Take 15 mg by mouth at bedtime.     NUTRITIONAL SHAKE PO  Take by mouth. Mighty shake daily at lunch     ondansetron 4 MG tablet  Commonly known as:  ZOFRAN  Take 1 tablet (4 mg total) by mouth every 8 (eight) hours as needed for nausea or vomiting.     PARoxetine 20 MG tablet  Commonly known as:  PAXIL  Take 40 mg by mouth daily.     PARoxetine 40 MG tablet  Commonly known as:  PAXIL  Take 40 mg by mouth daily.     polyethylene glycol packet  Commonly known as:  MIRALAX / GLYCOLAX  Take 17 g by mouth daily.     saccharomyces boulardii 250 MG capsule  Commonly known as:  FLORASTOR  Take 1 capsule (250 mg total) by mouth 2 (two) times daily.     SENNA PLUS 8.6-50 MG tablet  Generic drug:  senna-docusate  Take 1 tablet by mouth 2 (two) times daily.        Review of Systems  Unable to perform ROS: Dementia    Immunization History  Administered Date(s) Administered  . Influenza-Unspecified 12/04/2014  . PPD Test 10/28/2014   Pertinent  Health Maintenance Due  Topic Date Due  . HEMOGLOBIN A1C  06/25/2015 (Originally 04/10/2015)  . URINE MICROALBUMIN  06/25/2015 (Originally 09/22/1939)  . PNA vac Low Risk Adult (1 of 2 - PCV13) 03/27/2016 (Originally 09/22/1994)  . DEXA SCAN  03/26/2023 (Originally 09/22/1994)  . INFLUENZA VACCINE  09/21/2015  . FOOT EXAM  03/09/2016  . OPHTHALMOLOGY EXAM  03/16/2016   No flowsheet data found. Functional Status Survey:    Filed Vitals:   04/09/15 1130  BP: 118/74  Pulse: 69  Temp: 98.1 F (36.7 C)  Resp: 18  Height: 5\' 6"  (1.676 m)  Weight: 108 lb 3.2 oz (49.079 kg)  SpO2: 98%   Body mass index is 17.47 kg/(m^2). Physical Exam    Constitutional: She appears well-developed and well-nourished. No distress.  HENT:  Head: Normocephalic.  Mouth/Throat: Oropharynx is clear and moist.  Eyes: Conjunctivae and EOM are normal. Pupils  are equal, round, and reactive to light. Right eye exhibits no discharge. Left eye exhibits no discharge. No scleral icterus.  Neck: Normal range of motion. No JVD present.  Cardiovascular: Normal rate, regular rhythm, normal heart sounds and intact distal pulses.  Exam reveals no gallop and no friction rub.   No murmur heard. Pulmonary/Chest: Effort normal. No respiratory distress. She has no wheezes.  Bilateral upper lobes Crackles.   Abdominal: Soft. Bowel sounds are normal. She exhibits no distension. There is no tenderness. There is no rebound and no guarding.  Lymphadenopathy:    She has no cervical adenopathy.  Neurological: She is alert.  Skin: Skin is warm and dry. No rash noted. No erythema. No pallor.  Psychiatric: She has a normal mood and affect.    Labs reviewed:  Recent Labs  08/27/14 0214 08/28/14 0241  09/15/14 1504 01/11/15 02/23/15 04/28/15  NA 138 140  < > 139 141 141 140  K 3.8 3.7  < > 4.1 4.3 4.1  --   CL 106 105  --  104  --   --   --   CO2 25 27  --  23  --   --   --   GLUCOSE 116* 130*  --  188*  --   --   --   BUN 18 22*  < > 17 20 20 14   CREATININE 0.61 0.67  < > 0.72 0.6 0.6 0.5  CALCIUM 8.5* 8.4*  --  9.3  --   --   --   < > = values in this interval not displayed.  Recent Labs  08/25/14 1537 10/08/14 1210 01/11/15  AST  --  14 14  ALT  --  11 15  ALKPHOS  --  143* 145*  ALBUMIN 3.5  --   --     Recent Labs  08/25/14 0937  08/28/14 0241 08/29/14 0900  09/15/14 1504  02/23/15 04/07/15 04/28/15  WBC 13.8*  < > 9.6 8.1  < > 10.4  < > 8.3 11.8 8.6  NEUTROABS 8.2*  --   --   --   --   --   --   --   --   --   HGB 12.5  < > 8.5* 7.9*  < > 12.4  < > 11.8* 13.5 12.5  HCT 36.4  < > 25.2* 23.6*  < > 38.5  < > 36 40 39  MCV 95.3  < > 95.1 95.5  --   98.2  --   --   --   --   PLT 239  < > 221 238  < > 499*  < > 304 358 347  < > = values in this interval not displayed. Lab Results  Component Value Date   TSH 0.457 08/25/2014   Lab Results  Component Value Date   HGBA1C 6.8* 10/08/2014   Lab Results  Component Value Date   CHOL 198 10/08/2014   HDL 49 10/08/2014   LDLCALC 121 10/08/2014   TRIG 142 10/08/2014   CHOLHDL 3.9 08/26/2014    Significant Diagnostic Results in last 30 days:  No results found.  Assessment/Plan 1. CAP (community acquired pneumonia) Afebrile. CXR result showed left basilar density pneumonia (04/09/2015) Exam findings positive for bilateral upper lobes crackles. Start Levaquin 750 mg Tablet daily X 5 days. Monitor VS.  2. Cough  Non-productive. Continue PRN cough syrup.    Family/ staff Communication: Reviewed plan of care with facility Nurse.  Labs/tests ordered:  None

## 2015-06-21 ENCOUNTER — Encounter: Payer: Self-pay | Admitting: Family

## 2015-06-21 ENCOUNTER — Non-Acute Institutional Stay (SKILLED_NURSING_FACILITY): Payer: Medicare Other | Admitting: Family

## 2015-06-21 DIAGNOSIS — E785 Hyperlipidemia, unspecified: Secondary | ICD-10-CM | POA: Diagnosis not present

## 2015-06-21 DIAGNOSIS — F411 Generalized anxiety disorder: Secondary | ICD-10-CM | POA: Diagnosis not present

## 2015-06-21 DIAGNOSIS — F0390 Unspecified dementia without behavioral disturbance: Secondary | ICD-10-CM | POA: Diagnosis not present

## 2015-06-21 DIAGNOSIS — E118 Type 2 diabetes mellitus with unspecified complications: Secondary | ICD-10-CM

## 2015-06-21 DIAGNOSIS — I482 Chronic atrial fibrillation, unspecified: Secondary | ICD-10-CM

## 2015-06-21 DIAGNOSIS — I10 Essential (primary) hypertension: Secondary | ICD-10-CM

## 2015-06-21 NOTE — Progress Notes (Signed)
Location:  [place] Number: [place] of Service:  SNF (31) Provider:  Marlowe Sax, FNP-C  Blanchie Serve, MD  Patient Care Team: Blanchie Serve, MD as PCP - General (Internal Medicine)  Extended Emergency Contact Information Primary Emergency Contact: Wessie, Rowton Address: [place] [address] [place] Phone: [phone] Relation: Spouse Secondary Emergency Contact: Mussa,David Address: [address]          [place], [place] [address] [place] Phone: [phone] Mobile Phone: [phone] Relation: Son  Code Status:  Full Code  Goals of care: Advanced Directive information Advanced Directives 03/29/2015  Does patient have an advance directive? No  Type of Advance Directive -  Would patient like information on creating an advanced directive? No - patient declined information     Chief Complaint  Patient presents with  . Medical Management of Chronic Issues    Routine Visit    HPI:  Pt is a 82 y.o. female seen today at [facility] for medical management of chronic diseases. She has a medical history of HTN, Type 2 DM, hyperlipidemia, Dementia, Depression among other condition. She is seen in her room today. She continues to require assistance with ADL's. No skin break down and no recent fall episodes reported. Facility staff re[ports no new concerns. HPI and ROS limited due to presence of dementia.     Past Medical History  Diagnosis Date  . DM (diabetes mellitus) ([place])   . HTN (hypertension)   . Hypercholesterolemia   . Depression   . Breast cancer ([place]) 1980    right  . Intrauterine pessary   . Wrist fracture 05/2011  . Osteoporosis   . Memory difficulty 05/15/2013  . Abnormality of gait 05/15/2013  . Dementia   . TIA (transient ischemic attack)    Past Surgical History  Procedure Laterality Date  . Mastectomy modified radical  Right     30 years ago  . Intramedullary (im) nail intertrochanteric Left 08/27/2014    Procedure: INTRAMEDULLARY (IM) NAIL INTERTROCHANTRIC;  Surgeon: Renette Butters, MD;  Location: [place];  Service: Orthopedics;  Laterality: Left;    Allergies  Allergen Reactions  . Penicillins Other (See Comments)    Unsure of reaction      Medication List       This list is accurate as of: 06/21/15 11:59 PM.  Always use your most recent med list.               acetaminophen 325 MG tablet  Commonly known as:  TYLENOL  Take 650 mg by mouth every 6 (six) hours as needed for moderate pain.     ALPRAZolam 0.25 MG tablet  Commonly known as:  XANAX  Take 0.25 mg by mouth daily as needed for anxiety.     apixaban 2.5 MG Tabs tablet  Commonly known as:  ELIQUIS  Take 1 tablet (2.5 mg total) by mouth 2 (two) times daily.     DECUBI-VITE PO  Take 1 tablet by mouth daily. For wound healing     diltiazem 240 MG 24 hr capsule  Commonly known as:  DILACOR XR  Take 240 mg by mouth daily. For Afib     docusate sodium 100 MG capsule  Commonly known as:  COLACE  Take 1 capsule (100 mg total) by mouth 2 (two) times daily.  donepezil 10 MG tablet  Commonly known as:  ARICEPT  Take 1 tablet (10 mg total) by mouth at bedtime.     ezetimibe 10 MG tablet  Commonly known as:  ZETIA  Take 10 mg by mouth daily.     glimepiride 2 MG tablet  Commonly known as:  AMARYL  Take 2 mg by mouth daily with breakfast.     HUMALOG 100 UNIT/ML injection  Generic drug:  insulin lispro  Sliding scale dosing per CBG results BID 201-250=3 units, 251-350= 5 units, >351=call MD     loratadine 10 MG tablet  Commonly known as:  CLARITIN  Take 10 mg by mouth daily.     memantine 28 MG Cp24 24 hr capsule  Commonly known as:  NAMENDA XR  Take 1 capsule (28 mg total) by mouth daily.     mirtazapine 15 MG tablet  Commonly known as:  REMERON  Take 15 mg by mouth at bedtime.     NUTRITIONAL SHAKE PO  Provide  house shake by mouth twice a day for supplement     nystatin cream  Commonly known as:  MYCOSTATIN  Apply nystatin cream twice daily to severe rash to the vulvar area     ondansetron 4 MG tablet  Commonly known as:  ZOFRAN  Take 1 tablet (4 mg total) by mouth every 8 (eight) hours as needed for nausea or vomiting.     PARoxetine 40 MG tablet  Commonly known as:  PAXIL  Take 40 mg by mouth daily.     polyethylene glycol packet  Commonly known as:  MIRALAX / GLYCOLAX  Take 17 g by mouth daily. Hold for loose stools     SENNA PLUS 8.6-50 MG tablet  Generic drug:  senna-docusate  Take 1 tablet by mouth 2 (two) times daily.     UNABLE TO FIND  Med Name: Med Pass  16ml three times a day  D/t weight loss        Review of Systems  Unable to perform ROS: Dementia    Immunization History  Administered Date(s) Administered  . Influenza-Unspecified 12/04/2014  . PPD Test 10/28/2014   Pertinent  Health Maintenance Due  Topic Date Due  . HEMOGLOBIN A1C  02/21/2016 (Originally 04/10/2015)  . URINE MICROALBUMIN  02/21/2016 (Originally 09/22/1939)  . PNA vac Low Risk Adult (1 of 2 - PCV13) 03/27/2016 (Originally 09/22/1994)  . DEXA SCAN  03/26/2023 (Originally 09/22/1994)  . INFLUENZA VACCINE  09/21/2015  . FOOT EXAM  03/09/2016  . OPHTHALMOLOGY EXAM  03/16/2016   No flowsheet data found. Functional Status Survey:    Filed Vitals:   06/21/15 1521  BP: 115/52  Pulse: 72  Temp: 98.9 F (37.2 C)  Resp: 20  Height: 5\' 6"  (1.676 m)  Weight: 98 lb 6.4 oz (44.634 kg)  SpO2: 98%   Body mass index is 15.89 kg/(m^2). Physical Exam  Constitutional: She appears well-developed.  Frail Elderly in no acute distress.   HENT:  Head: Normocephalic.  Mouth/Throat: Oropharynx is clear and moist.  Eyes: Conjunctivae and EOM are normal. Pupils are equal, round, and reactive to light. Right eye exhibits no discharge. Left eye exhibits no discharge. No scleral icterus.  Neck: Neck supple. No  JVD present. No tracheal deviation present.  Cardiovascular: Normal rate, regular rhythm, normal heart sounds and intact distal pulses.  Exam reveals no gallop and no friction rub.   No murmur heard. Pulmonary/Chest: Effort normal and breath sounds normal. No respiratory distress. She  has no wheezes. She has no rales.  Abdominal: Soft. Bowel sounds are normal. She exhibits no distension and no mass. There is no tenderness. There is no rebound and no guarding.  Genitourinary:   incontinent for both bowel and bladder.   Musculoskeletal: She exhibits no edema or tenderness.  Left LE stiffness and contracture noted. wheelchair bound.   Lymphadenopathy:    She has no cervical adenopathy.  Neurological: She is alert.  Skin: Skin is warm and dry. No rash noted. No erythema. No pallor.  Psychiatric: She has a normal mood and affect.    Labs reviewed:  Recent Labs  08/28/14 0241  09/15/14 1504 01/11/15 02/23/15 04/28/15  NA 140  < > 139 141 141 140  K 3.7  < > 4.1 4.3 4.1  --   CL 105  --  104  --   --   --   CO2 27  --  23  --   --   --   GLUCOSE 130*  --  188*  --   --   --   BUN 22*  < > 17 20 20 14   CREATININE 0.67  < > 0.72 0.6 0.6 0.5  CALCIUM 8.4*  --  9.3  --   --   --   < > = values in this interval not displayed.  Recent Labs  10/08/14 1210 01/11/15  AST 14 14  ALT 11 15  ALKPHOS 143* 145*    Recent Labs  08/28/14 0241 08/29/14 0900  09/15/14 1504  02/23/15 04/07/15 04/28/15  WBC 9.6 8.1  < > 10.4  < > 8.3 11.8 8.6  HGB 8.5* 7.9*  < > 12.4  < > 11.8* 13.5 12.5  HCT 25.2* 23.6*  < > 38.5  < > 36 40 39  MCV 95.1 95.5  --  98.2  --   --   --   --   PLT 221 238  < > 499*  < > 304 358 347  < > = values in this interval not displayed. Lab Results  Component Value Date   TSH 0.457 08/25/2014   Lab Results  Component Value Date   HGBA1C 6.8* 10/08/2014   Lab Results  Component Value Date   CHOL 198 10/08/2014   HDL 49 10/08/2014   LDLCALC 121 10/08/2014   TRIG  142 10/08/2014   CHOLHDL 3.9 08/26/2014    Significant Diagnostic Results in last 30 days:  No results found.  Assessment/Plan HTN B/p stable. Continue on Diltiazem 240 mg 24 HR capsule. Monitor BMP   Afib HR controlled. Continue on Eliquis 2.5 mg Tablet   Type 2 DM CBG's stable. Continue on Humalog SSI. Monitor Hgb A1C   Dementia No new behavioral changes.Continue on Aricept 10 mg Tablet and Namenda XR 28 mg Capsule. Continue to assist with ADL's. Fall and safety precautions.    Hyperlipidemia  Continue on Zetia 10 mg Tablet.   Anxiety  Stable on Alprazolam. Continue to monitor.   Depression  No mood changes. Continue on Paxil 40 mg Tablet .    Family/ staff Communication: Reviewed plan of care with facility Nurse supervisor.  Labs/tests ordered:  None

## 2015-07-22 ENCOUNTER — Non-Acute Institutional Stay (SKILLED_NURSING_FACILITY): Payer: Medicare Other | Admitting: Internal Medicine

## 2015-07-22 ENCOUNTER — Encounter: Payer: Self-pay | Admitting: Internal Medicine

## 2015-07-22 DIAGNOSIS — E785 Hyperlipidemia, unspecified: Secondary | ICD-10-CM | POA: Insufficient documentation

## 2015-07-22 DIAGNOSIS — F0390 Unspecified dementia without behavioral disturbance: Secondary | ICD-10-CM

## 2015-07-22 DIAGNOSIS — E119 Type 2 diabetes mellitus without complications: Secondary | ICD-10-CM | POA: Insufficient documentation

## 2015-07-22 DIAGNOSIS — E46 Unspecified protein-calorie malnutrition: Secondary | ICD-10-CM | POA: Insufficient documentation

## 2015-07-22 DIAGNOSIS — I482 Chronic atrial fibrillation, unspecified: Secondary | ICD-10-CM

## 2015-07-22 DIAGNOSIS — E118 Type 2 diabetes mellitus with unspecified complications: Secondary | ICD-10-CM | POA: Diagnosis not present

## 2015-07-22 NOTE — Progress Notes (Signed)
Patient ID: Brittney Powell, female   DOB: 1927-04-21, 82 y.o.   MRN: [id number]     Facility: [facility]    PCP: [name], MD  Code Status: full code  Allergies  Allergen Reactions  . Penicillins Other (See Comments)    Unsure of reaction    Chief Complaint  Patient presents with  . Medical Management of Chronic Issues    Routine Visit     HPI:  82 y.o. year old patient is seen for routine visit. Her husband is at bedside. She has dementia which limits her history taking. She denies any concerns this visit. She needs assistance with her ADLs and is under total care. She is getting pressure ulcer treatment. No fall reported. No acute behavior changes reported. She is working with restorative team.   Review of Systems:  Constitutional: Negative for fever HENT: Negative for headache, congestion Eyes: Negative for blurred vision, double vision and discharge.  Respiratory: Negative for cough, shortness of breath and wheezing.   Cardiovascular: Negative for chest pain, palpitations, leg swelling.  Gastrointestinal: Negative for heartburn, nausea, vomiting, abdominal pain Genitourinary: Negative for dysuria Musculoskeletal: Negative for back pain Skin: Negative for itching, rash.  Neurological: Negative for dizziness    Past Medical History  Diagnosis Date  . DM (diabetes mellitus) ([place])   . HTN (hypertension)   . Hypercholesterolemia   . Depression   . Breast cancer ([place]) 1980    right  . Intrauterine pessary   . Wrist fracture 05/2011  . Osteoporosis   . Memory difficulty 05/15/2013  . Abnormality of gait 05/15/2013  . Dementia   . TIA (transient ischemic attack)     Medications: Patient's Medications  New Prescriptions   No medications on file  Previous Medications   ACETAMINOPHEN (TYLENOL) 325 MG TABLET    Take 650 mg by mouth every 6 (six) hours as needed for moderate pain.   ALPRAZOLAM (XANAX) 0.25 MG TABLET    Take 0.25 mg  by mouth daily as needed for anxiety.   APIXABAN (ELIQUIS) 2.5 MG TABS TABLET    Take 1 tablet (2.5 mg total) by mouth 2 (two) times daily.   DILTIAZEM (DILACOR XR) 240 MG 24 HR CAPSULE    Take 240 mg by mouth daily. For Afib   DOCUSATE SODIUM (COLACE) 100 MG CAPSULE    Take 1 capsule (100 mg total) by mouth 2 (two) times daily.   DONEPEZIL (ARICEPT) 10 MG TABLET    Take 1 tablet (10 mg total) by mouth at bedtime.   EZETIMIBE (ZETIA) 10 MG TABLET    Take 10 mg by mouth daily.    GLIMEPIRIDE (AMARYL) 2 MG TABLET    Take 2 mg by mouth daily with breakfast.   INSULIN LISPRO (HUMALOG) 100 UNIT/ML INJECTION    Sliding scale dosing per CBG results BID 201-250=3 units, 251-350= 5 units, >351=call MD   LORATADINE (CLARITIN) 10 MG TABLET    Take 10 mg by mouth daily.   MEMANTINE (NAMENDA XR) 28 MG CP24 24 HR CAPSULE    Take 1 capsule (28 mg total) by mouth daily.   MIRTAZAPINE (REMERON) 15 MG TABLET    Take 15 mg by mouth at bedtime.   MULTIPLE VITAMINS-MINERALS (DECUBI-VITE PO)    Take 1 tablet by mouth daily. For wound healing   NUTRITIONAL SUPPLEMENTS (NUTRITIONAL SHAKE PO)    Provide house shake by mouth twice a day for supplement   ONDANSETRON (ZOFRAN) 4 MG TABLET  Take 1 tablet (4 mg total) by mouth every 8 (eight) hours as needed for nausea or vomiting.   PAROXETINE (PAXIL) 40 MG TABLET    Take 40 mg by mouth daily.   POLYETHYLENE GLYCOL (MIRALAX / GLYCOLAX) PACKET    Take 17 g by mouth daily. Hold for loose stools   SENNA-DOCUSATE (SENNA PLUS) 8.6-50 MG TABLET    Take 1 tablet by mouth 2 (two) times daily.   UNABLE TO FIND    Med Name: Med Pass  170ml three times a day  D/t weight loss  Modified Medications   No medications on file  Discontinued Medications   NYSTATIN CREAM (MYCOSTATIN)    Apply nystatin cream twice daily to severe rash to the vulvar area     Physical Exam: Filed Vitals:   07/22/15 1414  BP: 119/75  Pulse: 95  Temp: 98 F (36.7 C)  TempSrc: Oral  Resp: 16  Height:  5\' 5"  (1.651 m)  Weight: 97 lb 11.2 oz (44.316 kg)  SpO2: 98%  Body mass index is 16.26 kg/(m^2).  Wt Readings from Last 3 Encounters:  07/22/15 97 lb 11.2 oz (44.316 kg)  06/21/15 98 lb 6.4 oz (44.634 kg)  05/26/15 98 lb 9.6 oz (44.725 kg)    General- elderly female, thin built, frail, in no acute distress Head- normocephalic, atraumatic Throat- moist mucus membrane Eyes- no pallor, no icterus, no discharge, normal conjunctiva, normal sclera Neck- no cervical lymphadenopathy Cardiovascular- irregular heart rate, no murmurs Respiratory- bilateral clear to auscultation, no wheeze, no rhonchi, no crackles, no use of accessory muscles Abdomen- bowel sounds present, soft, non tender Musculoskeletal- able to move all 4 extremities, contracture to left lower leg, has heel floaters to both heels, has a brace to left leg  Skin- stage 2 pressure ulcer to left inner buttock Neurological- alert and oriented to person only Psychiatry- normal affect   Labs reviewed: Basic Metabolic Panel:  Recent Labs  08/27/14 0214 08/28/14 0241  09/15/14 1504 01/11/15 02/23/15 04/28/15  NA 138 140  < > 139 141 141 140  K 3.8 3.7  < > 4.1 4.3 4.1  --   CL 106 105  --  104  --   --   --   CO2 25 27  --  23  --   --   --   GLUCOSE 116* 130*  --  188*  --   --   --   BUN 18 22*  < > 17 20 20 14   CREATININE 0.61 0.67  < > 0.72 0.6 0.6 0.5  CALCIUM 8.5* 8.4*  --  9.3  --   --   --   < > = values in this interval not displayed. Liver Function Tests:  Recent Labs  08/25/14 1537 10/08/14 1210 01/11/15  AST  --  14 14  ALT  --  11 15  ALKPHOS  --  143* 145*  ALBUMIN 3.5  --   --    No results for input(s): LIPASE, AMYLASE in the last 8760 hours. No results for input(s): AMMONIA in the last 8760 hours. CBC:  Recent Labs  08/25/14 0937  08/28/14 0241 08/29/14 0900  09/15/14 1504  02/23/15 04/07/15 04/28/15  WBC 13.8*  < > 9.6 8.1  < > 10.4  < > 8.3 11.8 8.6  NEUTROABS 8.2*  --   --   --   --    --   --   --   --   --  HGB 12.5  < > 8.5* 7.9*  < > 12.4  < > 11.8* 13.5 12.5  HCT 36.4  < > 25.2* 23.6*  < > 38.5  < > 36 40 39  MCV 95.3  < > 95.1 95.5  --  98.2  --   --   --   --   PLT 239  < > 221 238  < > 499*  < > 304 358 347  < > = values in this interval not displayed.    Lab Results  Component Value Date   HGBA1C 6.8* 10/08/2014   Lab Results  Component Value Date   TSH 0.457 08/25/2014      Assessment/Plan  Protein calorie malnutrition Encourage po intake. On pureed diet with fortified food for now. Monitor weight. Continue remeron. Continue multivitamin  afib has controlled heart rate. continue diltiazem CD 240 mg daily with eliquis 2.5 mg bid for anticoagulation and monitor  Diabetes mellitus type 2 Lab Results  Component Value Date   HGBA1C 6.8* 10/08/2014   No recent a1c for review. Check a1c and lipid panel. Continue amaryl 2 mg daily and zetia. Continue lispro SSI. Goal a1c <7. Check urine for microalbumin. uptodate with foot exam 06/21/15. Needs eye exam.  Hyperlipidemia Continue zetia for now, check lipid panel  Dementia without behavioral disturbance supportive care to be provided. Fall precautions. Skin care. Continue aricpet 10 mg daily and namenda xr 28 mg daily    Blanchie Serve, MD  [facility] [phone] (Monday-Friday 8 am - 5 pm) [phone] (afterhours)

## 2015-07-23 LAB — LIPID PANEL
CHOLESTEROL: 189 mg/dL (ref 0–200)
HDL: 56 mg/dL (ref 35–70)
LDL CALC: 101 mg/dL
Triglycerides: 159 mg/dL (ref 40–160)

## 2015-07-23 LAB — HEMOGLOBIN A1C: Hemoglobin A1C: 8.4

## 2015-07-24 LAB — MICROALBUMIN, URINE: Microalb, Ur: 44

## 2015-08-23 LAB — CBC AND DIFFERENTIAL
HCT: 40 % (ref 36–46)
Hemoglobin: 13 g/dL (ref 12.0–16.0)
PLATELETS: 357 10*3/uL (ref 150–399)
WBC: 10 10^3/mL

## 2015-08-23 LAB — BASIC METABOLIC PANEL
BUN: 19 mg/dL (ref 4–21)
BUN: 19 mg/dL (ref 4–21)
Creatinine: 0.5 mg/dL (ref 0.5–1.1)
Creatinine: 0.5 mg/dL (ref <=1.1)
GLUCOSE: 322 mg/dL
GLUCOSE: 322 mg/dL
Potassium: 3.8 mmol/L (ref 3.4–5.3)
Potassium: 3.8 mmol/L (ref 3.4–5.3)
SODIUM: 140 mmol/L (ref 137–147)
Sodium: 140 mmol/L (ref 137–147)

## 2015-09-01 ENCOUNTER — Encounter: Payer: Self-pay | Admitting: Family

## 2015-09-01 ENCOUNTER — Non-Acute Institutional Stay (SKILLED_NURSING_FACILITY): Payer: Medicare Other | Admitting: Family

## 2015-09-01 DIAGNOSIS — E118 Type 2 diabetes mellitus with unspecified complications: Secondary | ICD-10-CM | POA: Diagnosis not present

## 2015-09-01 DIAGNOSIS — I482 Chronic atrial fibrillation, unspecified: Secondary | ICD-10-CM

## 2015-09-01 DIAGNOSIS — E46 Unspecified protein-calorie malnutrition: Secondary | ICD-10-CM

## 2015-09-01 DIAGNOSIS — F0390 Unspecified dementia without behavioral disturbance: Secondary | ICD-10-CM | POA: Diagnosis not present

## 2015-09-01 DIAGNOSIS — I10 Essential (primary) hypertension: Secondary | ICD-10-CM

## 2015-09-01 NOTE — Progress Notes (Signed)
Location:   [facility] Room Number: [id number] Place of Service:  SNF (31) Provider: Marlowe Sax FNP-C    Patient Care Team: Blanchie Serve, MD as PCP - General (Internal Medicine)  Extended Emergency Contact Information Primary Emergency Contact: Joelene, Giel Address: [place] [address] [place] Phone: [phone] Relation: Spouse Secondary Emergency Contact: Riede,David Address: [address]          [place], [place] [address] [place] of [place] Phone: [phone] Mobile Phone: [phone] Relation: Son   Code Status:  Full Code Goals of care: Advanced Directive information Advanced Directives 09/01/2015  Does patient have an advance directive? No  Type of Advance Directive -  Would patient like information on creating an advanced directive? No - patient declined information     Chief Complaint  Patient presents with  . Medical Management of Chronic Issues    HPI:  Pt is a 82 y.o. female seen today at [facility]  for medical management of chronic diseases.  She has a medical history of HTN, Type 2 DM, Depression, Dementia among other conditions. She is seen in her room today with her husband at bedside. He states patient continues to refuse meals but prefers to drink protein supplements, milk and tea. Unable to obtain HPI and ROS due to advance dementia.    Past Medical History  Diagnosis Date  . DM (diabetes mellitus) ([place])   . HTN (hypertension)   . Hypercholesterolemia   . Depression   . Breast cancer ([place]) 1980    right  . Intrauterine pessary   . Wrist fracture 05/2011  . Osteoporosis   . Memory difficulty 05/15/2013  . Abnormality of gait 05/15/2013  . Dementia   . TIA (transient ischemic attack)    Past Surgical History  Procedure Laterality Date  . Mastectomy modified radical Right     30 years ago  . Intramedullary (im) nail  intertrochanteric Left 08/27/2014    Procedure: INTRAMEDULLARY (IM) NAIL INTERTROCHANTRIC;  Surgeon: Renette Butters, MD;  Location: [facility];  Service: Orthopedics;  Laterality: Left;    Allergies  Allergen Reactions  . Penicillins Other (See Comments)    Unsure of reaction      Medication List       This list is accurate as of: 09/01/15 12:25 PM.  Always use your most recent med list.               acetaminophen 325 MG tablet  Commonly known as:  TYLENOL  Take 650 mg by mouth every 6 (six) hours as needed for moderate pain.     ALPRAZolam 0.25 MG tablet  Commonly known as:  XANAX  Take 0.25 mg by mouth daily as needed for anxiety.     apixaban 2.5 MG Tabs tablet  Commonly known as:  ELIQUIS  Take 1 tablet (2.5 mg total) by mouth 2 (two) times daily.     DECUBI-VITE PO  Take 1 tablet by mouth daily. For wound healing     diltiazem 240 MG 24 hr capsule  Commonly known as:  DILACOR XR  Take 240 mg by mouth daily. For Afib     docusate sodium 100 MG capsule  Commonly known as:  COLACE  Take 1 capsule (100 mg total) by mouth 2 (two) times daily.     donepezil 10 MG tablet  Commonly known  as:  ARICEPT  Take 1 tablet (10 mg total) by mouth at bedtime.     ezetimibe 10 MG tablet  Commonly known as:  ZETIA  Take 10 mg by mouth daily. For hyperlipidemia     glimepiride 2 MG tablet  Commonly known as:  AMARYL  Take 2 mg by mouth daily with breakfast. For diabetes     HUMALOG 100 UNIT/ML injection  Generic drug:  insulin lispro  Sliding scale dosing per CBG results BID 201-250=3 units, 251-350= 5 units, >351=call MD     loratadine 10 MG tablet  Commonly known as:  CLARITIN  Take 10 mg by mouth daily.     memantine 28 MG Cp24 24 hr capsule  Commonly known as:  NAMENDA XR  Take 1 capsule (28 mg total) by mouth daily.     mirtazapine 15 MG tablet  Commonly known as:  REMERON  Take 15 mg by mouth at bedtime.     ondansetron 4 MG tablet  Commonly known as:  ZOFRAN    Take 1 tablet (4 mg total) by mouth every 8 (eight) hours as needed for nausea or vomiting.     PARoxetine 40 MG tablet  Commonly known as:  PAXIL  Take 40 mg by mouth daily. For depression     polyethylene glycol packet  Commonly known as:  MIRALAX / GLYCOLAX  Take 17 g by mouth daily. Hold for loose stools     SENNA PLUS 8.6-50 MG tablet  Generic drug:  senna-docusate  Take 1 tablet by mouth 2 (two) times daily.     UNABLE TO FIND  Med Name: Med Pass  145ml three times a day  D/t weight loss        Review of Systems  Unable to perform ROS: Dementia    Immunization History  Administered Date(s) Administered  . Influenza-Unspecified 12/04/2014  . PPD Test 10/28/2014   Pertinent  Health Maintenance Due  Topic Date Due  . HEMOGLOBIN A1C  02/21/2016 (Originally 04/10/2015)  . URINE MICROALBUMIN  02/21/2016 (Originally 09/22/1937)  . PNA vac Low Risk Adult (1 of 2 - PCV13) 03/27/2016 (Originally 09/22/1994)  . DEXA SCAN  03/26/2023 (Originally 09/22/1994)  . INFLUENZA VACCINE  09/21/2015  . FOOT EXAM  03/09/2016  . OPHTHALMOLOGY EXAM  03/16/2016   No flowsheet data found. Functional Status Survey:    Filed Vitals:   09/01/15 1152  BP: 119/77  Pulse: 78  Temp: 97.5 F (36.4 C)  Resp: 18  Height: 5\' 5"  (1.651 m)  Weight: 97 lb 11.2 oz (44.316 kg)  SpO2: 95%   Body mass index is 16.26 kg/(m^2). Physical Exam  Constitutional: She appears well-developed.  Frail Elderly in no acute distress.   HENT:  Head: Normocephalic.  Mouth/Throat: Oropharynx is clear and moist.  Eyes: Conjunctivae and EOM are normal. Pupils are equal, round, and reactive to light. Right eye exhibits no discharge. Left eye exhibits no discharge. No scleral icterus.  Neck: Neck supple. No JVD present. No tracheal deviation present.  Cardiovascular: Normal rate, regular rhythm, normal heart sounds and intact distal pulses.  Exam reveals no gallop and no friction rub.   No murmur  heard. Pulmonary/Chest: Effort normal and breath sounds normal. No respiratory distress. She has no wheezes. She has no rales.  Abdominal: Soft. Bowel sounds are normal. She exhibits no distension and no mass. There is no tenderness. There is no rebound and no guarding.  Genitourinary:   incontinent for both bowel and bladder.  Musculoskeletal: She exhibits no edema or tenderness.  Left LE stiffness and contracture noted. wheelchair bound.   Lymphadenopathy:    She has no cervical adenopathy.  Neurological: She is alert.  Follows simple commands but pleasantly confused   Skin: Skin is warm and dry. No rash noted. No erythema. No pallor.  Left ankle wound healed. Provolone boots on Bilateral lower extremities.   Psychiatric: She has a normal mood and affect.    Labs reviewed:  Recent Labs  09/15/14 1504 01/11/15 02/23/15 04/28/15 08/23/15  NA 139 141 141 140 140  K 4.1 4.3 4.1  --  3.8  CL 104  --   --   --   --   CO2 23  --   --   --   --   GLUCOSE 188*  --   --   --   --   BUN 17 20 20 14 19   CREATININE 0.72 0.6 0.6 0.5 0.5  CALCIUM 9.3  --   --   --   --     Recent Labs  10/08/14 1210 01/11/15  AST 14 14  ALT 11 15  ALKPHOS 143* 145*    Recent Labs  09/15/14 1504  04/07/15 04/28/15 08/23/15  WBC 10.4  < > 11.8 8.6 10.0  HGB 12.4  < > 13.5 12.5 13.0  HCT 38.5  < > 40 39 40  MCV 98.2  --   --   --   --   PLT 499*  < > 358 347 357  < > = values in this interval not displayed. Lab Results  Component Value Date   TSH 0.457 08/25/2014   Lab Results  Component Value Date   HGBA1C 6.8* 10/08/2014   Lab Results  Component Value Date   CHOL 189 07/23/2015   HDL 56 07/23/2015   LDLCALC 101 07/23/2015   TRIG 159 07/23/2015   CHOLHDL 3.9 08/26/2014    Significant Diagnostic Results in last 30 days:  No results found.  Assessment/Plan HTN B/p stable.   Afib  Continue on Eliquis 2.5 mg tablet daily.   Type 2 DM  Continue on glimepiride 2 mg tablet,  Humalog SSI. Last Hgb A1C 8.4 (07/23/2015).   Dementia  No new behavioral issues. Continues to requires assistance with ADL's. Refuses meals but drinks protein supplements.   Protein Malnutrition  Continue protein supplements.Monitor BMP   Family/ staff Communication: Reviewed plan of care with patient's husband and facility Nurse supervisor.  Labs/tests ordered: None

## 2015-09-27 ENCOUNTER — Ambulatory Visit (INDEPENDENT_AMBULATORY_CARE_PROVIDER_SITE_OTHER): Payer: Medicare Other | Admitting: Neurology

## 2015-09-27 ENCOUNTER — Encounter: Payer: Self-pay | Admitting: Neurology

## 2015-09-27 VITALS — BP 107/71 | HR 87 | Ht 66.0 in | Wt 97.0 lb

## 2015-09-27 DIAGNOSIS — R413 Other amnesia: Secondary | ICD-10-CM

## 2015-09-27 MED ORDER — DONEPEZIL HCL 5 MG PO TABS: 5.0000 mg | ORAL_TABLET | Freq: Every day | ORAL | 0 refills | Status: AC

## 2015-09-27 NOTE — Progress Notes (Signed)
Reason for visit: Alzheimer's disease  Brittney Powell is an 82 y.o. female  History of present illness:  Brittney Powell is an 82 year old right-handed white female with a history of Alzheimer's disease. The patient has continued to progress with her memory. The patient is also losing weight, over the last year her husband believes that she has lost 15-20 pounds. The patient is on Aricept and Namenda. She has some skin breakdown on one of the heels, she has had flexion contractures of the knees. She is not consistently swallowing her food, at times she will hold the food in the mouth. The patient seems to recognize her husband and her daughter. She may not recognize past friends coming in. She returns to this office for an evaluation.  Past Medical History:  Diagnosis Date  . Abnormality of gait 05/15/2013  . Breast cancer ([place]) 1980   right  . Dementia   . Depression   . DM (diabetes mellitus) ([place])   . HTN (hypertension)   . Hypercholesterolemia   . Intrauterine pessary   . Memory difficulty 05/15/2013  . Osteoporosis   . TIA (transient ischemic attack)   . Wrist fracture 05/2011    Past Surgical History:  Procedure Laterality Date  . INTRAMEDULLARY (IM) NAIL INTERTROCHANTERIC Left 08/27/2014   Procedure: INTRAMEDULLARY (IM) NAIL INTERTROCHANTRIC;  Surgeon: Renette Butters, MD;  Location: [facility];  Service: Orthopedics;  Laterality: Left;  [name] MASTECTOMY MODIFIED RADICAL Right    30 years ago    Family History  Problem Relation Age of Onset  . Diabetes Father   . Cancer Father     leukemia  . Heart attack Brother   . Dementia Maternal Aunt     Social history:  reports that she has never smoked. She has never used smokeless tobacco. She reports that she does not drink alcohol or use drugs.    Allergies  Allergen Reactions  . Penicillins Other (See Comments)    Unsure of reaction    Medications:  Prior to Admission medications   Medication Sig Start Date End Date Taking?  Authorizing Provider  acetaminophen (TYLENOL) 325 MG tablet Take 650 mg by mouth every 6 (six) hours as needed for moderate pain.   Yes Historical Provider, MD  ALPRAZolam Duanne Moron) 0.25 MG tablet Take 0.25 mg by mouth daily as needed for anxiety.   Yes Historical Provider, MD  apixaban (ELIQUIS) 2.5 MG TABS tablet Take 1 tablet (2.5 mg total) by mouth 2 (two) times daily. 08/29/14  Yes Kelvin Cellar, MD  diltiazem (DILACOR XR) 240 MG 24 hr capsule Take 240 mg by mouth daily. For Afib   Yes Historical Provider, MD  docusate sodium (COLACE) 100 MG capsule Take 1 capsule (100 mg total) by mouth 2 (two) times daily. 08/27/14  Yes Brittney Kelly, PA-C  donepezil (ARICEPT) 10 MG tablet Take 1 tablet (10 mg total) by mouth at bedtime. 09/16/13  Yes Kathrynn Ducking, MD  ezetimibe (ZETIA) 10 MG tablet Take 10 mg by mouth daily. For hyperlipidemia   Yes Historical Provider, MD  glimepiride (AMARYL) 2 MG tablet Take 2 mg by mouth daily with breakfast. For diabetes   Yes Historical Provider, MD  insulin lispro (HUMALOG) 100 UNIT/ML injection Sliding scale dosing per CBG results BID 201-250=3 units, 251-350= 5 units, >351=call MD   Yes Historical Provider, MD  loratadine (CLARITIN) 10 MG tablet Take 10 mg by mouth daily.   Yes Historical Provider, MD  memantine (NAMENDA XR) 28 MG CP24  24 hr capsule Take 1 capsule (28 mg total) by mouth daily. 05/12/14  Yes Kathrynn Ducking, MD  mirtazapine (REMERON) 15 MG tablet Take 15 mg by mouth at bedtime.   Yes Historical Provider, MD  Multiple Vitamins-Minerals (DECUBI-VITE PO) Take 1 tablet by mouth daily. For wound healing   Yes Historical Provider, MD  ondansetron (ZOFRAN) 4 MG tablet Take 1 tablet (4 mg total) by mouth every 8 (eight) hours as needed for nausea or vomiting. 08/27/14  Yes Brittney Claiborne Billings, PA-C  PARoxetine (PAXIL) 40 MG tablet Take 40 mg by mouth daily. For depression   Yes Historical Provider, MD  polyethylene glycol (MIRALAX / GLYCOLAX) packet Take 17 g by  mouth daily. Hold for loose stools   Yes Historical Provider, MD  senna-docusate (SENNA PLUS) 8.6-50 MG tablet Take 1 tablet by mouth 2 (two) times daily.   Yes Historical Provider, MD  UNABLE TO FIND Med Name: Med Pass  174ml three times a day  D/t weight loss   Yes Historical Provider, MD    ROS:  Out of a complete 14 system review of symptoms, the patient complains only of the following symptoms, and all other reviewed systems are negative.  Memory loss, speech difficulty Anxiety  Blood pressure 107/71, pulse 87, height 5\' 6"  (1.676 m), weight 97 lb (44 kg).  Physical Exam  General: The patient is alert and cooperative at the time of the examination.  Skin: No significant peripheral edema is noted.   Neurologic Exam  Mental status: The patient is alert and oriented x 1 at the time examination (oriented only to person). Mini-Mental Status Examination done today shows a total score 7/30.   Cranial nerves: Facial symmetry is present. Speech is normal, no aphasia or dysarthria is noted. Extraocular movements are full. Visual fields are full to threat.  Motor: The patient has good strength in the upper extremities. Increased motor tone is noted in both lower extremities.  Sensory examination: The patient appears to respond to deep stimulation on all fours.  Coordination: The patient has good finger-nose-finger bilaterally. The patient is not able to perform heel-to-shin on either side.  Gait and station: The patient could not be ambulated.  Reflexes: Deep tendon reflexes are symmetric.   Assessment/Plan:  1. Alzheimer's disease  2. Gait disorder  The patient is continued to progress with the dementia. The patient has had some weight loss, we will reduce the Aricept dose to 5 mg at night. If the weight loss continues, the Aricept can be discontinued. The patient will remain on Namenda. She will follow-up in about 8 months.  Jill Alexanders MD 09/27/2015 2:48 PM  [facility] [address] [place], [place] [address]  Phone [phone] Fax [phone]

## 2015-09-27 NOTE — Patient Instructions (Signed)
   We will reduce the Aricept to 5 mg at night.

## 2015-09-28 ENCOUNTER — Non-Acute Institutional Stay (SKILLED_NURSING_FACILITY): Payer: Medicare Other | Admitting: Family

## 2015-09-28 DIAGNOSIS — F0390 Unspecified dementia without behavioral disturbance: Secondary | ICD-10-CM | POA: Diagnosis not present

## 2015-09-28 DIAGNOSIS — I482 Chronic atrial fibrillation, unspecified: Secondary | ICD-10-CM

## 2015-09-28 DIAGNOSIS — I10 Essential (primary) hypertension: Secondary | ICD-10-CM

## 2015-09-28 DIAGNOSIS — E46 Unspecified protein-calorie malnutrition: Secondary | ICD-10-CM

## 2015-09-28 DIAGNOSIS — E785 Hyperlipidemia, unspecified: Secondary | ICD-10-CM

## 2015-09-28 DIAGNOSIS — E118 Type 2 diabetes mellitus with unspecified complications: Secondary | ICD-10-CM | POA: Diagnosis not present

## 2015-09-28 NOTE — Progress Notes (Signed)
Location:   [place]:  SNF (31) Provider:  Dinah Ngetich FNP-C   Blanchie Serve, MD  Patient Care Team: Blanchie Serve, MD as PCP - General (Internal Medicine)  Extended Emergency Contact Information Primary Emergency Contact: Melva, Bamba Address: [place] [address] [place] Phone: [phone] Relation: Spouse Secondary Emergency Contact: Friddle,David Address: [address]          [place], [place] [address] [place] of [place] Phone: [phone] Mobile Phone: [phone] Relation: Son  Code Status:  Full Code  Goals of care: Advanced Directive information Advanced Directives 09/01/2015  Does patient have an advance directive? No  Type of Advance Directive -  Would patient like information on creating an advanced directive? No - patient declined information     Chief Complaint  Patient presents with  . Medical Management of Chronic Issues    HPI:  Pt is a 82 y.o. female seen today at [facility]  for medical management of chronic diseases. She is seen in her room today. She is pleasantly confused unable to provide HPI and ROS. Facility Nurse reports no recent hospital admission, no recent fall episodes or skin breakdown. She continues to require total care assistance with ADL's. She was seen by [facility] 09/27/2015 her Aricept decreased to 5 mg Tablet daily.    Past Medical History:  Diagnosis Date  . Abnormality of gait 05/15/2013  . Breast cancer ([place]) 1980   right  . Dementia   . Depression   . DM (diabetes mellitus) ([facility])   . HTN (hypertension)   . Hypercholesterolemia   . Intrauterine pessary   . Memory difficulty 05/15/2013  . Osteoporosis   . TIA (transient ischemic attack)   . Wrist fracture 05/2011   Past Surgical History:  Procedure Laterality Date  . INTRAMEDULLARY (IM) NAIL INTERTROCHANTERIC Left 08/27/2014   Procedure:  INTRAMEDULLARY (IM) NAIL INTERTROCHANTRIC;  Surgeon: Renette Butters, MD;  Location: [facility];  Service: Orthopedics;  Laterality: Left;  [name] MASTECTOMY MODIFIED RADICAL Right    30 years ago    Allergies  Allergen Reactions  . Penicillins Other (See Comments)    Unsure of reaction      Medication List       Accurate as of 09/28/15  1:08 PM. Always use your most recent med list.          acetaminophen 325 MG tablet Commonly known as:  TYLENOL Take 650 mg by mouth every 6 (six) hours as needed for moderate pain.   ALPRAZolam 0.25 MG tablet Commonly known as:  XANAX Take 0.25 mg by mouth daily as needed for anxiety.   apixaban 2.5 MG Tabs tablet Commonly known as:  ELIQUIS Take 1 tablet (2.5 mg total) by mouth 2 (two) times daily.   diltiazem 240 MG 24 hr capsule Commonly known as:  DILACOR XR Take 240 mg by mouth daily. For Afib   docusate sodium 100 MG capsule Commonly known as:  COLACE Take 1 capsule (100 mg total) by mouth 2 (two) times daily.   donepezil 5 MG tablet Commonly known as:  ARICEPT Take 1 tablet (5 mg total) by mouth at bedtime.   ezetimibe 10 MG tablet Commonly known as:  ZETIA Take 10 mg by mouth daily. For hyperlipidemia   glimepiride 2 MG tablet Commonly known as:  AMARYL Take 2 mg  by mouth daily with breakfast. For diabetes   HUMALOG 100 UNIT/ML injection Generic drug:  insulin lispro Sliding scale dosing per CBG results BID 201-250=3 units, 251-350= 5 units, >351=call MD   loratadine 10 MG tablet Commonly known as:  CLARITIN Take 10 mg by mouth daily.   memantine 28 MG Cp24 24 hr capsule Commonly known as:  NAMENDA XR Take 1 capsule (28 mg total) by mouth daily.   mirtazapine 15 MG tablet Commonly known as:  REMERON Take 15 mg by mouth at bedtime.   multivitamin tablet Take 1 tablet by mouth daily.   ondansetron 4 MG tablet Commonly known as:  ZOFRAN Take 1 tablet (4 mg total) by mouth every 8 (eight) hours as needed for nausea  or vomiting.   PARoxetine 40 MG tablet Commonly known as:  PAXIL Take 40 mg by mouth daily. For depression   polyethylene glycol packet Commonly known as:  MIRALAX / GLYCOLAX Take 17 g by mouth daily. Hold for loose stools   SENNA PLUS 8.6-50 MG tablet Generic drug:  senna-docusate Take 1 tablet by mouth 2 (two) times daily.   UNABLE TO FIND Take 40 mLs by mouth 3 (three) times daily. Med Name: Might shakes three times daily with lunch, Dinner and bedtime       Review of Systems  Unable to perform ROS: Dementia    Immunization History  Administered Date(s) Administered  . Influenza-Unspecified 12/04/2014  . PPD Test 10/28/2014   Pertinent  Health Maintenance Due  Topic Date Due  . INFLUENZA VACCINE  09/21/2015  . HEMOGLOBIN A1C  02/21/2016 (Originally 04/10/2015)  . PNA vac Low Risk Adult (1 of 2 - PCV13) 03/27/2016 (Originally 09/22/1994)  . DEXA SCAN  03/26/2023 (Originally 09/22/1994)  . FOOT EXAM  03/09/2016  . OPHTHALMOLOGY EXAM  03/16/2016  . URINE MICROALBUMIN  07/23/2016      Vitals:   09/28/15 1151  BP: 112/74  Pulse: 72  Resp: 14  Temp: 98.2 F (36.8 C)  SpO2: 96%  Weight: 97 lb (44 kg)  Height: 5\' 6"  (1.676 m)   Body mass index is 15.66 kg/m. Physical Exam  Constitutional: She appears well-developed.  Frail Elderly in no acute distress.   HENT:  Head: Normocephalic.  Mouth/Throat: Oropharynx is clear and moist.  Eyes: Conjunctivae and EOM are normal. Pupils are equal, round, and reactive to light. Right eye exhibits no discharge. Left eye exhibits no discharge. No scleral icterus.  Neck: Neck supple. No JVD present. No tracheal deviation present.  Cardiovascular: Intact distal pulses.  Exam reveals no gallop and no friction rub.   No murmur heard. HR irregular   Pulmonary/Chest: Effort normal and breath sounds normal. No respiratory distress. She has no wheezes. She has no rales.  Abdominal: Soft. Bowel sounds are normal. She exhibits no  distension and no mass. There is no tenderness. There is no rebound and no guarding.  Genitourinary:  Genitourinary Comments:  incontinent for both bowel and bladder.   Musculoskeletal: She exhibits no edema or tenderness.  Left LE stiffness and contracture noted. wheelchair bound.   Lymphadenopathy:    She has no cervical adenopathy.  Neurological: She is alert.  Follows simple commands but pleasantly confused   Skin: Skin is warm and dry. No rash noted. No erythema. No pallor.   Provolone boots on Bilateral lower extremities.   Psychiatric: She has a normal mood and affect.    Labs reviewed:  Recent Labs  01/11/15 02/23/15 04/28/15 08/23/15  NA 141  141 140 140  140  K 4.3 4.1  --  3.8  3.8  BUN 20 20 14 19  19   CREATININE 0.6 0.6 0.5 0.5  0.5    Recent Labs  10/08/14 1210 01/11/15  AST 14 14  ALT 11 15  ALKPHOS 143* 145*    Recent Labs  04/07/15 04/28/15 08/23/15  WBC 11.8 8.6 10.0  HGB 13.5 12.5 13.0  HCT 40 39 40  PLT 358 347 357   Lab Results  Component Value Date   TSH 0.457 08/25/2014   Lab Results  Component Value Date   HGBA1C 8.4 07/23/2015   Lab Results  Component Value Date   CHOL 189 07/23/2015   HDL 56 07/23/2015   LDLCALC 101 07/23/2015   TRIG 159 07/23/2015   CHOLHDL 3.9 08/26/2014    Assessment/Plan 1. Controlled type 2 diabetes mellitus with complication, without long-term current use of insulin (HCC) CBG's ranging 100-180's with occasional low 200's. Continue Amaryl 2 mg Tablet and Humalog per SSI. Foot exam 04/2015. Awaiting eye exam appointment.   2. Essential hypertension B/p stable. Continue to monitor BMP  3. Chronic atrial fibrillation (HCC) HR irregular. Continue on EliQuis 2.5 mg Tablet and Diltiazem 240 mg Capsule.   4. Dementia without behavioral disturbance No new behavioral issues. Seen by [facility] 09/27/2015  Aricept decreased to 5 mg Tablet. Continue to assist with ADL's. Fall and safety precautions.   5.  Hyperlipidemia Continue on Zetia. Monitor lipid panel every 3 -6 months.   6. Protein-calorie malnutrition ([place]) Has improved. Continue on might shakes three times daily per RD recommendation. Monitor weight. Continue MVI.     Family/ staff Communication:Reviewed plan of care with facility Nurse supervisor.   Labs/tests ordered: None

## 2015-10-28 ENCOUNTER — Non-Acute Institutional Stay (SKILLED_NURSING_FACILITY): Payer: Medicare Other | Admitting: Family

## 2015-10-28 DIAGNOSIS — F0390 Unspecified dementia without behavioral disturbance: Secondary | ICD-10-CM | POA: Diagnosis not present

## 2015-10-28 DIAGNOSIS — E785 Hyperlipidemia, unspecified: Secondary | ICD-10-CM | POA: Diagnosis not present

## 2015-10-28 DIAGNOSIS — E1100 Type 2 diabetes mellitus with hyperosmolarity without nonketotic hyperglycemic-hyperosmolar coma (NKHHC): Secondary | ICD-10-CM | POA: Diagnosis not present

## 2015-10-28 DIAGNOSIS — I10 Essential (primary) hypertension: Secondary | ICD-10-CM

## 2015-10-28 DIAGNOSIS — I482 Chronic atrial fibrillation, unspecified: Secondary | ICD-10-CM

## 2015-10-28 NOTE — Progress Notes (Signed)
Location:   [facility] Room Number: 303A Place of Service:  SNF (31) Provider: Dinah Ngetich FNP-C   Blanchie Serve, MD  Patient Care Team: Blanchie Serve, MD as PCP - General (Internal Medicine)  Extended Emergency Contact Information Primary Emergency Contact: Patrycja, Bagshaw Address: [place] [address] [place] Phone: [phone] Relation: Spouse Secondary Emergency Contact: Duffus,David Address: [address]          [place], [place] [address] [place] Phone: [phone] Mobile Phone: [phone] Relation: Son  Code Status:  Full Code   Goals of care: Advanced Directive information Advanced Directives 09/01/2015  Does patient have an advance directive? No  Type of Advance Directive -  Would patient like information on creating an advanced directive? No - patient declined information     Chief Complaint  Patient presents with  . Medical Management of Chronic Issues    HPI:  Pt is a 82 y.o. female seen today at [facility] for medical management of chronic diseases. She has a medical history of Type 2 DM, HTN, Dementia  Among other conditions. She is seen in her room today. She is unable to provide HPI and ROS due to presence of dementia. Facility staff reports no new concerns. No recent weight loss, no pressure ulcer, recent fall episodes or hospital admission.     Past Medical History:  Diagnosis Date  . Abnormality of gait 05/15/2013  . Breast cancer ([place]) 1980   right  . Dementia   . Depression   . DM (diabetes mellitus) ([place])   . HTN (hypertension)   . Hypercholesterolemia   . Intrauterine pessary   . Memory difficulty 05/15/2013  . Osteoporosis   . TIA (transient ischemic attack)   . Wrist fracture 05/2011   Past Surgical History:  Procedure Laterality Date  . INTRAMEDULLARY (IM) NAIL INTERTROCHANTERIC Left 08/27/2014   Procedure:  INTRAMEDULLARY (IM) NAIL INTERTROCHANTRIC;  Surgeon: Renette Butters, MD;  Location: [facility];  Service: Orthopedics;  Laterality: Left;  [name] MASTECTOMY MODIFIED RADICAL Right    30 years ago    Allergies  Allergen Reactions  . Penicillins Other (See Comments)    Unsure of reaction      Medication List       Accurate as of 10/28/15  5:50 PM. Always use your most recent med list.          acetaminophen 325 MG tablet Commonly known as:  TYLENOL Take 650 mg by mouth every 6 (six) hours as needed for moderate pain.   ALPRAZolam 0.25 MG tablet Commonly known as:  XANAX Take 0.25 mg by mouth daily as needed for anxiety.   apixaban 2.5 MG Tabs tablet Commonly known as:  ELIQUIS Take 1 tablet (2.5 mg total) by mouth 2 (two) times daily.   diltiazem 240 MG 24 hr capsule Commonly known as:  DILACOR XR Take 240 mg by mouth daily. For Afib   docusate sodium 100 MG capsule Commonly known as:  COLACE Take 1 capsule (100 mg total) by mouth 2 (two) times daily.   donepezil 5 MG tablet Commonly known as:  ARICEPT Take 1 tablet (5 mg total) by mouth at bedtime.   ezetimibe 10 MG tablet Commonly known as:  ZETIA Take 10 mg by mouth daily. For hyperlipidemia   glimepiride 2 MG tablet Commonly known as:  AMARYL Take  2 mg by mouth daily with breakfast. For diabetes   HUMALOG 100 UNIT/ML injection Generic drug:  insulin lispro Sliding scale dosing per CBG results BID 201-250=3 units, 251-350= 5 units, >351=call MD   loratadine 10 MG tablet Commonly known as:  CLARITIN Take 10 mg by mouth daily.   memantine 28 MG Cp24 24 hr capsule Commonly known as:  NAMENDA XR Take 1 capsule (28 mg total) by mouth daily.   mirtazapine 15 MG tablet Commonly known as:  REMERON Take 15 mg by mouth at bedtime.   multivitamin tablet Take 1 tablet by mouth daily.   ondansetron 4 MG tablet Commonly known as:  ZOFRAN Take 1 tablet (4 mg total) by mouth every 8 (eight) hours as needed for nausea  or vomiting.   PARoxetine 40 MG tablet Commonly known as:  PAXIL Take 40 mg by mouth daily. For depression   polyethylene glycol packet Commonly known as:  MIRALAX / GLYCOLAX Take 17 g by mouth daily. Hold for loose stools   SENNA PLUS 8.6-50 MG tablet Generic drug:  senna-docusate Take 1 tablet by mouth 2 (two) times daily.   UNABLE TO FIND Take 40 mLs by mouth 3 (three) times daily. Med Name: Might shakes three times daily with lunch, Dinner and bedtime       Review of Systems  Unable to perform ROS: Dementia    Immunization History  Administered Date(s) Administered  . Influenza-Unspecified 12/04/2014  . PPD Test 10/28/2014   Pertinent  Health Maintenance Due  Topic Date Due  . INFLUENZA VACCINE  09/21/2015  . PNA vac Low Risk Adult (1 of 2 - PCV13) 03/27/2016 (Originally 09/22/1994)  . DEXA SCAN  03/26/2023 (Originally 09/22/1994)  . HEMOGLOBIN A1C  01/22/2016  . FOOT EXAM  03/09/2016  . OPHTHALMOLOGY EXAM  03/16/2016  . URINE MICROALBUMIN  07/23/2016   No flowsheet data found. Functional Status Survey:    Vitals:   10/28/15 1045  BP: 118/66  Pulse: 72  Resp: 16  Temp: 98.2 F (36.8 C)  SpO2: 96%  Weight: 96 lb 3.2 oz (43.6 kg)  Height: 5\' 6"  (1.676 m)   Body mass index is 15.53 kg/m. Physical Exam  Constitutional: She appears well-developed.  Frail Elderly in no acute distress. Pleasantly confused at baseline.   HENT:  Head: Normocephalic.  Mouth/Throat: Oropharynx is clear and moist.  Eyes: Conjunctivae and EOM are normal. Pupils are equal, round, and reactive to light. Right eye exhibits no discharge. Left eye exhibits no discharge. No scleral icterus.  Neck: Neck supple. No JVD present. No tracheal deviation present.  Cardiovascular: Intact distal pulses.  Exam reveals no gallop and no friction rub.   No murmur heard. HR irregular   Pulmonary/Chest: Effort normal and breath sounds normal. No respiratory distress. She has no wheezes. She has no  rales.  Abdominal: Soft. Bowel sounds are normal. She exhibits no distension and no mass. There is no tenderness. There is no rebound and no guarding.  Genitourinary:  Genitourinary Comments:  incontinent for both bowel and bladder.   Musculoskeletal: She exhibits no edema, tenderness or deformity.  Left LE stiffness and contracture noted. wheelchair bound.   Lymphadenopathy:    She has no cervical adenopathy.  Neurological: She is alert.  Follows simple commands but pleasantly confused at baseline.   Skin: Skin is warm and dry. No rash noted. No erythema. No pallor.   Provolone boots to lower extremities in place. Skin intact.    Psychiatric: She has a  normal mood and affect.    Labs reviewed:  Recent Labs  01/11/15 02/23/15 04/28/15 08/23/15  NA 141 141 140 140  140  K 4.3 4.1  --  3.8  3.8  BUN 20 20 14 19  19   CREATININE 0.6 0.6 0.5 0.5  0.5    Recent Labs  01/11/15  AST 14  ALT 15  ALKPHOS 145*    Recent Labs  04/07/15 04/28/15 08/23/15  WBC 11.8 8.6 10.0  HGB 13.5 12.5 13.0  HCT 40 39 40  PLT 358 347 357   Lab Results  Component Value Date   TSH 0.457 08/25/2014   Lab Results  Component Value Date   HGBA1C 8.4 07/23/2015   Lab Results  Component Value Date   CHOL 189 07/23/2015   HDL 56 07/23/2015   LDLCALC 101 07/23/2015   TRIG 159 07/23/2015   CHOLHDL 3.9 08/26/2014    Assessment/Plan 1. Essential hypertension B/p stable. Continue on Diltiazem 240 mg Capsule. Monitor BMP   2. Chronic atrial fibrillation (HCC) Continue on Eliquis 2.5 mg Tablet and Diltiazem 240 mg Capsule.  3. Type 2 diabetes mellitus with hyperosmolarity without coma, unspecified long term insulin use status (HCC) CBG's ranging 130's-200's. Continue on Humalog. Monitor Hgb A1C.   4. Dementia without behavioral disturbance No new behavioral issues. Continue on Aricept and Namenda. Continue to assist with ADL's.   5. Hyperlipidemia Continue on Zetia.     Family/  staff Communication: Reviewed plan of care with facility Nurse supervisor.  Labs/tests ordered: None

## 2015-11-29 ENCOUNTER — Non-Acute Institutional Stay (SKILLED_NURSING_FACILITY): Payer: Medicare Other | Admitting: Internal Medicine

## 2015-11-29 ENCOUNTER — Encounter: Payer: Self-pay | Admitting: Internal Medicine

## 2015-11-29 DIAGNOSIS — K5909 Other constipation: Secondary | ICD-10-CM | POA: Diagnosis not present

## 2015-11-29 DIAGNOSIS — F0150 Vascular dementia without behavioral disturbance: Secondary | ICD-10-CM

## 2015-11-29 DIAGNOSIS — R638 Other symptoms and signs concerning food and fluid intake: Secondary | ICD-10-CM | POA: Diagnosis not present

## 2015-11-29 DIAGNOSIS — G309 Alzheimer's disease, unspecified: Secondary | ICD-10-CM

## 2015-11-29 DIAGNOSIS — F0280 Dementia in other diseases classified elsewhere without behavioral disturbance: Secondary | ICD-10-CM | POA: Diagnosis not present

## 2015-11-29 DIAGNOSIS — Z872 Personal history of diseases of the skin and subcutaneous tissue: Secondary | ICD-10-CM | POA: Diagnosis not present

## 2015-11-29 DIAGNOSIS — I482 Chronic atrial fibrillation, unspecified: Secondary | ICD-10-CM

## 2015-11-29 DIAGNOSIS — E1151 Type 2 diabetes mellitus with diabetic peripheral angiopathy without gangrene: Secondary | ICD-10-CM

## 2015-11-29 NOTE — Progress Notes (Signed)
Patient ID: Brittney Powell, female   DOB: 01-01-1928, 82 y.o.   MRN: [id number]     Facility: [facility]    PCP: Blanchie Serve, MD  Code Status: full code  Allergies  Allergen Reactions  . Penicillins Other (See Comments)    Unsure of reaction    Chief Complaint  Patient presents with  . Medical Management of Chronic Issues    Routine Visit     HPI:  82 y.o. year old patient is seen for routine visit. Her husband is at bedside. She has dementia and has limited participation in HPI and ROS. She denies any concerns this visit. She needs assistance with her ADLs and is under total care. She is OOB daily. She has to be wheeled around on a wheelchair. She has leg braces to both legs. She gets skin care. Per husband, she has poor po intake. She is on supplemental feed and is followed by RD. Her pressure ulcer to her left buttock has resolved.   Review of Systems: per nursing and family Constitutional: Negative for fever HENT: Negative for headache Respiratory: Negative for cough, shortness of breath and wheezing.   Cardiovascular: Negative for chest pain, palpitations, leg swelling.  Gastrointestinal: Negative for heartburn, nausea, vomiting, abdominal pain Genitourinary: Negative for dysuria Musculoskeletal: Negative for back pain. No fall preorted Skin: Negative for itching, rash. gets preventative dressing.  Neurological: Negative for dizziness and behavior changes. Has dementia.     Past Medical History:  Diagnosis Date  . Abnormality of gait 05/15/2013  . Breast cancer ([place]) 1980   right  . Dementia   . Depression   . DM (diabetes mellitus) ([place])   . HTN (hypertension)   . Hypercholesterolemia   . Intrauterine pessary   . Memory difficulty 05/15/2013  . Osteoporosis   . TIA (transient ischemic attack)   . Wrist fracture 05/2011    Medications: Patient's Medications  New Prescriptions   No medications on file  Previous Medications   ACETAMINOPHEN (TYLENOL) 325 MG TABLET    Take 650 mg by mouth every 6 (six) hours as needed for moderate pain.   ALPRAZOLAM (XANAX) 0.25 MG TABLET    Take 0.25 mg by mouth daily as needed for anxiety.   APIXABAN (ELIQUIS) 2.5 MG TABS TABLET    Take 1 tablet (2.5 mg total) by mouth 2 (two) times daily.   DILTIAZEM (DILACOR XR) 240 MG 24 HR CAPSULE    Take 240 mg by mouth daily. For Afib   DOCUSATE SODIUM (COLACE) 100 MG CAPSULE    Take 1 capsule (100 mg total) by mouth 2 (two) times daily.   DONEPEZIL (ARICEPT) 5 MG TABLET    Take 1 tablet (5 mg total) by mouth at bedtime.   EZETIMIBE (ZETIA) 10 MG TABLET    Take 10 mg by mouth daily. For hyperlipidemia   GLIMEPIRIDE (AMARYL) 2 MG TABLET    Take 2 mg by mouth daily with breakfast. For diabetes   INSULIN LISPRO (HUMALOG) 100 UNIT/ML INJECTION    Sliding scale dosing per CBG results BID 201-250=3 units, 251-350= 5 units, >351=call MD   LORATADINE (CLARITIN) 10 MG TABLET    Take 10 mg by mouth daily.   MEMANTINE (NAMENDA XR) 28 MG CP24 24 HR CAPSULE    Take 1 capsule (28 mg total) by mouth daily.   MIRTAZAPINE (REMERON) 15 MG TABLET    Take 15 mg by mouth at bedtime.   MULTIPLE VITAMIN (MULTIVITAMIN) TABLET  Take 1 tablet by mouth daily.   ONDANSETRON (ZOFRAN) 4 MG TABLET    Take 1 tablet (4 mg total) by mouth every 8 (eight) hours as needed for nausea or vomiting.   PAROXETINE (PAXIL) 40 MG TABLET    Take 40 mg by mouth daily. For depression   POLYETHYLENE GLYCOL (MIRALAX / GLYCOLAX) PACKET    Take 17 g by mouth daily. Hold for loose stools   SENNA-DOCUSATE (SENNA PLUS) 8.6-50 MG TABLET    Take 1 tablet by mouth 2 (two) times daily.   UNABLE TO FIND    Med Name: Might shakes three times daily with lunch, Dinner and bedtime. Med pass 120 mL by mouth 3 times daily  Modified Medications   No medications on file  Discontinued Medications   No medications on file     Physical Exam: Vitals:   11/29/15 1305  BP: 118/66  Pulse: 68  Resp: 20    Temp: 97.6 F (36.4 C)  TempSrc: Oral  SpO2: 97%  Weight: 96 lb 3.2 oz (43.6 kg)  Height: 5\' 6"  (1.676 m)  Body mass index is 15.53 kg/m.  Wt Readings from Last 3 Encounters:  11/29/15 96 lb 3.2 oz (43.6 kg)  10/28/15 96 lb 3.2 oz (43.6 kg)  09/28/15 97 lb (44 kg)    General- elderly female, frail, in no acute distress, thin built Head- normocephalic, atraumatic Throat- moist mucus membrane Eyes- no pallor, no icterus, no discharge Neck- no cervical lymphadenopathy Cardiovascular- irregular heart rate, no murmur, no leg edema Respiratory- bilateral clear to auscultation, no wheeze, no rhonchi, no crackles, no use of accessory muscles Abdomen- bowel sounds present, soft, non tender Musculoskeletal- able to move her upper extremities, contracture to left lower leg, has heel floaters to both heels, has brace to both legs, has to be wheeled around in her wheelchair Skin- preventative dressing to right malleolus Neurological- alert and oriented to self Psychiatry- normal affect   Labs reviewed: Basic Metabolic Panel:  Recent Labs  01/11/15 02/23/15 04/28/15 08/23/15  NA 141 141 140 140  140  K 4.3 4.1  --  3.8  3.8  BUN 20 20 14 19  19   CREATININE 0.6 0.6 0.5 0.5  0.5   Liver Function Tests:  Recent Labs  01/11/15  AST 14  ALT 15  ALKPHOS 145*   No results for input(s): LIPASE, AMYLASE in the last 8760 hours. No results for input(s): AMMONIA in the last 8760 hours. CBC:  Recent Labs  04/07/15 04/28/15 08/23/15  WBC 11.8 8.6 10.0  HGB 13.5 12.5 13.0  HCT 40 39 40  PLT 358 347 357      Lab Results  Component Value Date   HGBA1C 8.4 07/23/2015   Lab Results  Component Value Date   TSH 0.457 08/25/2014      Assessment/Plan  Poor oral intake Has poor po intake and has low BMI. Currently on remeron 15 mg daily to help with mood and to stimulate appetite. Followed by RD. Continue feeding supplement and assist with feeding. Decline anticipated with  her advanced dementia  Advanced Dementia Mixed with alzheimer's and vascular component. Provide supportive care. Currently under total care. Continue donepezil and namenda xr  Chronic constipation Continue colace 100 mg bid with senna s bid and miralax daily. Hydration to be maintained  afib has controlled heart rate. continue diltiazem CD 240 mg daily with eliquis 2.5 mg bid for anticoagulation and monitor  Diabetes mellitus type 2 with vascular complications Lab  Results  Component Value Date   HGBA1C 8.4 07/23/2015   Reviewed a1c. Monitor cbg. Continue amaryl 2 mg daily and zetia. Continue glimepiride and lispro SSI. Goal a1c <7. Check urine for microalbumin and check a1c. uptodate with foot exam 06/21/15. Needs eye exam. Provide skin care. Monitor for wound/ sores to her limbs  History of pressure ulcer Her stage 2 PU to buttock resolved. Continue preventative dressing and pressure ulcer prophylaxis   Blanchie Serve, MD Internal Medicine [facility] [address] [place], [place] [address] Cell Phone (Monday-Friday 8 am - 5 pm): [phone] On Call: [phone] and follow prompts after 5 pm and on weekends Office Phone: [phone] Office Fax: [phone]

## 2015-12-01 LAB — HEMOGLOBIN A1C: HEMOGLOBIN A1C: 8

## 2015-12-01 NOTE — Progress Notes (Signed)
HPI: follow-up atrial fibrillation. Patient previously admitted after falling and fracturing hip. She was noted to be in atrial fibrillation. She was treated with Cardizem for rate control and apixaban. Echocardiogram July 2016 showed normal LV function and mild biatrial enlargement. Thyroid ultrasound showed nodules largest being 4.6 cm. Pt instructed to fu with primary care for this issue. Chest CT showed no pulmonary embolus. TSH and free T4 normal. Since last seen, she denies chest pain, dyspnea, palpitations or syncope. No bleeding. Significant dementia.  Current Outpatient Prescriptions  Medication Sig Dispense Refill  . acetaminophen (TYLENOL) 325 MG tablet Take 650 mg by mouth every 6 (six) hours as needed for moderate pain.    [name] ALPRAZolam (XANAX) 0.25 MG tablet Take 0.25 mg by mouth daily as needed for anxiety.    [name] apixaban (ELIQUIS) 2.5 MG TABS tablet Take 1 tablet (2.5 mg total) by mouth 2 (two) times daily. 60 tablet 60  . diltiazem (DILACOR XR) 240 MG 24 hr capsule Take 120 mg by mouth daily. For Afib     . docusate sodium (COLACE) 100 MG capsule Take 1 capsule (100 mg total) by mouth 2 (two) times daily. 10 capsule 0  . donepezil (ARICEPT) 5 MG tablet Take 1 tablet (5 mg total) by mouth at bedtime. 30 tablet 0  . ezetimibe (ZETIA) 10 MG tablet Take 10 mg by mouth daily. For hyperlipidemia    . glimepiride (AMARYL) 2 MG tablet Take 2 mg by mouth daily with breakfast. For diabetes    . insulin lispro (HUMALOG) 100 UNIT/ML injection Sliding scale dosing per CBG results BID 201-250=3 units, 251-350= 5 units, >351=call MD    . loratadine (CLARITIN) 10 MG tablet Take 10 mg by mouth daily.    . memantine (NAMENDA XR) 28 MG CP24 24 hr capsule Take 1 capsule (28 mg total) by mouth daily. 90 capsule 1  . mirtazapine (REMERON) 15 MG tablet Take 15 mg by mouth at bedtime.    . Multiple Vitamin (MULTIVITAMIN) tablet Take 1 tablet by mouth daily.    . ondansetron (ZOFRAN) 4 MG tablet  Take 1 tablet (4 mg total) by mouth every 8 (eight) hours as needed for nausea or vomiting. 20 tablet 0  . PARoxetine (PAXIL) 40 MG tablet Take 40 mg by mouth daily. For depression    . polyethylene glycol (MIRALAX / GLYCOLAX) packet Take 17 g by mouth daily. Hold for loose stools    . senna-docusate (SENNA PLUS) 8.6-50 MG tablet Take 1 tablet by mouth 2 (two) times daily.    [name] UNABLE TO FIND Med Name: Might shakes three times daily with lunch, Dinner and bedtime. Med pass 120 mL by mouth 3 times daily     No current facility-administered medications for this visit.      Past Medical History:  Diagnosis Date  . Abnormality of gait 05/15/2013  . Breast cancer ([place]) 1980   right  . Dementia   . Depression   . DM (diabetes mellitus) ([place])   . HTN (hypertension)   . Hypercholesterolemia   . Intrauterine pessary   . Memory difficulty 05/15/2013  . Osteoporosis   . TIA (transient ischemic attack)   . Wrist fracture 05/2011    Past Surgical History:  Procedure Laterality Date  . INTRAMEDULLARY (IM) NAIL INTERTROCHANTERIC Left 08/27/2014   Procedure: INTRAMEDULLARY (IM) NAIL INTERTROCHANTRIC;  Surgeon: Renette Butters, MD;  Location: [facility];  Service: Orthopedics;  Laterality: Left;  [name] MASTECTOMY MODIFIED RADICAL Right  30 years ago    Social History   Social History  . Marital status: Married    Spouse name: [name]  . Number of children: 2  . Years of education: 9TH   Occupational History  . Retired    Social History Main Topics  . Smoking status: Never Smoker  . Smokeless tobacco: Never Used  . Alcohol use No  . Drug use: No  . Sexual activity: Not on file   Other Topics Concern  . Not on file   Social History Narrative   Patient is married to [name]).   Retired.    Education high school.    Right handed.    Family History  Problem Relation Age of Onset  . Diabetes Father   . Cancer Father     leukemia  . Heart attack Brother   . Dementia Maternal Aunt      ROS: no fevers or chills, productive cough, hemoptysis, dysphasia, odynophagia, melena, hematochezia, dysuria, hematuria, rash, seizure activity, orthopnea, PND, pedal edema, claudication. Remaining systems are negative.  Physical Exam: Well-developed frail in no acute distress.  Skin is warm and dry.  HEENT is normal.  Neck is supple.  Chest is clear to auscultation with normal expansion.  Cardiovascular exam is irregular and tachycardic Abdominal exam nontender or distended. No masses palpated. Extremities show no edema. Lower extremities in braces due to contractures neuro significant dementia  ECG-atrial fibrillation at a rate of 125. Nonspecific ST changes.  A/P  1 permanent atrial fibrillation-heart rate is elevated. Increase Cardizem to 180 mg daily and follow. Continue  Apixaban. Hemoglobin and renal function are monitored at the nursing home where she resides  2 significant dementia-management per primary care.  3 hypertension-blood pressure controlled.   Kirk Ruths, MD

## 2015-12-03 ENCOUNTER — Encounter: Payer: Self-pay | Admitting: Cardiology

## 2015-12-03 ENCOUNTER — Ambulatory Visit (INDEPENDENT_AMBULATORY_CARE_PROVIDER_SITE_OTHER): Payer: Medicare Other | Admitting: Cardiology

## 2015-12-03 VITALS — BP 100/74 | HR 125 | Ht 66.0 in | Wt 97.0 lb

## 2015-12-03 DIAGNOSIS — I482 Chronic atrial fibrillation: Secondary | ICD-10-CM | POA: Diagnosis not present

## 2015-12-03 DIAGNOSIS — I4821 Permanent atrial fibrillation: Secondary | ICD-10-CM

## 2015-12-03 DIAGNOSIS — I10 Essential (primary) hypertension: Secondary | ICD-10-CM | POA: Diagnosis not present

## 2015-12-03 DIAGNOSIS — I4891 Unspecified atrial fibrillation: Secondary | ICD-10-CM

## 2015-12-03 MED ORDER — DILTIAZEM HCL ER 180 MG PO CP24: 180.0000 mg | ORAL_CAPSULE | Freq: Every day | ORAL | Status: DC

## 2015-12-03 NOTE — Patient Instructions (Signed)
Medication Instructions:   INCREASE CARDIZEM TO 180 MG ONCE DAILY  Follow-Up:  Your physician wants you to follow-up in: [facility] will receive a reminder letter in the mail two months in advance. If you don't receive a letter, please call our office to schedule the follow-up appointment.

## 2016-01-02 ENCOUNTER — Non-Acute Institutional Stay (SKILLED_NURSING_FACILITY): Payer: Medicare Other | Admitting: Family

## 2016-01-02 DIAGNOSIS — F411 Generalized anxiety disorder: Secondary | ICD-10-CM | POA: Diagnosis not present

## 2016-01-02 DIAGNOSIS — I10 Essential (primary) hypertension: Secondary | ICD-10-CM | POA: Diagnosis not present

## 2016-01-02 DIAGNOSIS — I482 Chronic atrial fibrillation, unspecified: Secondary | ICD-10-CM

## 2016-01-02 DIAGNOSIS — E1165 Type 2 diabetes mellitus with hyperglycemia: Secondary | ICD-10-CM | POA: Diagnosis not present

## 2016-01-02 DIAGNOSIS — IMO0001 Reserved for inherently not codable concepts without codable children: Secondary | ICD-10-CM

## 2016-01-02 DIAGNOSIS — F0390 Unspecified dementia without behavioral disturbance: Secondary | ICD-10-CM | POA: Diagnosis not present

## 2016-01-02 DIAGNOSIS — E782 Mixed hyperlipidemia: Secondary | ICD-10-CM

## 2016-01-02 NOTE — Progress Notes (Signed)
Location:  [facility] Room Number: 303A  Place of Service:  SNF (31) Provider:  Wilson Sample FNP-C   Blanchie Serve, MD  Patient Care Team: Blanchie Serve, MD as PCP - General (Internal Medicine)  Extended Emergency Contact Information Primary Emergency Contact: Maliyah, Meitzler Address: [place] [address] [place] Phone: [phone] Relation: Spouse Secondary Emergency Contact: Rampersaud,David Address: [address]          [place], [place] [address] [place] of [place] Phone: [phone] Mobile Phone: [phone] Relation: Son  Code Status: Full Code Goals of care: Advanced Directive information Advanced Directives 09/01/2015  Does patient have an advance directive? No  Type of Advance Directive -  Would patient like information on creating an advanced directive? No - patient declined information     Chief Complaint  Patient presents with  . Medical Management of Chronic Issues    HPI:  Pt is a 82 y.o. female seen today at [facility] for medical management of chronic diseases. She has a medical history of HTN, AFib, Dementia, Type 2 DM, Hyperlipidemia among other conditions. She is seen in her room today with husband at bedside. Patient husband reports no acute issues.She has had less than one pound weight loss since one month ago.No recent fall episodes or hospital admission. No skin breakdown. Facility staff reports no new concerns.    Past Medical History:  Diagnosis Date  . Abnormality of gait 05/15/2013  . Breast cancer ([place]) 1980   right  . Dementia   . Depression   . DM (diabetes mellitus) ([place])   . HTN (hypertension)   . Hypercholesterolemia   . Intrauterine pessary   . Memory difficulty 05/15/2013  . Osteoporosis   . TIA (transient ischemic attack)   . Wrist fracture 05/2011   Past Surgical History:  Procedure Laterality Date  . INTRAMEDULLARY  (IM) NAIL INTERTROCHANTERIC Left 08/27/2014   Procedure: INTRAMEDULLARY (IM) NAIL INTERTROCHANTRIC;  Surgeon: Renette Butters, MD;  Location: [facility];  Service: Orthopedics;  Laterality: Left;  [name] MASTECTOMY MODIFIED RADICAL Right    30 years ago    Allergies  Allergen Reactions  . Penicillins Other (See Comments)    Unsure of reaction      Medication List       Accurate as of 01/02/16 11:38 AM. Always use your most recent med list.          acetaminophen 325 MG tablet Commonly known as:  TYLENOL Take 650 mg by mouth every 6 (six) hours as needed for moderate pain.   ALPRAZolam 0.25 MG tablet Commonly known as:  XANAX Take 0.25 mg by mouth daily as needed for anxiety.   apixaban 2.5 MG Tabs tablet Commonly known as:  ELIQUIS Take 1 tablet (2.5 mg total) by mouth 2 (two) times daily.   diltiazem 180 MG 24 hr capsule Commonly known as:  DILACOR XR Take 1 capsule (180 mg total) by mouth daily. For Afib   docusate sodium 100 MG capsule Commonly known as:  COLACE Take 1 capsule (100 mg total) by mouth 2 (two) times daily.   donepezil 5 MG tablet Commonly known as:  ARICEPT Take 1 tablet (5 mg total) by mouth at bedtime.   ezetimibe 10 MG tablet Commonly known as:  ZETIA Take 10 mg by mouth daily. For hyperlipidemia   glimepiride 2 MG tablet Commonly known  as:  AMARYL Take 2 mg by mouth daily with breakfast. For diabetes   HUMALOG 100 UNIT/ML injection Generic drug:  insulin lispro Sliding scale dosing per CBG results BID 201-250=3 units, 251-350= 5 units, >351=call MD   loratadine 10 MG tablet Commonly known as:  CLARITIN Take 10 mg by mouth daily.   memantine 28 MG Cp24 24 hr capsule Commonly known as:  NAMENDA XR Take 1 capsule (28 mg total) by mouth daily.   mirtazapine 15 MG tablet Commonly known as:  REMERON Take 15 mg by mouth at bedtime.   multivitamin tablet Take 1 tablet by mouth daily.   ondansetron 4 MG tablet Commonly known as:  ZOFRAN Take  1 tablet (4 mg total) by mouth every 8 (eight) hours as needed for nausea or vomiting.   PARoxetine 40 MG tablet Commonly known as:  PAXIL Take 40 mg by mouth daily. For depression   polyethylene glycol packet Commonly known as:  MIRALAX / GLYCOLAX Take 17 g by mouth daily. Hold for loose stools   SENNA PLUS 8.6-50 MG tablet Generic drug:  senna-docusate Take 1 tablet by mouth 2 (two) times daily.   UNABLE TO FIND Med Name: Might shakes three times daily with lunch, Dinner and bedtime. Med pass 120 mL by mouth 3 times daily       Review of Systems  Unable to perform ROS: Dementia    Immunization History  Administered Date(s) Administered  . Influenza-Unspecified 12/04/2014  . PPD Test 10/28/2014   Pertinent  Health Maintenance Due  Topic Date Due  . INFLUENZA VACCINE  09/21/2015  . PNA vac Low Risk Adult (1 of 2 - PCV13) 03/27/2016 (Originally 09/22/1994)  . DEXA SCAN  03/26/2023 (Originally 09/22/1994)  . HEMOGLOBIN A1C  01/22/2016  . FOOT EXAM  03/09/2016  . OPHTHALMOLOGY EXAM  03/16/2016  . URINE MICROALBUMIN  07/23/2016      Vitals:   12/27/15 1100  BP: 110/63  Pulse: 73  Resp: 18  Temp: 98.6 F (37 C)  SpO2: 99%  Weight: 95 lb (43.1 kg)  Height: 5\' 6"  (1.676 m)   Body mass index is 15.33 kg/m. Physical Exam  Constitutional: She appears well-developed.  Frail elderly pleasantly confused at baseline.   HENT:  Head: Normocephalic.  Mouth/Throat: Oropharynx is clear and moist.  Eyes: Conjunctivae and EOM are normal. Pupils are equal, round, and reactive to light. Right eye exhibits no discharge. Left eye exhibits no discharge. No scleral icterus.  Neck: Neck supple. No JVD present. No tracheal deviation present.  Cardiovascular: Intact distal pulses.  Exam reveals no gallop and no friction rub.   No murmur heard. HR irregular   Pulmonary/Chest: Effort normal and breath sounds normal. No respiratory distress. She has no wheezes. She has no rales.    Abdominal: Soft. Bowel sounds are normal. She exhibits no distension and no mass. There is no tenderness. There is no rebound and no guarding.  Genitourinary:  Genitourinary Comments:  incontinent for both bowel and bladder.   Musculoskeletal: She exhibits no edema, tenderness or deformity.  Wheelchair bound. Left LE stiffness and contracture. Provolone boots in place.   Lymphadenopathy:    She has no cervical adenopathy.  Neurological: She is alert.  Follows simple commands but pleasantly confused at baseline.   Skin: Skin is warm and dry. No rash noted. No erythema. No pallor.   Provolone boots to lower extremities in place. Skin intact.    Psychiatric: She has a normal mood and affect.  Labs reviewed:  Recent Labs  01/11/15 02/23/15 04/28/15 08/23/15  NA 141 141 140 140  140  K 4.3 4.1  --  3.8  3.8  BUN 20 20 14 19  19   CREATININE 0.6 0.6 0.5 0.5  0.5    Recent Labs  01/11/15  AST 14  ALT 15  ALKPHOS 145*    Recent Labs  04/07/15 04/28/15 08/23/15  WBC 11.8 8.6 10.0  HGB 13.5 12.5 13.0  HCT 40 39 40  PLT 358 347 357   Lab Results  Component Value Date   TSH 0.457 08/25/2014   Lab Results  Component Value Date   HGBA1C 8.4 07/23/2015   Lab Results  Component Value Date   CHOL 189 07/23/2015   HDL 56 07/23/2015   LDLCALC 101 07/23/2015   TRIG 159 07/23/2015   CHOLHDL 3.9 08/26/2014   Assessment/Plan 1. Uncontrolled type 2 diabetes mellitus without complication, without long-term current use of insulin (HCC) CBG's ranging in the 110's-200's in AM and 130's-200's in PM.continue on Amaryl and Humalog. Monitor Hgb A1C.   2. Essential hypertension B/p stable. Continue to monitor BMP  3. Chronic atrial fibrillation (HCC) Seen by DR. Marlan Palau at [facility] dose adjusted to 180 mg Tablet. Continue Cardizem and Apixaban. Monitor.   4. Mixed hyperlipidemia Continue on Zetia.   5. Dementia without behavioral disturbance, unspecified  dementia type Decline expected. Continue on Namenda. Continue to assist with ADL's.   6. Anxiety state Continue on Alprazolam.     Family/ staff Communication: Reviewed plan with patient's Husband and facility Nurse supervisor.  Labs/tests ordered: None

## 2016-01-26 ENCOUNTER — Encounter: Payer: Self-pay | Admitting: Family

## 2016-01-26 ENCOUNTER — Non-Acute Institutional Stay (SKILLED_NURSING_FACILITY): Payer: Medicare Other | Admitting: Family

## 2016-01-26 DIAGNOSIS — I482 Chronic atrial fibrillation, unspecified: Secondary | ICD-10-CM

## 2016-01-26 DIAGNOSIS — F411 Generalized anxiety disorder: Secondary | ICD-10-CM | POA: Diagnosis not present

## 2016-01-26 DIAGNOSIS — E782 Mixed hyperlipidemia: Secondary | ICD-10-CM

## 2016-01-26 DIAGNOSIS — F0390 Unspecified dementia without behavioral disturbance: Secondary | ICD-10-CM | POA: Diagnosis not present

## 2016-01-26 DIAGNOSIS — E1100 Type 2 diabetes mellitus with hyperosmolarity without nonketotic hyperglycemic-hyperosmolar coma (NKHHC): Secondary | ICD-10-CM | POA: Diagnosis not present

## 2016-01-26 MED ORDER — ALPRAZOLAM 0.25 MG PO TABS: 0.2500 mg | ORAL_TABLET | Freq: Every day | ORAL | Status: AC

## 2016-01-26 NOTE — Progress Notes (Signed)
Location:  [facility] Room Number: [id number] Place of Service:  SNF (31) Provider: Raylyn Speckman FNP-C   Blanchie Serve, MD  Patient Care Team: Blanchie Serve, MD as PCP - General (Internal Medicine)  Extended Emergency Contact Information Primary Emergency Contact: Shamane, Cargill Address: [place] [address] [place] Phone: [phone] Relation: Spouse Secondary Emergency Contact: Dobias,David Address: [address]          [place], [place] [address] [place] of [place] Phone: [phone] Mobile Phone: [phone] Relation: Son  Code Status:  Full Code  Goals of care: Advanced Directive information Advanced Directives 09/01/2015  Does Patient Have a Medical Advance Directive? No  Type of Advance Directive -  Would patient like information on creating a medical advance directive? No - patient declined information     Chief Complaint  Patient presents with  . Medical Management of Chronic Issues    Routine Visit    HPI:  Pt is a 82 y.o. female seen today at [facility] medical management of chronic diseases. She has a medical history of advance dementia, Type 2 DM, Hyperlipidemia, Afib, Failure to thrive among other conditions. She is seen in her room today with Husband at bedside. She continues to be pleasantly confused.Patient's husband reports patient has had increased anxiety crying especially in the mornings. She has had no recent weight changes, no fall episodes or hospital admission. Facility staff reports no new concerns. Her CBG's log ranging in the 140's-180's.    Past Medical History:  Diagnosis Date  . Abnormality of gait 05/15/2013  . Breast cancer ([facility]) 1980   right  . Dementia   . Depression   . DM (diabetes mellitus) ([facility])   . HTN (hypertension)   . Hypercholesterolemia   . Intrauterine pessary   . Memory difficulty 05/15/2013  . Osteoporosis    . TIA (transient ischemic attack)   . Wrist fracture 05/2011   Past Surgical History:  Procedure Laterality Date  . INTRAMEDULLARY (IM) NAIL INTERTROCHANTERIC Left 08/27/2014   Procedure: INTRAMEDULLARY (IM) NAIL INTERTROCHANTRIC;  Surgeon: Renette Butters, MD;  Location: [facility];  Service: Orthopedics;  Laterality: Left;  [name] MASTECTOMY MODIFIED RADICAL Right    30 years ago    Allergies  Allergen Reactions  . Penicillins Other (See Comments)    Unsure of reaction      Medication List       Accurate as of 01/26/16  9:36 AM. Always use your most recent med list.          acetaminophen 325 MG tablet Commonly known as:  TYLENOL Take 650 mg by mouth every 6 (six) hours as needed for moderate pain.   ALPRAZolam 0.25 MG tablet Commonly known as:  XANAX Take 0.25 mg by mouth daily as needed for anxiety.   apixaban 2.5 MG Tabs tablet Commonly known as:  ELIQUIS Take 1 tablet (2.5 mg total) by mouth 2 (two) times daily.   diltiazem 180 MG 24 hr capsule Commonly known as:  DILACOR XR Take 1 capsule (180 mg total) by mouth daily. For Afib   docusate sodium 100 MG capsule Commonly known as:  COLACE Take 1 capsule (100 mg total) by mouth 2 (two) times daily.   donepezil 5 MG tablet Commonly known as:  ARICEPT Take 1 tablet (5 mg total) by mouth at bedtime.   ezetimibe 10 MG tablet  Commonly known as:  ZETIA Take 10 mg by mouth daily. For hyperlipidemia   glimepiride 2 MG tablet Commonly known as:  AMARYL Take 2 mg by mouth daily with breakfast. For diabetes   HUMALOG 100 UNIT/ML injection Generic drug:  insulin lispro Sliding scale dosing per CBG results BID 201-250=3 units, 251-350= 5 units, >351=call MD   loratadine 10 MG tablet Commonly known as:  CLARITIN Take 10 mg by mouth daily.   memantine 28 MG Cp24 24 hr capsule Commonly known as:  NAMENDA XR Take 1 capsule (28 mg total) by mouth daily.   mirtazapine 15 MG tablet Commonly known as:  REMERON Take 15 mg  by mouth at bedtime.   multivitamin tablet Take 1 tablet by mouth daily.   ondansetron 4 MG tablet Commonly known as:  ZOFRAN Take 1 tablet (4 mg total) by mouth every 8 (eight) hours as needed for nausea or vomiting.   PARoxetine 40 MG tablet Commonly known as:  PAXIL Take 40 mg by mouth daily. For depression   polyethylene glycol packet Commonly known as:  MIRALAX / GLYCOLAX Take 17 g by mouth daily. Hold for loose stools   SENNA PLUS 8.6-50 MG tablet Generic drug:  senna-docusate Take 1 tablet by mouth 2 (two) times daily.   UNABLE TO FIND Med Name: Might shakes three times daily with lunch, Dinner and bedtime. Med pass 120 mL by mouth 3 times daily       Review of Systems  Unable to perform ROS: Dementia    Immunization History  Administered Date(s) Administered  . Influenza-Unspecified 12/04/2014, 12/08/2015  . PPD Test 10/28/2014   Pertinent  Health Maintenance Due  Topic Date Due  . HEMOGLOBIN A1C  01/22/2016  . PNA vac Low Risk Adult (1 of 2 - PCV13) 03/27/2016 (Originally 09/22/1994)  . DEXA SCAN  03/26/2023 (Originally 09/22/1994)  . FOOT EXAM  03/09/2016  . OPHTHALMOLOGY EXAM  03/16/2016  . URINE MICROALBUMIN  07/23/2016  . INFLUENZA VACCINE  Completed      Vitals:   01/26/16 0930  BP: 123/81  Pulse: 72  Resp: 16  Temp: 98.1 F (36.7 C)  TempSrc: Oral  Weight: 95 lb (43.1 kg)  Height: 5\' 6"  (1.676 m)   Body mass index is 15.33 kg/m. Physical Exam  Constitutional: She appears well-developed.  Frail elderly pleasantly confused at baseline.   HENT:  Head: Normocephalic.  Mouth/Throat: Oropharynx is clear and moist.  Eyes: Conjunctivae and EOM are normal. Pupils are equal, round, and reactive to light. Right eye exhibits no discharge. Left eye exhibits no discharge. No scleral icterus.  Neck: Neck supple. No JVD present. No tracheal deviation present.  Cardiovascular: Intact distal pulses.  Exam reveals no gallop and no friction rub.   No  murmur heard. HR irregular   Pulmonary/Chest: Effort normal and breath sounds normal. No respiratory distress. She has no wheezes. She has no rales.  Abdominal: Soft. Bowel sounds are normal. She exhibits no distension and no mass. There is no tenderness. There is no rebound and no guarding.  Genitourinary:  Genitourinary Comments:  incontinent for both bowel and bladder.   Musculoskeletal: She exhibits no edema, tenderness or deformity.  Wheelchair bound. Left LE stiffness and contracture. Provolone boots in place.   Lymphadenopathy:    She has no cervical adenopathy.  Neurological: She is alert.  Follows simple commands but pleasantly confused at baseline.   Skin: Skin is warm and dry. No rash noted. No erythema. No pallor.  No skin breakdown noted. Provolone boots to lower extremities in place.     Psychiatric: She has a normal mood and affect.    Labs reviewed:  Recent Labs  02/23/15 04/28/15 08/23/15  NA 141 140 140  140  K 4.1  --  3.8  3.8  BUN 20 14 19  19   CREATININE 0.6 0.5 0.5  0.5    Recent Labs  04/07/15 04/28/15 08/23/15  WBC 11.8 8.6 10.0  HGB 13.5 12.5 13.0  HCT 40 39 40  PLT 358 347 357   Lab Results  Component Value Date   TSH 0.457 08/25/2014   Lab Results  Component Value Date   HGBA1C 8.0 12/01/2015   Lab Results  Component Value Date   CHOL 189 07/23/2015   HDL 56 07/23/2015   LDLCALC 101 07/23/2015   TRIG 159 07/23/2015   CHOLHDL 3.9 08/26/2014   Assessment/Plan Type 2 DM CBG's ranging 140's-180's. Continue on Glimepiride and Humalog. Monitor Hgb A1C.   Afib  Continue on EliQuis  And Diltiazem.   Dementia without behavioral disturbance No new behavioral issues. Continue to assist with ADl's . Continue MVI, Namenda and Aricept.   Anxiety Increased anxiety reported by spouse. Has Alprazolam PRN but unable to request med. Will D/c current Xanax orders then Start Alprazolam 0.25 mg Tablet at bedtime. Continue to monitor.    Hyperlipidemia  Continue on Zetia. Monitor lipid panel.    Family/ staff Communication: Reviewed plan of care with patient, Patient's POA and facility Nurse supervisor.   Labs/tests ordered: None

## 2016-02-15 ENCOUNTER — Non-Acute Institutional Stay (SKILLED_NURSING_FACILITY): Payer: Medicare Other | Admitting: Family

## 2016-02-15 ENCOUNTER — Encounter: Payer: Self-pay | Admitting: Family

## 2016-02-15 DIAGNOSIS — R112 Nausea with vomiting, unspecified: Secondary | ICD-10-CM

## 2016-02-15 DIAGNOSIS — J028 Acute pharyngitis due to other specified organisms: Secondary | ICD-10-CM | POA: Diagnosis not present

## 2016-02-15 DIAGNOSIS — B9689 Other specified bacterial agents as the cause of diseases classified elsewhere: Secondary | ICD-10-CM | POA: Diagnosis not present

## 2016-02-15 MED ORDER — DOXYCYCLINE HYCLATE 100 MG PO TABS: 100.0000 mg | ORAL_TABLET | Freq: Two times a day (BID) | ORAL | 0 refills | Status: DC

## 2016-02-15 NOTE — Progress Notes (Signed)
Location:  [facility] Room Number: [id number] Place of Service:  SNF (31) Provider: Irmgard Rampersaud FNP-C   Blanchie Serve, MD  Patient Care Team: Blanchie Serve, MD as PCP - General (Internal Medicine)  Extended Emergency Contact Information Primary Emergency Contact: Ironesha, Rothe Address: [place] [address] [place] Phone: [phone] Relation: Spouse Secondary Emergency Contact: Breed,David Address: [address]          [place], [place] [address] [place] of [place] Phone: [phone] Mobile Phone: [phone] Relation: Son  Code Status:  Full Code  Goals of care: Advanced Directive information Advanced Directives 09/01/2015  Does Patient Have a Medical Advance Directive? No  Type of Advance Directive -  Would patient like information on creating a medical advance directive? No - patient declined information     Chief Complaint  Patient presents with  . Acute Visit    sore throat, Nausea and vomiting     HPI:  Pt is a 82 y.o. female seen today at [facility] for acute evaluation of sore throat and nausea and vomiting. She has a medical history of advance dementia, Type 2 DM, Hyperlipidemia, Afib, Failure to thrive among other conditions. She is seen in her room today with Husband and daughter at bedside. Patient's husband reports patient had an episode of nausea and vomiting this morning unable to tolerate her breakfast. Nausea medication given with relief. He also states patient complains of pain in the throat. She is pleasantly confused unable to provide HPI and ROS though grimaces on assessment. Temp 99.3, HR 90 b/min   Past Medical History:  Diagnosis Date  . Abnormality of gait 05/15/2013  . Breast cancer ([place]) 1980   right  . Dementia   . Depression   . DM (diabetes mellitus) ([facility])   . HTN (hypertension)   . Hypercholesterolemia   . Intrauterine  pessary   . Memory difficulty 05/15/2013  . Osteoporosis   . TIA (transient ischemic attack)   . Wrist fracture 05/2011   Past Surgical History:  Procedure Laterality Date  . INTRAMEDULLARY (IM) NAIL INTERTROCHANTERIC Left 08/27/2014   Procedure: INTRAMEDULLARY (IM) NAIL INTERTROCHANTRIC;  Surgeon: Renette Butters, MD;  Location: [facility];  Service: Orthopedics;  Laterality: Left;  [name] MASTECTOMY MODIFIED RADICAL Right    30 years ago    Allergies  Allergen Reactions  . Penicillins Other (See Comments)    Unsure of reaction    Allergies as of 02/15/2016      Reactions   Penicillins Other (See Comments)   Unsure of reaction      Medication List       Accurate as of 02/15/16  4:35 PM. Always use your most recent med list.          acetaminophen 325 MG tablet Commonly known as:  TYLENOL Take 650 mg by mouth every 6 (six) hours as needed for moderate pain.   ALPRAZolam 0.25 MG tablet Commonly known as:  XANAX Take 1 tablet (0.25 mg total) by mouth at bedtime.   apixaban 2.5 MG Tabs tablet Commonly known as:  ELIQUIS Take 1 tablet (2.5 mg total) by mouth 2 (two) times daily.   diltiazem 180 MG 24 hr capsule Commonly known as:  DILACOR XR Take 1 capsule (180 mg total) by mouth daily. For Afib   docusate sodium 100 MG capsule Commonly known as:  COLACE  Take 1 capsule (100 mg total) by mouth 2 (two) times daily.   donepezil 5 MG tablet Commonly known as:  ARICEPT Take 1 tablet (5 mg total) by mouth at bedtime.   doxycycline 100 MG tablet Commonly known as:  VIBRA-TABS Take 1 tablet (100 mg total) by mouth 2 (two) times daily.   ezetimibe 10 MG tablet Commonly known as:  ZETIA Take 10 mg by mouth daily. For hyperlipidemia   glimepiride 2 MG tablet Commonly known as:  AMARYL Take 2 mg by mouth daily with breakfast. For diabetes   HUMALOG 100 UNIT/ML injection Generic drug:  insulin lispro Sliding scale dosing per CBG results BID 201-250=3 units, 251-350= 5  units, >351=call MD   loratadine 10 MG tablet Commonly known as:  CLARITIN Take 10 mg by mouth daily.   memantine 28 MG Cp24 24 hr capsule Commonly known as:  NAMENDA XR Take 1 capsule (28 mg total) by mouth daily.   mirtazapine 15 MG tablet Commonly known as:  REMERON Take 15 mg by mouth at bedtime.   multivitamin tablet Take 1 tablet by mouth daily.   NUTRITIONAL SUPPLEMENT PO Take 1 each by mouth 2 (two) times daily. With lunch and dinner due to being underweight   NUTRITIONAL SUPPLEMENT Liqd Take 120 mLs by mouth 3 (three) times daily. MedPass due to weight loss   ondansetron 4 MG tablet Commonly known as:  ZOFRAN Take 1 tablet (4 mg total) by mouth every 8 (eight) hours as needed for nausea or vomiting.   PARoxetine 40 MG tablet Commonly known as:  PAXIL Take 40 mg by mouth daily. For depression   polyethylene glycol packet Commonly known as:  MIRALAX / GLYCOLAX Take 17 g by mouth daily. Hold for loose stools   SENNA PLUS 8.6-50 MG tablet Generic drug:  senna-docusate Take 1 tablet by mouth 2 (two) times daily.       Review of Systems  Unable to perform ROS: Dementia    Immunization History  Administered Date(s) Administered  . Influenza-Unspecified 12/04/2014, 12/08/2015   Pertinent  Health Maintenance Due  Topic Date Due  . PNA vac Low Risk Adult (1 of 2 - PCV13) 03/27/2016 (Originally 09/22/1994)  . DEXA SCAN  03/26/2023 (Originally 09/22/1994)  . FOOT EXAM  03/09/2016  . OPHTHALMOLOGY EXAM  03/16/2016  . HEMOGLOBIN A1C  05/31/2016  . URINE MICROALBUMIN  07/23/2016  . INFLUENZA VACCINE  Completed      Vitals:   02/15/16 1523  BP: 118/74  Pulse: 90  Resp: 16  Temp: 97.7 F (36.5 C)  TempSrc: Oral  SpO2: 96%  Weight: 95 lb (43.1 kg)  Height: 5\' 6"  (1.676 m)   Body mass index is 15.33 kg/m. Physical Exam  Constitutional: She appears well-developed.  Frail elderly pleasantly confused at baseline grimaces during exam.  HENT:  Head:  Normocephalic.  Pharynx red with yellow exudate noted. Tonsil gland  tender to palpation.   Eyes: Conjunctivae and EOM are normal. Pupils are equal, round, and reactive to light. Right eye exhibits no discharge. Left eye exhibits no discharge. No scleral icterus.  Neck: Neck supple. No JVD present. No tracheal deviation present.  Cardiovascular: Intact distal pulses.  Exam reveals no gallop and no friction rub.   No murmur heard. HR irregular   Pulmonary/Chest: Effort normal and breath sounds normal. No respiratory distress. She has no wheezes. She has no rales.  Abdominal: Soft. Bowel sounds are normal. She exhibits no distension and no mass. There is no  tenderness. There is no rebound and no guarding.  Genitourinary:  Genitourinary Comments:  incontinent for both bowel and bladder.   Musculoskeletal: She exhibits no edema, tenderness or deformity.  Wheelchair bound. Left LE contracture. Provolone boots in place.   Lymphadenopathy:    She has no cervical adenopathy.  Neurological: She is alert.  Follows simple commands but pleasantly confused at baseline.   Skin: Skin is warm and dry. No rash noted. No erythema. No pallor.  Psychiatric: She has a normal mood and affect.    Labs reviewed:  Recent Labs  02/23/15 04/28/15 08/23/15  NA 141 140 140  140  K 4.1  --  3.8  3.8  BUN 20 14 19  19   CREATININE 0.6 0.5 0.5  0.5    Recent Labs  04/07/15 04/28/15 08/23/15  WBC 11.8 8.6 10.0  HGB 13.5 12.5 13.0  HCT 40 39 40  PLT 358 347 357   Lab Results  Component Value Date   TSH 0.457 08/25/2014   Lab Results  Component Value Date   HGBA1C 8.0 12/01/2015   Lab Results  Component Value Date   CHOL 189 07/23/2015   HDL 56 07/23/2015   LDLCALC 101 07/23/2015   TRIG 159 07/23/2015   CHOLHDL 3.9 08/26/2014   Assessment/Plan  Acute Pharyngitis  Temp 99.3, HR 90 b/min. Pharynx red with exudate noted.Tonsil gland tender to touch.Patient allergic to PCN. Will initiate  Doxycycline 100 mg Tablet twice daily X 10 days along with Florastor 250 mg Capsule twice daily x 10 days for prophylaxis. Encourage fluid intake.   Nausea/Vomiting  Has had x 1 episode. Phenergan given with relief. Add Zofran 4 mg Tablet every 6 hours by mouth as needed  X 14 days then D/C.     Family/ staff Communication: Reviewed plan of care with patient, Patient's POA and facility Nurse supervisor.   Labs/tests ordered: None

## 2016-02-16 LAB — BASIC METABOLIC PANEL
BUN: 27 mg/dL — AB (ref 4–21)
CREATININE: 0.5 mg/dL (ref 0.5–1.1)
GLUCOSE: 201 mg/dL
Potassium: 3.9 mmol/L (ref 3.4–5.3)
Sodium: 143 mmol/L (ref 137–147)

## 2016-02-16 LAB — CBC AND DIFFERENTIAL
HCT: 39 % (ref 36–46)
Hemoglobin: 13.1 g/dL (ref 12.0–16.0)
PLATELETS: 291 10*3/uL (ref 150–399)
WBC: 8.5 10*3/mL

## 2016-02-23 LAB — BASIC METABOLIC PANEL
BUN: 13 mg/dL (ref 4–21)
CREATININE: 0.4 mg/dL — AB (ref 0.5–1.1)
GLUCOSE: 163 mg/dL
POTASSIUM: 3.8 mmol/L (ref 3.4–5.3)
SODIUM: 144 mmol/L (ref 137–147)

## 2016-02-23 LAB — CBC AND DIFFERENTIAL
HCT: 36 % (ref 36–46)
Hemoglobin: 11.9 g/dL — AB (ref 12.0–16.0)
Platelets: 333 10*3/uL (ref 150–399)
WBC: 11.4 10^3/mL

## 2016-02-25 ENCOUNTER — Encounter: Payer: Self-pay | Admitting: Family

## 2016-02-25 ENCOUNTER — Non-Acute Institutional Stay (SKILLED_NURSING_FACILITY): Payer: Medicare Other | Admitting: Family

## 2016-02-25 DIAGNOSIS — E782 Mixed hyperlipidemia: Secondary | ICD-10-CM

## 2016-02-25 DIAGNOSIS — E1165 Type 2 diabetes mellitus with hyperglycemia: Secondary | ICD-10-CM | POA: Diagnosis not present

## 2016-02-25 DIAGNOSIS — I482 Chronic atrial fibrillation, unspecified: Secondary | ICD-10-CM

## 2016-02-25 DIAGNOSIS — F0390 Unspecified dementia without behavioral disturbance: Secondary | ICD-10-CM | POA: Diagnosis not present

## 2016-02-25 DIAGNOSIS — IMO0001 Reserved for inherently not codable concepts without codable children: Secondary | ICD-10-CM

## 2016-02-25 NOTE — Progress Notes (Signed)
Location:  [facility] Room Number: 303A Place of Service:  SNF (31) Provider: Dinah Ngetich FNP-C   Blanchie Serve, MD  Patient Care Team: Blanchie Serve, MD as PCP - General (Internal Medicine)  Extended Emergency Contact Information Primary Emergency Contact: Shaelene, Wessling Address: [place] [address] [place] Phone: [phone] Relation: Spouse Secondary Emergency Contact: Nyland,David Address: [address]          [place], [place] [address] [place] Phone: [phone] Mobile Phone: [phone] Relation: Son  Code Status:  Full Code  Goals of care: Advanced Directive information Advanced Directives 02/25/2016  Does Patient Have a Medical Advance Directive? No  Type of Advance Directive -  Would patient like information on creating a medical advance directive? -     Chief Complaint  Patient presents with  . Medical Management of Chronic Issues    routine visit    HPI:  Pt is a 83 y.o. female seen today at [facility] and [place] medical management of chronic diseases. She has a medical history of advance dementia, Type 2 DM, Hyperlipidemia, Afib, Failure to thrive among other conditions. She is seen in her room today. She is pleasantly confused but more alert and talkative this visit.She answers some questions appropriately at times. She was treated with I.M rocephin on 02/15/2016 for acute pharyngitis with much improvement. Facility staff states now eating without any complains of pain. She has had no recent weight changes, no fall episodes or hospital admission. Her CBG's log ranging in the 100's-190's.Facility staff reports no new concerns.  Past Medical History:  Diagnosis Date  . Abnormality of gait 05/15/2013  . Breast cancer ([place]) 1980   right  . Dementia   . Depression   . DM (diabetes mellitus) ([place])   . HTN (hypertension)   .  Hypercholesterolemia   . Intrauterine pessary   . Memory difficulty 05/15/2013  . Osteoporosis   . TIA (transient ischemic attack)   . Wrist fracture 05/2011   Past Surgical History:  Procedure Laterality Date  . INTRAMEDULLARY (IM) NAIL INTERTROCHANTERIC Left 08/27/2014   Procedure: INTRAMEDULLARY (IM) NAIL INTERTROCHANTRIC;  Surgeon: Renette Butters, MD;  Location: [facility];  Service: Orthopedics;  Laterality: Left;  [name] MASTECTOMY MODIFIED RADICAL Right    30 years ago    Allergies  Allergen Reactions  . Penicillins Other (See Comments)    Unsure of reaction    Allergies as of 02/25/2016      Reactions   Penicillins Other (See Comments)   Unsure of reaction      Medication List       Accurate as of 02/25/16  4:48 PM. Always use your most recent med list.          acetaminophen 325 MG tablet Commonly known as:  TYLENOL Take 650 mg by mouth every 6 (six) hours as needed for moderate pain.   ALPRAZolam 0.25 MG tablet Commonly known as:  XANAX Take 1 tablet (0.25 mg total) by mouth at bedtime.   apixaban 2.5 MG Tabs tablet Commonly known as:  ELIQUIS Take 1 tablet (2.5 mg total) by mouth 2 (two) times daily.   diltiazem 180 MG 24 hr capsule Commonly known as:  DILACOR XR Take 1 capsule (180 mg total) by mouth daily. For Afib   docusate sodium 100 MG capsule Commonly known as:  COLACE Take 1 capsule (  100 mg total) by mouth 2 (two) times daily.   donepezil 5 MG tablet Commonly known as:  ARICEPT Take 1 tablet (5 mg total) by mouth at bedtime.   ezetimibe 10 MG tablet Commonly known as:  ZETIA Take 10 mg by mouth daily. For hyperlipidemia   glimepiride 2 MG tablet Commonly known as:  AMARYL Take 2 mg by mouth daily with breakfast. For diabetes   HUMALOG 100 UNIT/ML injection Generic drug:  insulin lispro Sliding scale dosing per CBG results BID 201-250=3 units, 251-350= 5 units, >351=call MD   loratadine 10 MG tablet Commonly known as:  CLARITIN Take 10 mg by  mouth daily.   memantine 28 MG Cp24 24 hr capsule Commonly known as:  NAMENDA XR Take 1 capsule (28 mg total) by mouth daily.   mirtazapine 15 MG tablet Commonly known as:  REMERON Take 15 mg by mouth at bedtime.   multivitamin tablet Take 1 tablet by mouth daily.   NUTRITIONAL SUPPLEMENT PO Take 1 each by mouth 2 (two) times daily. With lunch and dinner due to being underweight   NUTRITIONAL SUPPLEMENT Liqd Take 120 mLs by mouth 3 (three) times daily. MedPass due to weight loss   ondansetron 4 MG tablet Commonly known as:  ZOFRAN Take 1 tablet (4 mg total) by mouth every 8 (eight) hours as needed for nausea or vomiting.   PARoxetine 40 MG tablet Commonly known as:  PAXIL Take 40 mg by mouth daily. For depression   polyethylene glycol packet Commonly known as:  MIRALAX / GLYCOLAX Take 17 g by mouth daily. Hold for loose stools   saccharomyces boulardii 250 MG capsule Commonly known as:  FLORASTOR Take 250 mg by mouth. Take one capsule twice daily for 10 days Start taking on:  02/14/2017   SENNA PLUS 8.6-50 MG tablet Generic drug:  senna-docusate Take 1 tablet by mouth 2 (two) times daily.       Review of Systems  Unable to perform ROS: Dementia    Immunization History  Administered Date(s) Administered  . Influenza-Unspecified 12/04/2014, 12/08/2015   Pertinent  Health Maintenance Due  Topic Date Due  . PNA vac Low Risk Adult (1 of 2 - PCV13) 03/27/2016 (Originally 09/22/1994)  . DEXA SCAN  03/26/2023 (Originally 09/22/1994)  . FOOT EXAM  03/09/2016  . OPHTHALMOLOGY EXAM  03/16/2016  . HEMOGLOBIN A1C  05/31/2016  . URINE MICROALBUMIN  07/23/2016  . INFLUENZA VACCINE  Completed      Vitals:   02/25/16 1145  BP: 107/68  Pulse: 78  Resp: 16  Temp: 97.9 F (36.6 C)  SpO2: 97%  Weight: 95 lb (43.1 kg)  Height: 5\' 6"  (1.676 m)   Body mass index is 15.33 kg/m. Physical Exam  Constitutional: She appears well-developed.  Frail elderly pleasantly  confused at baseline.More alert and talkative this visit.    HENT:  Head: Normocephalic.  Mouth/Throat: Oropharynx is clear and moist.  Eyes: Conjunctivae and EOM are normal. Pupils are equal, round, and reactive to light. Right eye exhibits no discharge. Left eye exhibits no discharge. No scleral icterus.  Neck: Neck supple. No JVD present. No tracheal deviation present.  Cardiovascular: Intact distal pulses.  Exam reveals no gallop and no friction rub.   No murmur heard. HR irregular   Pulmonary/Chest: Effort normal and breath sounds normal. No respiratory distress. She has no wheezes. She has no rales.  Abdominal: Soft. Bowel sounds are normal. She exhibits no distension and no mass. There is no tenderness. There  is no rebound and no guarding.  Genitourinary:  Genitourinary Comments:  incontinent for both bowel and bladder.   Musculoskeletal: She exhibits no edema, tenderness or deformity.  Wheelchair bound. Left LE stiffness and contracture. Provolone boots in place.   Lymphadenopathy:    She has no cervical adenopathy.  Neurological: She is alert.   pleasantly confused at baseline though more alert and talkative this visit.    Skin: Skin is warm and dry. No rash noted. No erythema. No pallor.  Provolone boots to lower extremities in place. Skin intact.     Psychiatric: She has a normal mood and affect.    Labs reviewed:  Recent Labs  04/28/15 08/23/15  NA 140 140  140  K  --  3.8  3.8  BUN 14 19  19   CREATININE 0.5 0.5  0.5    Recent Labs  04/07/15 04/28/15 08/23/15  WBC 11.8 8.6 10.0  HGB 13.5 12.5 13.0  HCT 40 39 40  PLT 358 347 357   Lab Results  Component Value Date   TSH 0.457 08/25/2014   Lab Results  Component Value Date   HGBA1C 8.0 12/01/2015   Lab Results  Component Value Date   CHOL 189 07/23/2015   HDL 56 07/23/2015   LDLCALC 101 07/23/2015   TRIG 159 07/23/2015   CHOLHDL 3.9 08/26/2014   Assessment/Plan Type 2 DM CBG's ranging  100's-190's. Last hgb A1C 8.0 ( 11/2015). Continue on Glimepiride and Humalog. Monitor Hgb A1C. Last foot exam 12/08/2015. Annual Eye exam done  10/05/2015.  Hyperlipidemia  Continue on Zetia. Monitor lipid panel.   Afib  Continue on EliQuis and Diltiazem.   Dementia without behavioral disturbance No new behavioral issues. Continue to assist with ADl's . Continue MVI, Namenda and Aricept. Fall and safety precautions. Skin Care.    Family/ staff Communication: Reviewed plan of care with patient and facility Nurse supervisor.   Labs/tests ordered: None

## 2016-03-27 ENCOUNTER — Encounter: Payer: Self-pay | Admitting: Family

## 2016-03-27 ENCOUNTER — Non-Acute Institutional Stay (SKILLED_NURSING_FACILITY): Payer: Medicare Other | Admitting: Family

## 2016-03-27 DIAGNOSIS — F0390 Unspecified dementia without behavioral disturbance: Secondary | ICD-10-CM | POA: Diagnosis not present

## 2016-03-27 DIAGNOSIS — E782 Mixed hyperlipidemia: Secondary | ICD-10-CM | POA: Diagnosis not present

## 2016-03-27 DIAGNOSIS — IMO0001 Reserved for inherently not codable concepts without codable children: Secondary | ICD-10-CM

## 2016-03-27 DIAGNOSIS — I482 Chronic atrial fibrillation, unspecified: Secondary | ICD-10-CM

## 2016-03-27 DIAGNOSIS — E1165 Type 2 diabetes mellitus with hyperglycemia: Secondary | ICD-10-CM | POA: Diagnosis not present

## 2016-03-27 NOTE — Progress Notes (Signed)
Location:  [facility] Room Number: [id number] Place of Service:  SNF (31) Provider: Ahley Bulls FNP-C   Blanchie Serve, MD  Patient Care Team: Blanchie Serve, MD as PCP - General (Internal Medicine)  Extended Emergency Contact Information Primary Emergency Contact: Natalee, Taormina Address: [place] [address] [place] Phone: [phone] Relation: Spouse Secondary Emergency Contact: Heslin,David Address: [address]          [place], [place] [address] [place] Phone: [phone] Mobile Phone: [phone] Relation: Son  Code Status:  Full Code  Goals of care: Advanced Directive information Advanced Directives 02/25/2016  Does Patient Have a Medical Advance Directive? No  Type of Advance Directive -  Would patient like information on creating a medical advance directive? -     Chief Complaint  Patient presents with  . Medical Management of Chronic Issues    Routine Visit     HPI:  Pt is a 83 y.o. female seen today at [facility] for medical management of chronic diseases. She has a medical history of advance dementia, Type 2 DM, Hyperlipidemia, Afib, anxiety,Failure to thrive among other conditions. She is seen in her room today with husband at bedside. Patient husband reports no new concerns.She has had no recent fall episode or weight changes. Her CBG's log ranging in the 100's-160's.she continues to require total care assistance. Facility staff reports no new concerns.  Past Medical History:  Diagnosis Date  . Abnormality of gait 05/15/2013  . Breast cancer ([place]) 1980   right  . Dementia   . Depression   . DM (diabetes mellitus) ([place])   . HTN (hypertension)   . Hypercholesterolemia   . Intrauterine pessary   . Memory difficulty 05/15/2013  . Osteoporosis   . TIA (transient ischemic attack)   . Wrist fracture 05/2011   Past Surgical History:    Procedure Laterality Date  . INTRAMEDULLARY (IM) NAIL INTERTROCHANTERIC Left 08/27/2014   Procedure: INTRAMEDULLARY (IM) NAIL INTERTROCHANTRIC;  Surgeon: Renette Butters, MD;  Location: [facility];  Service: Orthopedics;  Laterality: Left;  [name] MASTECTOMY MODIFIED RADICAL Right    30 years ago    Allergies  Allergen Reactions  . Penicillins Other (See Comments)    Unsure of reaction    Allergies as of 03/27/2016      Reactions   Penicillins Other (See Comments)   Unsure of reaction      Medication List       Accurate as of 03/27/16 12:33 PM. Always use your most recent med list.          acetaminophen 325 MG tablet Commonly known as:  TYLENOL Take 650 mg by mouth every 6 (six) hours as needed for moderate pain.   ALPRAZolam 0.25 MG tablet Commonly known as:  XANAX Take 1 tablet (0.25 mg total) by mouth at bedtime.   apixaban 2.5 MG Tabs tablet Commonly known as:  ELIQUIS Take 1 tablet (2.5 mg total) by mouth 2 (two) times daily.   diltiazem 180 MG 24 hr capsule Commonly known as:  DILACOR XR Take 1 capsule (180 mg total) by mouth daily. For Afib   docusate sodium 100 MG capsule Commonly known as:  COLACE Take 1 capsule (100 mg total) by mouth 2 (two) times daily.   donepezil 5 MG tablet Commonly known as:  ARICEPT Take 1 tablet (5 mg total)  by mouth at bedtime.   ezetimibe 10 MG tablet Commonly known as:  ZETIA Take 10 mg by mouth daily. For hyperlipidemia   glimepiride 2 MG tablet Commonly known as:  AMARYL Take 2 mg by mouth daily with breakfast. For diabetes   HUMALOG 100 UNIT/ML injection Generic drug:  insulin lispro Sliding scale dosing per CBG results BID 201-250=3 units, 251-350= 5 units, >351=call MD   loratadine 10 MG tablet Commonly known as:  CLARITIN Take 10 mg by mouth daily.   memantine 28 MG Cp24 24 hr capsule Commonly known as:  NAMENDA XR Take 1 capsule (28 mg total) by mouth daily.   mirtazapine 15 MG tablet Commonly known as:   REMERON Take 15 mg by mouth at bedtime.   multivitamin tablet Take 1 tablet by mouth daily.   NUTRITIONAL SUPPLEMENT PO Take 1 each by mouth 2 (two) times daily. With lunch and dinner due to being underweight   NUTRITIONAL SUPPLEMENT Liqd Take 120 mLs by mouth 3 (three) times daily. MedPass due to weight loss   ondansetron 4 MG tablet Commonly known as:  ZOFRAN Take 1 tablet (4 mg total) by mouth every 8 (eight) hours as needed for nausea or vomiting.   PARoxetine 40 MG tablet Commonly known as:  PAXIL Take 40 mg by mouth daily. For depression   polyethylene glycol packet Commonly known as:  MIRALAX / GLYCOLAX Take 17 g by mouth daily. Hold for loose stools   SENNA PLUS 8.6-50 MG tablet Generic drug:  senna-docusate Take 1 tablet by mouth 2 (two) times daily.       Review of Systems  Unable to perform ROS: Dementia    Immunization History  Administered Date(s) Administered  . Influenza-Unspecified 12/04/2014, 12/08/2015   Pertinent  Health Maintenance Due  Topic Date Due  . FOOT EXAM  03/09/2016  . OPHTHALMOLOGY EXAM  03/16/2016  . PNA vac Low Risk Adult (1 of 2 - PCV13) 03/27/2016 (Originally 09/22/1994)  . DEXA SCAN  03/26/2023 (Originally 09/22/1994)  . HEMOGLOBIN A1C  05/31/2016  . URINE MICROALBUMIN  07/23/2016  . INFLUENZA VACCINE  Completed      Vitals:   03/27/16 1219  BP: 118/74  Pulse: 78  Resp: 16  Temp: 97.7 F (36.5 C)  TempSrc: Oral  Weight: 95 lb (43.1 kg)  Height: 5\' 6"  (1.676 m)   Body mass index is 15.33 kg/m. Physical Exam  Constitutional: She appears well-developed.  Thin frail elderly pleasantly confused at baseline  HENT:  Head: Normocephalic.  Mouth/Throat: Oropharynx is clear and moist.  Eyes: Conjunctivae and EOM are normal. Pupils are equal, round, and reactive to light. Right eye exhibits no discharge. Left eye exhibits no discharge. No scleral icterus.  Neck: Neck supple. No JVD present. No tracheal deviation present.   Cardiovascular: Intact distal pulses.  Exam reveals no gallop and no friction rub.   No murmur heard. HR irregular   Pulmonary/Chest: Effort normal and breath sounds normal. No respiratory distress. She has no wheezes. She has no rales.  Abdominal: Soft. Bowel sounds are normal. She exhibits no distension and no mass. There is no tenderness. There is no rebound and no guarding.  Genitourinary:  Genitourinary Comments:  incontinent  Musculoskeletal: She exhibits no edema, tenderness or deformity.  Wheelchair bound. LLE stiffness and contracture . Provolone boots in place.   Lymphadenopathy:    She has no cervical adenopathy.  Neurological: She is alert.   pleasantly confused at baseline though more alert and talkative this  visit.    Skin: Skin is warm and dry. No rash noted. No erythema. No pallor.  Provolone boots to lower extremities in place. No skin breakdown    Psychiatric: She has a normal mood and affect.    Labs reviewed:  Recent Labs  08/23/15 02/16/16 02/23/16  NA 140  140 143 144  K 3.8  3.8 3.9 3.8  BUN 19  19 27* 13  CREATININE 0.5  0.5 0.5 0.4*    Recent Labs  08/23/15 02/16/16 02/23/16  WBC 10.0 8.5 11.4  HGB 13.0 13.1 11.9*  HCT 40 39 36  PLT 357 291 333   Lab Results  Component Value Date   TSH 0.457 08/25/2014   Lab Results  Component Value Date   HGBA1C 8.0 12/01/2015   Lab Results  Component Value Date   CHOL 189 07/23/2015   HDL 56 07/23/2015   LDLCALC 101 07/23/2015   TRIG 159 07/23/2015   CHOLHDL 3.9 08/26/2014   Assessment/Plan Afib  Continue on EliQuis and Diltiazem. Continue to monitor.   Type 2 DM CBG's ranging 100's-160's.Continue on Glimepiride and Humalog. Monitor Hgb A1C.  Hyperlipidemia  Continue on Zetia. Monitor lipid panel periodically.   Dementia  No new behavioral issues reported.Continue MVI, Namenda and Aricept.Continue to assist with ADL's. Continue fall and safety precautions.   Family/ staff Communication:  Reviewed plan of care with patient and facility Nurse supervisor.   Labs/tests ordered: None

## 2016-04-24 ENCOUNTER — Non-Acute Institutional Stay (SKILLED_NURSING_FACILITY): Payer: Medicare Other | Admitting: Family

## 2016-04-24 DIAGNOSIS — F411 Generalized anxiety disorder: Secondary | ICD-10-CM

## 2016-04-24 DIAGNOSIS — IMO0001 Reserved for inherently not codable concepts without codable children: Secondary | ICD-10-CM

## 2016-04-24 DIAGNOSIS — E1165 Type 2 diabetes mellitus with hyperglycemia: Secondary | ICD-10-CM | POA: Diagnosis not present

## 2016-04-24 DIAGNOSIS — I482 Chronic atrial fibrillation, unspecified: Secondary | ICD-10-CM

## 2016-04-24 NOTE — Progress Notes (Signed)
Location:  [facility] Room Number: [place] of Service:  SNF (31) Provider: Daelyn Pettaway FNP-C   Blanchie Serve, MD  Patient Care Team: Blanchie Serve, MD as PCP - General (Internal Medicine)  Extended Emergency Contact Information Primary Emergency Contact: Essa, Arehart Address: [place] [address] [place] Phone: [phone] Relation: Spouse Secondary Emergency Contact: Papadopoulos,David Address: [address]          [place], [place] [address] [place] Phone: [phone] Mobile Phone: [phone] Relation: Son  Code Status:  Full Code  Goals of care: Advanced Directive information Advanced Directives 02/25/2016  Does Patient Have a Medical Advance Directive? No  Type of Advance Directive -  Would patient like information on creating a medical advance directive? -     Chief Complaint  Patient presents with  . Medical Management of Chronic Issues    HPI:  Pt is a 83 y.o. female seen today at [facility] for medical management of chronic diseases. She has a medical history of advance dementia, Type 2 DM, Hyperlipidemia, Afib, anxiety,Failure to thrive among other conditions. She is seen in her room today with husband at bedside. Patient husband reports cries at times when husband is not around. Husband would like to continue on Alprazolam at bedtime then continue to monitor during the day. Will consider starting sertraline if symptoms are worsening.No recent illness,fall episode or weight changes since prior visit. Patient's husband states feeds her three times daily and has very good appetite. Facility staff reports no new concerns.  Past Medical History:  Diagnosis Date  . Abnormality of gait 05/15/2013  . Breast cancer ([place]) 1980   right  . Dementia   . Depression   . DM (diabetes mellitus) ([place])   . HTN (hypertension)   . Hypercholesterolemia     . Intrauterine pessary   . Memory difficulty 05/15/2013  . Osteoporosis   . TIA (transient ischemic attack)   . Wrist fracture 05/2011   Past Surgical History:  Procedure Laterality Date  . INTRAMEDULLARY (IM) NAIL INTERTROCHANTERIC Left 08/27/2014   Procedure: INTRAMEDULLARY (IM) NAIL INTERTROCHANTRIC;  Surgeon: Renette Butters, MD;  Location: [facility];  Service: Orthopedics;  Laterality: Left;  [name] MASTECTOMY MODIFIED RADICAL Right    30 years ago    Allergies  Allergen Reactions  . Penicillins Other (See Comments)    Unsure of reaction    Allergies as of 04/24/2016      Reactions   Penicillins Other (See Comments)   Unsure of reaction      Medication List       Accurate as of 04/24/16  5:21 PM. Always use your most recent med list.          acetaminophen 325 MG tablet Commonly known as:  TYLENOL Take 650 mg by mouth every 6 (six) hours as needed for moderate pain.   ALPRAZolam 0.25 MG tablet Commonly known as:  XANAX Take 1 tablet (0.25 mg total) by mouth at bedtime.   apixaban 2.5 MG Tabs tablet Commonly known as:  ELIQUIS Take 1 tablet (2.5 mg total) by mouth 2 (two) times daily.   diltiazem 180 MG 24 hr capsule Commonly known as:  DILACOR XR Take 1 capsule (180 mg total) by mouth daily. For Afib   docusate sodium 100 MG capsule Commonly known as:  COLACE Take 1 capsule (100  mg total) by mouth 2 (two) times daily.   donepezil 5 MG tablet Commonly known as:  ARICEPT Take 1 tablet (5 mg total) by mouth at bedtime.   ezetimibe 10 MG tablet Commonly known as:  ZETIA Take 10 mg by mouth daily. For hyperlipidemia   glimepiride 2 MG tablet Commonly known as:  AMARYL Take 2 mg by mouth daily with breakfast. For diabetes   HUMALOG 100 UNIT/ML injection Generic drug:  insulin lispro Sliding scale dosing per CBG results BID 201-250=3 units, 251-350= 5 units, >351=call MD   loratadine 10 MG tablet Commonly known as:  CLARITIN Take 10 mg by mouth daily.    memantine 28 MG Cp24 24 hr capsule Commonly known as:  NAMENDA XR Take 1 capsule (28 mg total) by mouth daily.   mirtazapine 15 MG tablet Commonly known as:  REMERON Take 15 mg by mouth at bedtime.   multivitamin tablet Take 1 tablet by mouth daily.   NUTRITIONAL SUPPLEMENT PO Take 1 each by mouth 2 (two) times daily. With lunch and dinner due to being underweight   NUTRITIONAL SUPPLEMENT Liqd Take 120 mLs by mouth 3 (three) times daily. MedPass due to weight loss   ondansetron 4 MG tablet Commonly known as:  ZOFRAN Take 1 tablet (4 mg total) by mouth every 8 (eight) hours as needed for nausea or vomiting.   PARoxetine 40 MG tablet Commonly known as:  PAXIL Take 40 mg by mouth daily. For depression   polyethylene glycol packet Commonly known as:  MIRALAX / GLYCOLAX Take 17 g by mouth daily. Hold for loose stools   SENNA PLUS 8.6-50 MG tablet Generic drug:  senna-docusate Take 1 tablet by mouth 2 (two) times daily.       Review of Systems  Unable to perform ROS: Dementia    Immunization History  Administered Date(s) Administered  . Influenza-Unspecified 12/04/2014, 12/08/2015   Pertinent  Health Maintenance Due  Topic Date Due  . PNA vac Low Risk Adult (1 of 2 - PCV13) 09/22/1994  . FOOT EXAM  03/09/2016  . OPHTHALMOLOGY EXAM  03/16/2016  . DEXA SCAN  03/26/2023 (Originally 09/22/1994)  . HEMOGLOBIN A1C  05/31/2016  . URINE MICROALBUMIN  07/23/2016  . INFLUENZA VACCINE  Completed      Vitals:   04/24/16 1000  BP: 120/70  Pulse: 89  Resp: 18  Temp: 98.8 F (37.1 C)  SpO2: 97%  Weight: 95 lb 6.4 oz (43.3 kg)  Height: 5\' 6"  (1.676 m)   Body mass index is 15.4 kg/m. Physical Exam  Constitutional: She appears well-developed.  Thin frail elderly pleasantly confused at her baseline.   HENT:  Head: Normocephalic.  Mouth/Throat: Oropharynx is clear and moist.  Eyes: Conjunctivae and EOM are normal. Pupils are equal, round, and reactive to light. Right  eye exhibits no discharge. Left eye exhibits no discharge. No scleral icterus.  Neck: Neck supple. No JVD present. No tracheal deviation present.  Cardiovascular: Intact distal pulses.  Exam reveals no gallop and no friction rub.   No murmur heard. HR irregular   Pulmonary/Chest: Effort normal and breath sounds normal. No respiratory distress. She has no wheezes. She has no rales.  Abdominal: Soft. Bowel sounds are normal. She exhibits no distension and no mass. There is no tenderness. There is no rebound and no guarding.  Genitourinary:  Genitourinary Comments:  incontinent  Musculoskeletal: She exhibits no edema, tenderness or deformity.  Wheelchair bound. LLE stiffness and contracture .Has bilateral Provolone boots in place.  Lymphadenopathy:    She has no cervical adenopathy.  Neurological: She is alert.   pleasantly confused at baseline though more alert and talkative this visit.    Skin: Skin is warm and dry. No rash noted. No erythema. No pallor.  skin intact.     Psychiatric: She has a normal mood and affect.  Anxious at times.     Labs reviewed:  Recent Labs  08/23/15 02/16/16 02/23/16  NA 140  140 143 144  K 3.8  3.8 3.9 3.8  BUN 19  19 27* 13  CREATININE 0.5  0.5 0.5 0.4*    Recent Labs  08/23/15 02/16/16 02/23/16  WBC 10.0 8.5 11.4  HGB 13.0 13.1 11.9*  HCT 40 39 36  PLT 357 291 333   Lab Results  Component Value Date   TSH 0.457 08/25/2014   Lab Results  Component Value Date   HGBA1C 8.0 12/01/2015   Lab Results  Component Value Date   CHOL 189 07/23/2015   HDL 56 07/23/2015   LDLCALC 101 07/23/2015   TRIG 159 07/23/2015   CHOLHDL 3.9 08/26/2014   Assessment/Plan Generalized Anxiety disorder  Cries at times when husband is not around. Husband would like to continue on Alprazolam at bedtime then continue to monitor during the day. Will consider starting sertraline if symptoms are worsening.   Type 2 DM CBG's ranging 80's-150's with  occasional 200's.Continue on Glimepiride and Humalog. Monitor Hgb A1C.  Afib  HR controlled.Continue on EliQuis and Diltiazem. Continue to monitor.   Family/ staff Communication: Reviewed plan of care with patient and facility Nurse supervisor.   Labs/tests ordered: None

## 2016-05-15 LAB — HEMOGLOBIN A1C: HEMOGLOBIN A1C: 6.7

## 2016-05-29 ENCOUNTER — Encounter: Payer: Self-pay | Admitting: Cardiology

## 2016-05-30 ENCOUNTER — Ambulatory Visit (INDEPENDENT_AMBULATORY_CARE_PROVIDER_SITE_OTHER): Payer: Medicare Other | Admitting: Adult Health

## 2016-05-30 ENCOUNTER — Encounter: Payer: Self-pay | Admitting: Adult Health

## 2016-05-30 VITALS — BP 135/92 | HR 99

## 2016-05-30 DIAGNOSIS — G309 Alzheimer's disease, unspecified: Secondary | ICD-10-CM | POA: Diagnosis not present

## 2016-05-30 DIAGNOSIS — F0280 Dementia in other diseases classified elsewhere without behavioral disturbance: Secondary | ICD-10-CM | POA: Diagnosis not present

## 2016-05-30 NOTE — Patient Instructions (Signed)
Continue Namenda XR 28 mg daily Continue Aricept 5 mg daily If your symptoms worsen or you develop new symptoms please let us know.

## 2016-05-30 NOTE — Progress Notes (Signed)
PATIENT: Brittney Powell DOB: 1928-01-20  REASON FOR VISIT: follow up- Alzheimer's disease HISTORY FROM: patient  HISTORY OF PRESENT ILLNESS: Brittney Powell is an 83 year old female with a history of Alzheimer's disease. She returns today for follow-up. She is here with her husband. She continues to reside at [facility]. She requires assistance with all ADLs. Her husband feeds her every single day. He states that her appetite is good and she is eating well. He states that there are some days that she is more interactive in conversation and others. He states that she is sleeping well. Denies any changes in her mood or behavior. Denies agitation or aggressiveness. Overall he feels that she has remained stable. She returns today for an evaluation.  HISTORY 09/27/15: Brittney Powell is an 83 year old right-handed white female with a history of Alzheimer's disease. The patient has continued to progress with her memory. The patient is also losing weight, over the last year her husband believes that she has lost 15-20 pounds. The patient is on Aricept and Namenda. She has some skin breakdown on one of the heels, she has had flexion contractures of the knees. She is not consistently swallowing her food, at times she will hold the food in the mouth. The patient seems to recognize her husband and her daughter. She may not recognize past friends coming in. She returns to this office for an evaluation.   REVIEW OF SYSTEMS: Out of a complete 14 system review of symptoms, the patient complains only of the following symptoms, and all other reviewed systems are negative.  Nervous/anxious, agitation  ALLERGIES: Allergies  Allergen Reactions  . Penicillins Other (See Comments)    Unsure of reaction    HOME MEDICATIONS: Outpatient Medications Prior to Visit  Medication Sig Dispense Refill  . acetaminophen (TYLENOL) 325 MG tablet Take 650 mg by mouth every 6 (six) hours as needed for moderate pain.    [name] ALPRAZolam  (XANAX) 0.25 MG tablet Take 1 tablet (0.25 mg total) by mouth at bedtime. 30 tablet   . apixaban (ELIQUIS) 2.5 MG TABS tablet Take 1 tablet (2.5 mg total) by mouth 2 (two) times daily. 60 tablet 60  . donepezil (ARICEPT) 5 MG tablet Take 1 tablet (5 mg total) by mouth at bedtime. 30 tablet 0  . ezetimibe (ZETIA) 10 MG tablet Take 10 mg by mouth daily. For hyperlipidemia    . glimepiride (AMARYL) 2 MG tablet Take 2 mg by mouth daily with breakfast. For diabetes    . insulin lispro (HUMALOG) 100 UNIT/ML injection Sliding scale dosing per CBG results BID 201-250=3 units, 251-350= 5 units, >351=call MD    . loratadine (CLARITIN) 10 MG tablet Take 10 mg by mouth daily.    . memantine (NAMENDA XR) 28 MG CP24 24 hr capsule Take 1 capsule (28 mg total) by mouth daily. 90 capsule 1  . mirtazapine (REMERON) 15 MG tablet Take 15 mg by mouth at bedtime.    . Multiple Vitamin (MULTIVITAMIN) tablet Take 1 tablet by mouth daily.    [name] NUTRITIONAL SUPPLEMENT LIQD Take 120 mLs by mouth 3 (three) times daily. MedPass due to weight loss    . Nutritional Supplements (NUTRITIONAL SUPPLEMENT PO) Take 1 each by mouth 2 (two) times daily. With lunch and dinner due to being underweight    . ondansetron (ZOFRAN) 4 MG tablet Take 1 tablet (4 mg total) by mouth every 8 (eight) hours as needed for nausea or vomiting. 20 tablet 0  . PARoxetine (PAXIL) 40  MG tablet Take 40 mg by mouth daily. For depression    . polyethylene glycol (MIRALAX / GLYCOLAX) packet Take 17 g by mouth daily. Hold for loose stools    . senna-docusate (SENNA PLUS) 8.6-50 MG tablet Take 1 tablet by mouth 2 (two) times daily.    [name] docusate sodium (COLACE) 100 MG capsule Take 1 capsule (100 mg total) by mouth 2 (two) times daily. (Patient not taking: Reported on 05/30/2016) 10 capsule 0  . diltiazem (DILACOR XR) 180 MG 24 hr capsule Take 1 capsule (180 mg total) by mouth daily. For Afib     No facility-administered medications prior to visit.     PAST  MEDICAL HISTORY: Past Medical History:  Diagnosis Date  . Abnormality of gait 05/15/2013  . Breast cancer ([place]) 1980   right  . Dementia   . Depression   . DM (diabetes mellitus) ([place])   . HTN (hypertension)   . Hypercholesterolemia   . Intrauterine pessary   . Memory difficulty 05/15/2013  . Osteoporosis   . TIA (transient ischemic attack)   . Wrist fracture 05/2011    PAST SURGICAL HISTORY: Past Surgical History:  Procedure Laterality Date  . INTRAMEDULLARY (IM) NAIL INTERTROCHANTERIC Left 08/27/2014   Procedure: INTRAMEDULLARY (IM) NAIL INTERTROCHANTRIC;  Surgeon: Renette Butters, MD;  Location: [facility];  Service: Orthopedics;  Laterality: Left;  [name] MASTECTOMY MODIFIED RADICAL Right    30 years ago    FAMILY HISTORY: Family History  Problem Relation Age of Onset  . Diabetes Father   . Cancer Father     leukemia  . Heart attack Brother   . Dementia Maternal Aunt     SOCIAL HISTORY: Social History   Social History  . Marital status: Married    Spouse name: [name]  . Number of children: 2  . Years of education: 9TH   Occupational History  . Retired    Social History Main Topics  . Smoking status: Never Smoker  . Smokeless tobacco: Never Used  . Alcohol use No  . Drug use: No  . Sexual activity: Not on file   Other Topics Concern  . Not on file   Social History Narrative   Lives at [facility] since 08/30/14   Patient is married to [employer]).   Retired.    Education high school.    Right handed.   Never smoked   Alcohol none   Full Code      PHYSICAL EXAM  Vitals:   05/30/16 1408  BP: (!) 135/92  Pulse: 99   There is no height or weight on file to calculate BMI.  Generalized: Well developed, in no acute distress   Neurological examination  Mentation: Alert. Follows all commands Intermittently. Most of the time speech is garbled but some words are very clear. Cranial nerve II-XII: Pupils were equal round reactive to light. Extraocular  movements were full, visual field were full on confrontational test. Facial sensation and strength were normal. Uvula tongue midline. Head turning and shoulder shrug  were normal and symmetric. Motor: The motor testing reveals 5 over 5 strength in the upper extremities. Sensory: Sensory testing is intact to soft touch on all 4 extremities. No evidence of extinction is noted.  Coordination: Unable to test. Gait and station: Patient is in a wheelchair.   DIAGNOSTIC DATA (LABS, IMAGING, TESTING) - I reviewed patient records, labs, notes, testing and imaging myself where available.  Lab Results  Component Value Date   WBC 11.4 02/23/2016  HGB 11.9 (A) 02/23/2016   HCT 36 02/23/2016   MCV 98.2 09/15/2014   PLT 333 02/23/2016      Component Value Date/Time   NA 144 02/23/2016   K 3.8 02/23/2016   CL 104 09/15/2014 1504   CO2 23 09/15/2014 1504   GLUCOSE 188 (H) 09/15/2014 1504   BUN 13 02/23/2016   CREATININE 0.4 (A) 02/23/2016   CREATININE 0.72 09/15/2014 1504   CALCIUM 9.3 09/15/2014 1504   ALBUMIN 3.5 08/25/2014 1537   AST 14 01/11/2015   ALT 15 01/11/2015   ALKPHOS 145 (A) 01/11/2015   GFRNONAA 77 09/15/2014 1504   GFRAA 89 09/15/2014 1504   Lab Results  Component Value Date   CHOL 189 07/23/2015   HDL 56 07/23/2015   LDLCALC 101 07/23/2015   TRIG 159 07/23/2015   CHOLHDL 3.9 08/26/2014   Lab Results  Component Value Date   HGBA1C 6.7 05/15/2016   No results found for: VITAMINB12 Lab Results  Component Value Date   TSH 0.457 08/25/2014      ASSESSMENT AND PLAN 83 y.o. year old female  has a past medical history of Abnormality of gait (05/15/2013); Breast cancer ([place]) (1980); Dementia; Depression; DM (diabetes mellitus) ([place]); HTN (hypertension); Hypercholesterolemia; Intrauterine pessary; Memory difficulty (05/15/2013); Osteoporosis; TIA (transient ischemic attack); and Wrist fracture (05/2011). here with:  1. Alzheimer's disease  Overall the patient has  remained stable. She will continue on Namenda XR 28 mg daily as well as Aricept 5 mg daily. Patient and husband are advised that if her symptoms worsen or she develops new symptoms she should let us know. She will follow-up in 6 months or sooner if needed.  I spent 15 minutes with the patient 50% of this time was spent discussing medication and diagnosis.    Ward Givens, MSN, NP-C 05/30/2016, 2:25 PM [facility] [address] [place], [place] [address] [phone]

## 2016-05-30 NOTE — Progress Notes (Signed)
I have read the note, and I agree with the clinical assessment and plan.  Takhia Spoon KEITH   

## 2016-06-07 NOTE — Progress Notes (Signed)
HPI: follow-up atrial fibrillation. Patient previously admitted after falling and fracturing hip. She was noted to be in atrial fibrillation. She was treated with Cardizem for rate control and apixaban. Echocardiogram July 2016 showed normal LV function and mild biatrial enlargement. Thyroid ultrasound showed nodules largest being 4.6 cm. Pt instructed to fu with primary care for this issue. Chest CT showed no pulmonary embolus. TSH and free T4 normal. Since last seen, she denies dyspnea, chest pain, palpitations or syncope. She has severe dementia and history is difficult.  Current Outpatient Prescriptions  Medication Sig Dispense Refill  . acetaminophen (TYLENOL) 325 MG tablet Take 650 mg by mouth every 6 (six) hours as needed for moderate pain.    [name] ALPRAZolam (XANAX) 0.25 MG tablet Take 1 tablet (0.25 mg total) by mouth at bedtime. 30 tablet   . apixaban (ELIQUIS) 2.5 MG TABS tablet Take 1 tablet (2.5 mg total) by mouth 2 (two) times daily. 60 tablet 60  . diltiazem (CARDIZEM CD) 180 MG 24 hr capsule 180 mg daily.    [name] Sodium (COLACE PO) Take 10 mLs by mouth 2 (two) times daily.    [name] donepezil (ARICEPT) 5 MG tablet Take 1 tablet (5 mg total) by mouth at bedtime. 30 tablet 0  . ezetimibe (ZETIA) 10 MG tablet Take 10 mg by mouth daily. For hyperlipidemia    . glimepiride (AMARYL) 2 MG tablet Take 2 mg by mouth daily with breakfast. For diabetes    . insulin lispro (HUMALOG) 100 UNIT/ML injection Sliding scale dosing per CBG results BID 201-250=3 units, 251-350= 5 units, >351=call MD    . loratadine (CLARITIN) 10 MG tablet Take 10 mg by mouth daily.    . memantine (NAMENDA XR) 28 MG CP24 24 hr capsule Take 1 capsule (28 mg total) by mouth daily. 90 capsule 1  . mirtazapine (REMERON) 15 MG tablet Take 7.5 mg by mouth daily.     . Multiple Vitamin (MULTIVITAMIN) tablet Take 1 tablet by mouth daily.    [name] NUTRITIONAL SUPPLEMENT LIQD Take 120 mLs by mouth 3 (three) times daily.  MedPass due to weight loss    . Nutritional Supplements (NUTRITIONAL SUPPLEMENT PO) Take 1 each by mouth 2 (two) times daily. With lunch and dinner due to being underweight    . ondansetron (ZOFRAN) 4 MG tablet Take 1 tablet (4 mg total) by mouth every 8 (eight) hours as needed for nausea or vomiting. 20 tablet 0  . PARoxetine (PAXIL) 40 MG tablet Take 40 mg by mouth daily. For depression    . polyethylene glycol (MIRALAX / GLYCOLAX) packet Take 17 g by mouth daily. Hold for loose stools    . senna-docusate (SENNA PLUS) 8.6-50 MG tablet Take 1 tablet by mouth 2 (two) times daily.     No current facility-administered medications for this visit.      Past Medical History:  Diagnosis Date  . Abnormality of gait 05/15/2013  . Breast cancer ([place]) 1980   right  . Dementia   . Depression   . DM (diabetes mellitus) ([place])   . HTN (hypertension)   . Hypercholesterolemia   . Intrauterine pessary   . Memory difficulty 05/15/2013  . Osteoporosis   . TIA (transient ischemic attack)   . Wrist fracture 05/2011    Past Surgical History:  Procedure Laterality Date  . INTRAMEDULLARY (IM) NAIL INTERTROCHANTERIC Left 08/27/2014   Procedure: INTRAMEDULLARY (IM) NAIL INTERTROCHANTRIC;  Surgeon: Renette Butters, MD;  Location: [facility];  Service: Orthopedics;  Laterality: Left;  [name] MASTECTOMY MODIFIED RADICAL Right    30 years ago    Social History   Social History  . Marital status: Married    Spouse name: [name]  . Number of children: 2  . Years of education: 9TH   Occupational History  . Retired    Social History Main Topics  . Smoking status: Never Smoker  . Smokeless tobacco: Never Used  . Alcohol use No  . Drug use: No  . Sexual activity: Not on file   Other Topics Concern  . Not on file   Social History Narrative   Lives at [facility] since 08/30/14   Patient is married to [place]).   Retired.    Education high school.    Right handed.   Never smoked   Alcohol none   Full Code     Family History  Problem Relation Age of Onset  . Diabetes Father   . Cancer Father     leukemia  . Heart attack Brother   . Dementia Maternal Aunt     ROS: difficult due to dementia; some dysphagia but no fevers or chills, productive cough, hemoptysis, melena, hematochezia, dysuria, hematuria, rash, seizure activity, orthopnea, PND, pedal edema. Remaining systems are negative.  Physical Exam: Well-developed frail in no acute distress.  Skin is warm and dry.  HEENT is normal.  Neck is supple.  Chest mildly diminished BS throughout Cardiovascular exam is irregular, tachycardic Abdominal exam nontender or distended. No masses palpated. Extremities show in braces. neuro severe dementia  ECG- atrial fibrillation with a rate of 116, nonspecific ST changes. Low voltage. personally reviewed  A/P  1 permanent atrial fibrillation-patient's heart rate remains elevated. We will continue increase cardizem to 240 mg daily and follow. Continue apixaban. Conservative measures given age and dementia.   2 hypertension-blood pressure controlled. Continue present medications.  3 significant dementia-managed by primary care.   Kirk Ruths, MD

## 2016-06-08 ENCOUNTER — Encounter: Payer: Self-pay | Admitting: Internal Medicine

## 2016-06-08 ENCOUNTER — Non-Acute Institutional Stay (SKILLED_NURSING_FACILITY): Payer: Medicare Other | Admitting: Internal Medicine

## 2016-06-08 DIAGNOSIS — E43 Unspecified severe protein-calorie malnutrition: Secondary | ICD-10-CM

## 2016-06-08 DIAGNOSIS — G301 Alzheimer's disease with late onset: Secondary | ICD-10-CM | POA: Diagnosis not present

## 2016-06-08 DIAGNOSIS — F3341 Major depressive disorder, recurrent, in partial remission: Secondary | ICD-10-CM | POA: Diagnosis not present

## 2016-06-08 DIAGNOSIS — E119 Type 2 diabetes mellitus without complications: Secondary | ICD-10-CM | POA: Diagnosis not present

## 2016-06-08 DIAGNOSIS — F0280 Dementia in other diseases classified elsewhere without behavioral disturbance: Secondary | ICD-10-CM

## 2016-06-08 NOTE — Progress Notes (Signed)
Patient ID: Brittney Powell, female   DOB: 02/20/1928, 83 y.o.   MRN: [id number]     Facility: [facility]    PCP: Blanchie Serve, MD  Code Status: full code  Allergies  Allergen Reactions  . Penicillins Other (See Comments)    Unsure of reaction    Chief Complaint  Patient presents with  . Medical Management of Chronic Issues    Routine Visit      HPI:  83 y.o. year old patient is seen for routine visit. She denies any concern. Her husband denies any concern. She is out of bed daily. Her oral intake has improved.   Review of Systems: per nursing and family Constitutional: Negative for fever HENT: Negative for headache Respiratory: Negative for cough, shortness of breath Cardiovascular: Negative for chest pain, palpitations.  Gastrointestinal: Negative for heartburn, nausea, vomiting, abdominal pain Genitourinary: Negative for dysuria Musculoskeletal: Negative for back pain. No fall  Skin: Negative for rash and pressure ulcer   Neurological: Negative for dizziness. Has dementia.     Past Medical History:  Diagnosis Date  . Abnormality of gait 05/15/2013  . Breast cancer ([place]) 1980   right  . Dementia   . Depression   . DM (diabetes mellitus) ([place])   . HTN (hypertension)   . Hypercholesterolemia   . Intrauterine pessary   . Memory difficulty 05/15/2013  . Osteoporosis   . TIA (transient ischemic attack)   . Wrist fracture 05/2011    Medications: Patient's Medications  New Prescriptions   No medications on file  Previous Medications   ACETAMINOPHEN (TYLENOL) 325 MG TABLET    Take 650 mg by mouth every 6 (six) hours as needed for moderate pain.   ALPRAZOLAM (XANAX) 0.25 MG TABLET    Take 1 tablet (0.25 mg total) by mouth at bedtime.   APIXABAN (ELIQUIS) 2.5 MG TABS TABLET    Take 1 tablet (2.5 mg total) by mouth 2 (two) times daily.   DILTIAZEM (CARDIZEM CD) 180 MG 24 HR CAPSULE    180 mg daily.   DOCUSATE SODIUM (COLACE PO)    Take  10 mLs by mouth 2 (two) times daily.   DONEPEZIL (ARICEPT) 5 MG TABLET    Take 1 tablet (5 mg total) by mouth at bedtime.   EZETIMIBE (ZETIA) 10 MG TABLET    Take 10 mg by mouth daily. For hyperlipidemia   GLIMEPIRIDE (AMARYL) 2 MG TABLET    Take 2 mg by mouth daily with breakfast. For diabetes   INSULIN LISPRO (HUMALOG) 100 UNIT/ML INJECTION    Sliding scale dosing per CBG results BID 201-250=3 units, 251-350= 5 units, >351=call MD   LORATADINE (CLARITIN) 10 MG TABLET    Take 10 mg by mouth daily.   MEMANTINE (NAMENDA XR) 28 MG CP24 24 HR CAPSULE    Take 1 capsule (28 mg total) by mouth daily.   MIRTAZAPINE (REMERON) 15 MG TABLET    Take 7.5 mg by mouth daily.    MULTIPLE VITAMIN (MULTIVITAMIN) TABLET    Take 1 tablet by mouth daily.   NUTRITIONAL SUPPLEMENT LIQD    Take 120 mLs by mouth 3 (three) times daily. MedPass due to weight loss   NUTRITIONAL SUPPLEMENTS (NUTRITIONAL SUPPLEMENT PO)    Take 1 each by mouth 2 (two) times daily. With lunch and dinner due to being underweight   ONDANSETRON (ZOFRAN) 4 MG TABLET    Take 1 tablet (4 mg total) by mouth every 8 (eight) hours as needed  for nausea or vomiting.   PAROXETINE (PAXIL) 40 MG TABLET    Take 40 mg by mouth daily. For depression   POLYETHYLENE GLYCOL (MIRALAX / GLYCOLAX) PACKET    Take 17 g by mouth daily. Hold for loose stools   SENNA-DOCUSATE (SENNA PLUS) 8.6-50 MG TABLET    Take 1 tablet by mouth 2 (two) times daily.  Modified Medications   No medications on file  Discontinued Medications   DOCUSATE SODIUM (COLACE) 100 MG CAPSULE    Take 1 capsule (100 mg total) by mouth 2 (two) times daily.     Physical Exam: Vitals:   06/08/16 1317  BP: 124/89  Pulse: 76  Resp: 20  Temp: 99.3 F (37.4 C)  TempSrc: Oral  SpO2: 97%  Weight: 96 lb 6.4 oz (43.7 kg)  Height: 5\' 6"  (1.676 m)  Body mass index is 15.56 kg/m.  Wt Readings from Last 3 Encounters:  06/08/16 96 lb 6.4 oz (43.7 kg)  04/24/16 95 lb 6.4 oz (43.3 kg)  03/27/16 95  lb (43.1 kg)    General- elderly female, frail and thin built, in no acute distress Head- normocephalic, atraumatic Throat- moist mucus membrane Eyes- no pallor, no icterus, no discharge Neck- no cervical lymphadenopathy Cardiovascular- irregular heart rate, no murmur, no leg edema Respiratory- bilateral clear to auscultation, no wheeze, no rhonchi, no crackles Abdomen- bowel sounds present, soft, non tender Musculoskeletal- able to move her upper extremities, contracture to her legs and has brace on, sitting on a wheelchair Skin- warm and dry Neurological- alert and oriented to person only Psychiatry- normal affect   Labs reviewed: Basic Metabolic Panel:  Recent Labs  08/23/15 02/16/16 02/23/16  NA 140  140 143 144  K 3.8  3.8 3.9 3.8  BUN 19  19 27* 13  CREATININE 0.5  0.5 0.5 0.4*   Liver Function Tests: No results for input(s): AST, ALT, ALKPHOS, BILITOT, PROT, ALBUMIN in the last 8760 hours. No results for input(s): LIPASE, AMYLASE in the last 8760 hours. No results for input(s): AMMONIA in the last 8760 hours. CBC:  Recent Labs  08/23/15 02/16/16 02/23/16  WBC 10.0 8.5 11.4  HGB 13.0 13.1 11.9*  HCT 40 39 36  PLT 357 291 333    Lab Results  Component Value Date   HGBA1C 6.7 05/15/2016   Lab Results  Component Value Date   TSH 0.457 08/25/2014      Assessment/Plan  Severe protein calorie malnutrition Wt Readings from Last 3 Encounters:  06/08/16 96 lb 6.4 oz (43.7 kg)  04/24/16 95 lb 6.4 oz (43.3 kg)  03/27/16 95 lb (43.1 kg)   Low but stable, reviewed benefits vs risk of aricept and daughter would like aricept to be continued for now. Oral intake has improved per family and nursing. Monitor  alzheimer's Dementia Mixed with alzheimer's and vascular component. Provide supportive care. Currently under total care. Also on donepezil and namenda xr per family request  Chronic depression Tolerating reduced dosing of remeron well. Continue this and  paroxetine.  Type 2 DM without complications Lab Results  Component Value Date   HGBA1C 6.7 05/15/2016   cbg mostly below 150 with few in 80s. 2 readings in 200s. Currently on glimepiride 2 mg daily with SSI humalog. Decrease glimepiride to 1 mg daily. Check urine microalbumin. Check lipid panel.  Lipid Panel     Component Value Date/Time   CHOL 189 07/23/2015   TRIG 159 07/23/2015   HDL 56 07/23/2015   CHOLHDL 3.9  08/26/2014 0225   VLDL 21 08/26/2014 0225   LDLCALC 101 07/23/2015      Blanchie Serve, MD Internal Medicine [facility] [address] [place], [place] [address] Cell Phone (Monday-Friday 8 am - 5 pm): [phone] On Call: [phone] and follow prompts after 5 pm and on weekends Office Phone: [phone] Office Fax: [phone]

## 2016-06-09 LAB — MICROALBUMIN, URINE: Microalb, Ur: 23.3

## 2016-06-12 LAB — LIPID PANEL
Cholesterol: 190 mg/dL (ref 0–200)
HDL: 52 mg/dL (ref 35–70)
LDL CALC: 108 mg/dL
TRIGLYCERIDES: 153 mg/dL (ref 40–160)

## 2016-06-15 ENCOUNTER — Ambulatory Visit (INDEPENDENT_AMBULATORY_CARE_PROVIDER_SITE_OTHER): Payer: Medicare Other | Admitting: Cardiology

## 2016-06-15 ENCOUNTER — Encounter: Payer: Self-pay | Admitting: Cardiology

## 2016-06-15 VITALS — BP 124/59 | HR 116 | Ht 66.0 in

## 2016-06-15 DIAGNOSIS — I4891 Unspecified atrial fibrillation: Secondary | ICD-10-CM | POA: Diagnosis not present

## 2016-06-15 DIAGNOSIS — I10 Essential (primary) hypertension: Secondary | ICD-10-CM

## 2016-06-15 MED ORDER — DILTIAZEM HCL ER COATED BEADS 240 MG PO CP24: 240.0000 mg | ORAL_CAPSULE | Freq: Every day | ORAL | Status: AC

## 2016-06-15 NOTE — Patient Instructions (Signed)
Medication Instructions:   INCREASE DILTIAZEM TO 240 MG ONCE DAILY  Follow-Up:  Your physician wants you to follow-up in: [place] will receive a reminder letter in the mail two months in advance. If you don't receive a letter, please call our office to schedule the follow-up appointment.   If you need a refill on your cardiac medications before your next appointment, please call your pharmacy.

## 2016-06-16 NOTE — Addendum Note (Signed)
Addended by: Zebedee Iba on: 06/16/2016 02:11 PM   Modules accepted: Orders

## 2016-06-28 ENCOUNTER — Encounter: Payer: Self-pay | Admitting: Family

## 2016-06-28 ENCOUNTER — Non-Acute Institutional Stay (SKILLED_NURSING_FACILITY): Payer: Medicare Other | Admitting: Family

## 2016-06-28 DIAGNOSIS — K117 Disturbances of salivary secretion: Secondary | ICD-10-CM

## 2016-06-28 DIAGNOSIS — E1100 Type 2 diabetes mellitus with hyperosmolarity without nonketotic hyperglycemic-hyperosmolar coma (NKHHC): Secondary | ICD-10-CM | POA: Diagnosis not present

## 2016-06-28 DIAGNOSIS — Z794 Long term (current) use of insulin: Secondary | ICD-10-CM

## 2016-06-28 NOTE — Progress Notes (Signed)
Location:  [place] Room Number: 303A Place of Service:  SNF (31) Provider: Matthe Sloane FNP-C   Blanchie Serve, MD  Patient Care Team: Blanchie Serve, MD as PCP - General (Internal Medicine)  Extended Emergency Contact Information Primary Emergency Contact: Laiba, Fuerte Address: [place] [address] [place] Phone: [phone] Relation: Spouse Secondary Emergency Contact: Rozo,David Address: [address]          [place], [place] [address] [place] Phone: [phone] Mobile Phone: [phone] Relation: Son  Code Status:  Full Code  Goals of care: Advanced Directive information Advanced Directives 06/28/2016  Does Patient Have a Medical Advance Directive? No  Type of Advance Directive -  Would patient like information on creating a medical advance directive? -     Chief Complaint  Patient presents with  . Acute Visit    sleepy all the time    HPI:  Pt is a 83 y.o. female seen today at [facility] for evaluation of acute issues. She has a significant medical history of advance dementia, Type 2 DM  among other conditions. She is seen in her room today with husband at bedside. Patient husband states patient has been sleepy most of the time. Also states found patient with dry secretion on the side of the mouth this morning after breakfast.Facility staff reports no new concerns.   Past Medical History:  Diagnosis Date  . Abnormality of gait 05/15/2013  . Breast cancer ([place]) 1980   right  . Dementia   . Depression   . DM (diabetes mellitus) ([place])   . HTN (hypertension)   . Hypercholesterolemia   . Intrauterine pessary   . Memory difficulty 05/15/2013  . Osteoporosis   . TIA (transient ischemic attack)   . Wrist fracture 05/2011   Past Surgical History:  Procedure Laterality Date  . INTRAMEDULLARY (IM) NAIL INTERTROCHANTERIC Left 08/27/2014   Procedure: INTRAMEDULLARY (IM) NAIL INTERTROCHANTRIC;  Surgeon: Renette Butters, MD;  Location: [place];  Service: Orthopedics;  Laterality: Left;  [name] MASTECTOMY MODIFIED RADICAL Right    30 years ago    Allergies  Allergen Reactions  . Penicillins Other (See Comments)    Unsure of reaction    Allergies as of 06/28/2016      Reactions   Penicillins Other (See Comments)   Unsure of reaction      Medication List       Accurate as of 06/28/16  5:58 PM. Always use your most recent med list.          acetaminophen 325 MG tablet Commonly known as:  TYLENOL Take 650 mg by mouth every 6 (six) hours as needed for moderate pain.   ALPRAZolam 0.25 MG tablet Commonly known as:  XANAX Take 1 tablet (0.25 mg total) by mouth at bedtime.   apixaban 2.5 MG Tabs tablet Commonly known as:  ELIQUIS Take 1 tablet (2.5 mg total) by mouth 2 (two) times daily.   COLACE PO Take 10 mLs by mouth 2 (two) times daily.   diltiazem 240 MG 24 hr capsule Commonly known as:  CARDIZEM CD Take 1 capsule (240 mg total) by mouth daily.   donepezil 5 MG tablet Commonly known as:  ARICEPT Take 1 tablet (5 mg total) by mouth at bedtime.   ezetimibe 10 MG tablet Commonly known as:  ZETIA Take 10 mg by mouth  daily. For hyperlipidemia   glimepiride 1 MG tablet Commonly known as:  AMARYL Take 1 mg by mouth daily with breakfast.   HUMALOG 100 UNIT/ML injection Generic drug:  insulin lispro Sliding scale dosing per CBG results BID 201-250=3 units, 251-350= 5 units, >351=call MD   loratadine 10 MG tablet Commonly known as:  CLARITIN Take 10 mg by mouth daily.   memantine 28 MG Cp24 24 hr capsule Commonly known as:  NAMENDA XR Take 1 capsule (28 mg total) by mouth daily.   mirtazapine 15 MG tablet Commonly known as:  REMERON Take 7.5 mg by mouth daily.   multivitamin tablet Take 1 tablet by mouth daily.   NUTRITIONAL SUPPLEMENT PO Take 1 each by mouth 2 (two) times daily. With lunch and dinner  due to being underweight (Magic Cup)   NUTRITIONAL SUPPLEMENT Liqd Take 120 mLs by mouth 3 (three) times daily. MedPass due to weight loss   ondansetron 4 MG tablet Commonly known as:  ZOFRAN Take 1 tablet (4 mg total) by mouth every 8 (eight) hours as needed for nausea or vomiting.   PARoxetine 40 MG tablet Commonly known as:  PAXIL Take 40 mg by mouth daily. For depression   polyethylene glycol packet Commonly known as:  MIRALAX / GLYCOLAX Take 17 g by mouth daily. Hold for loose stools   SENNA PLUS 8.6-50 MG tablet Generic drug:  senna-docusate Take 1 tablet by mouth 2 (two) times daily.       Review of Systems  Unable to perform ROS: Dementia    Immunization History  Administered Date(s) Administered  . Influenza-Unspecified 12/04/2014, 12/08/2015  . PPD Test 08/30/2014, 09/13/2014, 10/28/2014   Pertinent  Health Maintenance Due  Topic Date Due  . PNA vac Low Risk Adult (1 of 2 - PCV13) 09/22/1994  . DEXA SCAN  03/26/2023 (Originally 09/22/1994)  . URINE MICROALBUMIN  07/23/2016  . INFLUENZA VACCINE  09/20/2016  . HEMOGLOBIN A1C  11/15/2016  . FOOT EXAM  03/22/2017  . OPHTHALMOLOGY EXAM  05/30/2017      Vitals:   06/28/16 0852  BP: 120/68  Pulse: 85  Resp: 18  Temp: 97 F (36.1 C)  SpO2: 97%  Weight: 95 lb 12.8 oz (43.5 kg)  Height: 5\' 6"  (1.676 m)   Body mass index is 15.46 kg/m. Physical Exam  Constitutional: She appears well-developed.  Thin frail elderly   HENT:  Head: Normocephalic.  Mouth/Throat: Oropharynx is clear and moist. No oropharyngeal exudate.  Dry food content noted on corner of the mouth and lips.   Eyes: Conjunctivae and EOM are normal. Pupils are equal, round, and reactive to light. Right eye exhibits no discharge. Left eye exhibits no discharge. No scleral icterus.  Neck: Neck supple. No JVD present. No tracheal deviation present.  Cardiovascular: Intact distal pulses.  Exam reveals no gallop and no friction rub.   No murmur  heard. HR irregular   Pulmonary/Chest: Effort normal and breath sounds normal. No respiratory distress. She has no wheezes. She has no rales.  Abdominal: Soft. Bowel sounds are normal. She exhibits no distension and no mass. There is no tenderness. There is no rebound and no guarding.  Genitourinary:  Genitourinary Comments:  incontinent  Musculoskeletal: She exhibits no edema, tenderness or deformity.  Wheelchair bound. LLE stiffness and contracture splints in place.   Lymphadenopathy:    She has no cervical adenopathy.  Neurological: She is alert.   pleasantly confused at baseline  Skin: Skin is warm and dry. No rash  noted. No erythema. No pallor.      Psychiatric: She has a normal mood and affect.    Labs reviewed:  Recent Labs  08/23/15 02/16/16 02/23/16  NA 140  140 143 144  K 3.8  3.8 3.9 3.8  BUN 19  19 27* 13  CREATININE 0.5  0.5 0.5 0.4*    Recent Labs  08/23/15 02/16/16 02/23/16  WBC 10.0 8.5 11.4  HGB 13.0 13.1 11.9*  HCT 40 39 36  PLT 357 291 333   Lab Results  Component Value Date   TSH 0.457 08/25/2014   Lab Results  Component Value Date   HGBA1C 6.7 05/15/2016   Lab Results  Component Value Date   CHOL 190 06/12/2016   HDL 52 06/12/2016   LDLCALC 108 06/12/2016   TRIG 153 06/12/2016   CHOLHDL 3.9 08/26/2014   Assessment/Plan Type 2 DM CBG's ranging in the upper 200's.low calories snacks and sugar free drinks discussed with patient's daughter.Continue on Humalog per SSI.discontinue Glimepiride then start Lantus 100 units/ml inject 8 units SQ at bedtime. Continue to  Monitor.  Drooling    Dry food noted on corner of the lips.Will consult SLP for swallowing evaluation.   Family/ staff Communication: Reviewed plan of care with patient's Husband and facility Nurse.    Labs/tests ordered: None

## 2016-07-03 ENCOUNTER — Encounter: Payer: Self-pay | Admitting: Family

## 2016-07-03 ENCOUNTER — Non-Acute Institutional Stay (SKILLED_NURSING_FACILITY): Payer: Medicare Other | Admitting: Family

## 2016-07-03 DIAGNOSIS — F411 Generalized anxiety disorder: Secondary | ICD-10-CM | POA: Diagnosis not present

## 2016-07-03 DIAGNOSIS — R630 Anorexia: Secondary | ICD-10-CM

## 2016-07-03 DIAGNOSIS — E782 Mixed hyperlipidemia: Secondary | ICD-10-CM

## 2016-07-03 DIAGNOSIS — I482 Chronic atrial fibrillation, unspecified: Secondary | ICD-10-CM

## 2016-07-03 NOTE — Progress Notes (Signed)
Location:  [place] Room Number: 303A Place of Service:  SNF (31) Provider: Aarin Bluett FNP-C   Blanchie Serve, MD  Patient Care Team: Blanchie Serve, MD as PCP - General (Internal Medicine)  Extended Emergency Contact Information Primary Emergency Contact: Samya, Siciliano Address: [place] [address] [place] Phone: [phone] Relation: Spouse Secondary Emergency Contact: Rutkowski,David Address: [address]          [place], [place] [address] [place] of [place] Phone: [phone] Mobile Phone: [phone] Relation: Son  Code Status: Full Code  Goals of care: Advanced Directive information Advanced Directives 07/03/2016  Does Patient Have a Medical Advance Directive? No  Type of Advance Directive -  Would patient like information on creating a medical advance directive? -     Chief Complaint  Patient presents with  . Medical Management of Chronic Issues    Routine visit    HPI:  Pt is a 83 y.o. female seen today [facility] for medical management of chronic diseases. She has a medical history of type 2 DM, HTN, Depression, anxiety, Hyperlipidemia among other conditions. She is seen in her room with Husband at bedside.Patient's husband states she didn't eat well last night and this morning but has been drinking fluid and supplements.She is currently on Cipro for recent urinary tract infection.      Past Medical History:  Diagnosis Date  . Abnormality of gait 05/15/2013  . Breast cancer ([place]) 1980   right  . Dementia   . Depression   . DM (diabetes mellitus) ([place])   . HTN (hypertension)   . Hypercholesterolemia   . Intrauterine pessary   . Memory difficulty 05/15/2013  . Osteoporosis   . TIA (transient ischemic attack)   . Wrist fracture 05/2011   Past Surgical History:  Procedure Laterality Date  . INTRAMEDULLARY (IM) NAIL INTERTROCHANTERIC  Left 08/27/2014   Procedure: INTRAMEDULLARY (IM) NAIL INTERTROCHANTRIC;  Surgeon: Renette Butters, MD;  Location: [place];  Service: Orthopedics;  Laterality: Left;  [name] MASTECTOMY MODIFIED RADICAL Right    30 years ago    Allergies  Allergen Reactions  . Penicillins Other (See Comments)    Unsure of reaction    Allergies as of 07/03/2016      Reactions   Penicillins Other (See Comments)   Unsure of reaction      Medication List       Accurate as of 07/03/16  3:22 PM. Always use your most recent med list.          acetaminophen 325 MG tablet Commonly known as:  TYLENOL Take 650 mg by mouth every 6 (six) hours as needed for moderate pain.   ALPRAZolam 0.25 MG tablet Commonly known as:  XANAX Take 1 tablet (0.25 mg total) by mouth at bedtime.   apixaban 2.5 MG Tabs tablet Commonly known as:  ELIQUIS Take 1 tablet (2.5 mg total) by mouth 2 (two) times daily.   ciprofloxacin 500 MG tablet Commonly known as:  CIPRO Take 500 mg by mouth daily with breakfast.   COLACE PO Take 10 mLs by mouth 2 (two) times daily.   diltiazem 240 MG 24 hr capsule Commonly known as:  CARDIZEM CD Take 1 capsule (240 mg total) by mouth daily.   donepezil 5 MG tablet Commonly known as:  ARICEPT Take 1 tablet (5 mg total) by mouth at bedtime.  ezetimibe 10 MG tablet Commonly known as:  ZETIA Take 10 mg by mouth daily. For hyperlipidemia   HUMALOG 100 UNIT/ML injection Generic drug:  insulin lispro Sliding scale dosing per CBG results BID 201-250=3 units, 251-350= 5 units, >351=call MD   LANTUS 100 UNIT/ML injection Generic drug:  insulin glargine Inject 8 Units into the skin at bedtime.   loratadine 10 MG tablet Commonly known as:  CLARITIN Take 10 mg by mouth daily.   memantine 28 MG Cp24 24 hr capsule Commonly known as:  NAMENDA XR Take 1 capsule (28 mg total) by mouth daily.   mirtazapine 15 MG tablet Commonly known as:  REMERON Take 7.5 mg by mouth daily.   multivitamin  tablet Take 1 tablet by mouth daily.   NUTRITIONAL SUPPLEMENT PO Take 1 each by mouth 2 (two) times daily. With lunch and dinner due to being underweight (Magic Cup)   NUTRITIONAL SUPPLEMENT Liqd Take 120 mLs by mouth 3 (three) times daily. MedPass due to weight loss   ondansetron 4 MG tablet Commonly known as:  ZOFRAN Take 1 tablet (4 mg total) by mouth every 8 (eight) hours as needed for nausea or vomiting.   PARoxetine 40 MG tablet Commonly known as:  PAXIL Take 40 mg by mouth daily. For depression   polyethylene glycol packet Commonly known as:  MIRALAX / GLYCOLAX Take 17 g by mouth daily. Hold for loose stools   saccharomyces boulardii 250 MG capsule Commonly known as:  FLORASTOR Take 250 mg by mouth 2 (two) times daily.   SENNA PLUS 8.6-50 MG tablet Generic drug:  senna-docusate Take 1 tablet by mouth 2 (two) times daily.       Review of Systems  Unable to perform ROS: Dementia    Immunization History  Administered Date(s) Administered  . Influenza-Unspecified 12/04/2014, 12/08/2015  . PPD Test 08/30/2014, 09/13/2014, 10/28/2014   Pertinent  Health Maintenance Due  Topic Date Due  . PNA vac Low Risk Adult (1 of 2 - PCV13) 09/22/1994  . DEXA SCAN  03/26/2023 (Originally 09/22/1994)  . INFLUENZA VACCINE  09/20/2016  . HEMOGLOBIN A1C  11/15/2016  . FOOT EXAM  03/22/2017  . OPHTHALMOLOGY EXAM  05/30/2017  . URINE MICROALBUMIN  06/09/2017    Vitals:   07/03/16 1121  BP: 128/72  Pulse: 81  Resp: 16  Temp: (!) 96 F (35.6 C)  SpO2: 93%  Weight: 95 lb 12.8 oz (43.5 kg)  Height: 5\' 6"  (1.676 m)   Body mass index is 15.46 kg/m. Physical Exam  Constitutional: She appears well-developed.  Thin frail elderly   HENT:  Head: Normocephalic.  Mouth/Throat: Oropharynx is clear and moist. No oropharyngeal exudate.  Eyes: Conjunctivae and EOM are normal. Pupils are equal, round, and reactive to light. Right eye exhibits no discharge. Left eye exhibits no  discharge. No scleral icterus.  Neck: Neck supple. No JVD present. No tracheal deviation present.  Cardiovascular: Intact distal pulses.  Exam reveals no gallop and no friction rub.   No murmur heard. HR irregular   Pulmonary/Chest: Effort normal and breath sounds normal. No respiratory distress. She has no wheezes. She has no rales.  Abdominal: Soft. Bowel sounds are normal. She exhibits no distension and no mass. There is no tenderness. There is no rebound and no guarding.  Genitourinary:  Genitourinary Comments:  incontinent  Musculoskeletal: She exhibits no edema, tenderness or deformity.   LLE stiffness and contracture splints in place.wheelchair bound    Lymphadenopathy:    She has no  cervical adenopathy.  Neurological: She is alert.  confused at baseline  Skin: Skin is warm and dry. No rash noted. No erythema. No pallor.  Skin intact     Psychiatric: She has a normal mood and affect.    Labs reviewed:  Recent Labs  08/23/15 02/16/16 02/23/16  NA 140  140 143 144  K 3.8  3.8 3.9 3.8  BUN 19  19 27* 13  CREATININE 0.5  0.5 0.5 0.4*    Recent Labs  08/23/15 02/16/16 02/23/16  WBC 10.0 8.5 11.4  HGB 13.0 13.1 11.9*  HCT 40 39 36  PLT 357 291 333   Lab Results  Component Value Date   TSH 0.457 08/25/2014   Lab Results  Component Value Date   HGBA1C 6.7 05/15/2016   Lab Results  Component Value Date   CHOL 190 06/12/2016   HDL 52 06/12/2016   LDLCALC 108 06/12/2016   TRIG 153 06/12/2016   CHOLHDL 3.9 08/26/2014   Assessment/Plan 1. Chronic atrial fibrillation  HR irregular.continue on Eliquis 2.5 mg Tablet twice daily.monitor for signs of bleeding.    2. Mixed hyperlipidemia Continue on Zetia 10 mg Tablet. Monitor lipid panel.   3. Anxiety state Stable. Continue on alprazolam.   4. Poor oral intake Currently on oral antibiotics for UTI. Continue to encourage fluid intake.   Family/ staff Communication: Reviewed plan of care with patient and  facility Nurse supervisor   Labs/tests ordered: None   Sandrea Hughs, NP

## 2016-10-24 ENCOUNTER — Other Ambulatory Visit: Payer: Self-pay | Admitting: Family Medicine

## 2016-10-24 DIAGNOSIS — R9389 Abnormal findings on diagnostic imaging of other specified body structures: Secondary | ICD-10-CM

## 2016-11-24 ENCOUNTER — Other Ambulatory Visit: Payer: Medicare Other

## 2016-11-29 ENCOUNTER — Ambulatory Visit
Admission: RE | Admit: 2016-11-29 | Discharge: 2016-11-29 | Disposition: A | Discharge status: Home / Self Care | Payer: Medicare Other | Source: Ambulatory Visit | Attending: Family Medicine | Admitting: Family Medicine

## 2016-11-29 DIAGNOSIS — R9389 Abnormal findings on diagnostic imaging of other specified body structures: Secondary | ICD-10-CM

## 2016-12-05 ENCOUNTER — Ambulatory Visit (INDEPENDENT_AMBULATORY_CARE_PROVIDER_SITE_OTHER): Payer: Medicare Other | Admitting: Adult Health

## 2016-12-05 ENCOUNTER — Encounter: Payer: Self-pay | Admitting: Adult Health

## 2016-12-05 VITALS — BP 113/74 | HR 78 | Ht 66.0 in

## 2016-12-05 DIAGNOSIS — F0390 Unspecified dementia without behavioral disturbance: Secondary | ICD-10-CM | POA: Diagnosis not present

## 2016-12-05 NOTE — Patient Instructions (Signed)
Your Plan:  Continue Aricept and namenda If your symptoms worsen or you develop new symptoms please let us know.   Thank you for coming to see [place] at [facility]. I hope we have been able to provide you high quality care today.  You may receive a patient satisfaction survey over the next few weeks. We would appreciate your feedback and comments so that we may continue to improve ourselves and the health of our patients.

## 2016-12-05 NOTE — Progress Notes (Signed)
I have read the note, and I agree with the clinical assessment and plan.  [name],CHARLES KEITH   

## 2016-12-05 NOTE — Progress Notes (Signed)
PATIENT: Brittney Powell DOB: 11/08/1927  REASON FOR VISIT: follow up HISTORY FROM: patient  HISTORY OF PRESENT ILLNESS: Today 12/05/16 Brittney Powell is an 83 year old female with a history of Alzheimer's disease. She returns today for follow-up. She continues to recite at [facility] place. She requires assistance with all ADLs. The patient is limited in her mobility. She requires full assistance with transfers. Her husband states that she is developing some contractures due to her immobility. He states that there are times that she is able to verbalize words however most of the time she mumbles. He reports that she continues to be pleasant. Returns today for an evaluation.  HISTORY 05/30/16: Brittney Powell is an 83 year old female with a history of Alzheimer's disease. She returns today for follow-up. She is here with her husband. She continues to reside at [facility] place. She requires assistance with all ADLs. Her husband feeds her every single day. He states that her appetite is good and she is eating well. He states that there are some days that she is more interactive in conversation and others. He states that she is sleeping well. Denies any changes in her mood or behavior. Denies agitation or aggressiveness. Overall he feels that she has remained stable. She returns today for an evaluation  REVIEW OF SYSTEMS: Out of a complete 14 system review of symptoms, the patient complains only of the following symptoms, and all other reviewed systems are negative.  ALLERGIES: Allergies  Allergen Reactions  . Penicillins Other (See Comments)    Unsure of reaction    HOME MEDICATIONS: Outpatient Medications Prior to Visit  Medication Sig Dispense Refill  . acetaminophen (TYLENOL) 325 MG tablet Take 650 mg by mouth every 6 (six) hours as needed for moderate pain.    [name] ALPRAZolam (XANAX) 0.25 MG tablet Take 1 tablet (0.25 mg total) by mouth at bedtime. 30 tablet   . apixaban (ELIQUIS) 2.5 MG TABS tablet Take 1  tablet (2.5 mg total) by mouth 2 (two) times daily. 60 tablet 60  . diltiazem (CARDIZEM CD) 240 MG 24 hr capsule Take 1 capsule (240 mg total) by mouth daily.    [name] Sodium (COLACE PO) Take 10 mLs by mouth 2 (two) times daily.    [name] donepezil (ARICEPT) 5 MG tablet Take 1 tablet (5 mg total) by mouth at bedtime. 30 tablet 0  . ezetimibe (ZETIA) 10 MG tablet Take 10 mg by mouth daily. For hyperlipidemia    . insulin glargine (LANTUS) 100 UNIT/ML injection Inject 8 Units into the skin at bedtime.    . insulin lispro (HUMALOG) 100 UNIT/ML injection Sliding scale dosing per CBG results BID 201-250=3 units, 251-350= 5 units, >351=call MD    . loratadine (CLARITIN) 10 MG tablet Take 10 mg by mouth daily.    . memantine (NAMENDA XR) 28 MG CP24 24 hr capsule Take 1 capsule (28 mg total) by mouth daily. 90 capsule 1  . mirtazapine (REMERON) 15 MG tablet Take 7.5 mg by mouth daily.     . Multiple Vitamin (MULTIVITAMIN) tablet Take 1 tablet by mouth daily.    [name] NUTRITIONAL SUPPLEMENT LIQD Take 120 mLs by mouth 3 (three) times daily. MedPass due to weight loss    . Nutritional Supplements (NUTRITIONAL SUPPLEMENT PO) Take 1 each by mouth 2 (two) times daily. With lunch and dinner due to being underweight [employer])    . ondansetron (ZOFRAN) 4 MG tablet Take 1 tablet (4 mg total) by mouth every 8 (eight) hours  as needed for nausea or vomiting. 20 tablet 0  . PARoxetine (PAXIL) 40 MG tablet Take 40 mg by mouth daily. For depression    . polyethylene glycol (MIRALAX / GLYCOLAX) packet Take 17 g by mouth daily. Hold for loose stools    . senna-docusate (SENNA PLUS) 8.6-50 MG tablet Take 1 tablet by mouth 2 (two) times daily.     No facility-administered medications prior to visit.     PAST MEDICAL HISTORY: Past Medical History:  Diagnosis Date  . Abnormality of gait 05/15/2013  . Breast cancer ([place]) 1980   right  . Dementia   . Depression   . DM (diabetes mellitus) ([place])   . HTN (hypertension)   .  Hypercholesterolemia   . Intrauterine pessary   . Memory difficulty 05/15/2013  . Osteoporosis   . TIA (transient ischemic attack)   . Wrist fracture 05/2011    PAST SURGICAL HISTORY: Past Surgical History:  Procedure Laterality Date  . INTRAMEDULLARY (IM) NAIL INTERTROCHANTERIC Left 08/27/2014   Procedure: INTRAMEDULLARY (IM) NAIL INTERTROCHANTRIC;  Surgeon: Renette Butters, MD;  Location: [place];  Service: Orthopedics;  Laterality: Left;  [name] MASTECTOMY MODIFIED RADICAL Right    30 years ago    FAMILY HISTORY: Family History  Problem Relation Age of Onset  . Diabetes Father   . Cancer Father        leukemia  . Heart attack Brother   . Dementia Maternal Aunt     SOCIAL HISTORY: Social History   Social History  . Marital status: Married    Spouse name: [name]  . Number of children: 2  . Years of education: 9TH   Occupational History  . Retired    Social History Main Topics  . Smoking status: Never Smoker  . Smokeless tobacco: Never Used  . Alcohol use No  . Drug use: No  . Sexual activity: Not on file   Other Topics Concern  . Not on file   Social History Narrative   [facility]   Patient is married to [place]).   Retired.    Education high school.    Right handed.   Never smoked   Alcohol none   Full Code      PHYSICAL EXAM  Vitals:   12/05/16 1413  BP: 113/74  Pulse: 78  Height: 5\' 6"  (1.676 m)   There is no height or weight on file to calculate BMI.  Generalized: Well developed, in no acute distress   Neurological examination  Mentation: Alert.Follows all commands. Speech is garbled. Cranial nerve II-XII: Pupils were equal round reactive to light. Extraocular movements were full, visual field were full on confrontational test. Facial sensation and strength were normal. Uvula tongue midline. Head turning and shoulder shrug  were normal and symmetric. Motor: 3 out of 5 strength in the upper extremities. Limited range of  motion in the upper extremities. No movement in the lower extremities. Sensory: Sensory testing is intact to soft touch on all 4 extremities. No evidence of extinction is noted.  Coordination: unable to complete.  Gait and station: in a wheelchair    DIAGNOSTIC DATA (LABS, IMAGING, TESTING) - I reviewed patient records, labs, notes, testing and imaging myself where available.  Lab Results  Component Value Date   WBC 11.4 02/23/2016   HGB 11.9 (A) 02/23/2016   HCT 36 02/23/2016   MCV 98.2 09/15/2014   PLT 333 02/23/2016      Component Value Date/Time  NA 144 02/23/2016   K 3.8 02/23/2016   CL 104 09/15/2014 1504   CO2 23 09/15/2014 1504   GLUCOSE 188 (H) 09/15/2014 1504   BUN 13 02/23/2016   CREATININE 0.4 (A) 02/23/2016   CREATININE 0.72 09/15/2014 1504   CALCIUM 9.3 09/15/2014 1504   ALBUMIN 3.5 08/25/2014 1537   AST 14 01/11/2015   ALT 15 01/11/2015   ALKPHOS 145 (A) 01/11/2015   GFRNONAA 77 09/15/2014 1504   GFRAA 89 09/15/2014 1504   Lab Results  Component Value Date   CHOL 190 06/12/2016   HDL 52 06/12/2016   LDLCALC 108 06/12/2016   TRIG 153 06/12/2016   CHOLHDL 3.9 08/26/2014   Lab Results  Component Value Date   HGBA1C 6.7 05/15/2016   No results found for: VITAMINB12 Lab Results  Component Value Date   TSH 0.457 08/25/2014      ASSESSMENT AND PLAN 83 y.o. year old female  has a past medical history of Abnormality of gait (05/15/2013); Breast cancer ([place]) (1980); Dementia; Depression; DM (diabetes mellitus) ([place]); HTN (hypertension); Hypercholesterolemia; Intrauterine pessary; Memory difficulty (05/15/2013); Osteoporosis; TIA (transient ischemic attack); and Wrist fracture (05/2011). here with:  1. Dementia  Unable to complete any MMSE today. The patient's husband would like for her to remain on Aricept and Namenda. The patient does require full assistance at [facility] place. I have advised that if she develops new symptoms she should let us know. She  will follow-up in 6 months or sooner if needed.    Ward Givens, MSN, NP-C 12/05/2016, 2:35 PM [facility] [address] [place], [place] [address] [phone]

## 2016-12-27 ENCOUNTER — Emergency Department (HOSPITAL_COMMUNITY): Payer: Medicare Other

## 2016-12-27 ENCOUNTER — Inpatient Hospital Stay (HOSPITAL_COMMUNITY)
Admission: EM | Admit: 2016-12-27 | Discharge: 2017-01-01 | DRG: 871 | Disposition: A | Discharge status: Hospice | Payer: Medicare Other | Attending: Family Medicine | Admitting: Family Medicine

## 2016-12-27 ENCOUNTER — Encounter (HOSPITAL_COMMUNITY): Payer: Self-pay

## 2016-12-27 DIAGNOSIS — E878 Other disorders of electrolyte and fluid balance, not elsewhere classified: Secondary | ICD-10-CM

## 2016-12-27 DIAGNOSIS — R627 Adult failure to thrive: Secondary | ICD-10-CM | POA: Diagnosis present

## 2016-12-27 DIAGNOSIS — F0390 Unspecified dementia without behavioral disturbance: Secondary | ICD-10-CM | POA: Diagnosis present

## 2016-12-27 DIAGNOSIS — Z7189 Other specified counseling: Secondary | ICD-10-CM

## 2016-12-27 DIAGNOSIS — E119 Type 2 diabetes mellitus without complications: Secondary | ICD-10-CM | POA: Diagnosis present

## 2016-12-27 DIAGNOSIS — Z806 Family history of leukemia: Secondary | ICD-10-CM

## 2016-12-27 DIAGNOSIS — R4182 Altered mental status, unspecified: Secondary | ICD-10-CM

## 2016-12-27 DIAGNOSIS — Z681 Body mass index (BMI) 19 or less, adult: Secondary | ICD-10-CM

## 2016-12-27 DIAGNOSIS — I10 Essential (primary) hypertension: Secondary | ICD-10-CM | POA: Diagnosis present

## 2016-12-27 DIAGNOSIS — F419 Anxiety disorder, unspecified: Secondary | ICD-10-CM | POA: Diagnosis present

## 2016-12-27 DIAGNOSIS — Z66 Do not resuscitate: Secondary | ICD-10-CM | POA: Diagnosis present

## 2016-12-27 DIAGNOSIS — R6520 Severe sepsis without septic shock: Secondary | ICD-10-CM | POA: Diagnosis present

## 2016-12-27 DIAGNOSIS — Z79899 Other long term (current) drug therapy: Secondary | ICD-10-CM

## 2016-12-27 DIAGNOSIS — E870 Hyperosmolality and hypernatremia: Secondary | ICD-10-CM | POA: Diagnosis present

## 2016-12-27 DIAGNOSIS — R64 Cachexia: Secondary | ICD-10-CM | POA: Diagnosis present

## 2016-12-27 DIAGNOSIS — M6249 Contracture of muscle, multiple sites: Secondary | ICD-10-CM | POA: Diagnosis present

## 2016-12-27 DIAGNOSIS — Z853 Personal history of malignant neoplasm of breast: Secondary | ICD-10-CM

## 2016-12-27 DIAGNOSIS — E049 Nontoxic goiter, unspecified: Secondary | ICD-10-CM | POA: Diagnosis present

## 2016-12-27 DIAGNOSIS — B964 Proteus (mirabilis) (morganii) as the cause of diseases classified elsewhere: Secondary | ICD-10-CM | POA: Diagnosis present

## 2016-12-27 DIAGNOSIS — R571 Hypovolemic shock: Secondary | ICD-10-CM | POA: Diagnosis present

## 2016-12-27 DIAGNOSIS — F3341 Major depressive disorder, recurrent, in partial remission: Secondary | ICD-10-CM | POA: Diagnosis present

## 2016-12-27 DIAGNOSIS — A419 Sepsis, unspecified organism: Secondary | ICD-10-CM | POA: Diagnosis present

## 2016-12-27 DIAGNOSIS — Z515 Encounter for palliative care: Secondary | ICD-10-CM | POA: Diagnosis not present

## 2016-12-27 DIAGNOSIS — K5900 Constipation, unspecified: Secondary | ICD-10-CM | POA: Diagnosis present

## 2016-12-27 DIAGNOSIS — I4891 Unspecified atrial fibrillation: Secondary | ICD-10-CM | POA: Diagnosis present

## 2016-12-27 DIAGNOSIS — Z833 Family history of diabetes mellitus: Secondary | ICD-10-CM

## 2016-12-27 DIAGNOSIS — R1310 Dysphagia, unspecified: Secondary | ICD-10-CM

## 2016-12-27 DIAGNOSIS — E860 Dehydration: Secondary | ICD-10-CM

## 2016-12-27 DIAGNOSIS — L8990 Pressure ulcer of unspecified site, unspecified stage: Secondary | ICD-10-CM

## 2016-12-27 DIAGNOSIS — Z8673 Personal history of transient ischemic attack (TIA), and cerebral infarction without residual deficits: Secondary | ICD-10-CM

## 2016-12-27 DIAGNOSIS — G9341 Metabolic encephalopathy: Secondary | ICD-10-CM | POA: Diagnosis present

## 2016-12-27 DIAGNOSIS — N390 Urinary tract infection, site not specified: Secondary | ICD-10-CM

## 2016-12-27 DIAGNOSIS — E43 Unspecified severe protein-calorie malnutrition: Secondary | ICD-10-CM | POA: Diagnosis present

## 2016-12-27 DIAGNOSIS — M810 Age-related osteoporosis without current pathological fracture: Secondary | ICD-10-CM | POA: Diagnosis present

## 2016-12-27 DIAGNOSIS — Z880 Allergy status to penicillin: Secondary | ICD-10-CM

## 2016-12-27 DIAGNOSIS — Z9011 Acquired absence of right breast and nipple: Secondary | ICD-10-CM

## 2016-12-27 DIAGNOSIS — J690 Pneumonitis due to inhalation of food and vomit: Secondary | ICD-10-CM | POA: Diagnosis not present

## 2016-12-27 DIAGNOSIS — Z794 Long term (current) use of insulin: Secondary | ICD-10-CM

## 2016-12-27 DIAGNOSIS — Z818 Family history of other mental and behavioral disorders: Secondary | ICD-10-CM

## 2016-12-27 DIAGNOSIS — Z7901 Long term (current) use of anticoagulants: Secondary | ICD-10-CM

## 2016-12-27 DIAGNOSIS — R451 Restlessness and agitation: Secondary | ICD-10-CM | POA: Diagnosis not present

## 2016-12-27 HISTORY — DX: Unspecified atrial fibrillation: I48.91

## 2016-12-27 LAB — PROCALCITONIN: PROCALCITONIN: 0.11 ng/mL

## 2016-12-27 LAB — COMPREHENSIVE METABOLIC PANEL
ALT: 130 U/L — AB (ref 14–54)
AST: 184 U/L — AB (ref 15–41)
Albumin: 3.9 g/dL (ref 3.5–5.0)
Alkaline Phosphatase: 155 U/L — ABNORMAL HIGH (ref 38–126)
Anion gap: 10 (ref 5–15)
BILIRUBIN TOTAL: 1.2 mg/dL (ref 0.3–1.2)
BUN: 47 mg/dL — AB (ref 6–20)
CHLORIDE: 119 mmol/L — AB (ref 101–111)
CO2: 29 mmol/L (ref 22–32)
CREATININE: 0.93 mg/dL (ref 0.44–1.00)
Calcium: 9.8 mg/dL (ref 8.9–10.3)
GFR, EST NON AFRICAN AMERICAN: 54 mL/min — AB (ref >60)
Glucose, Bld: 151 mg/dL — ABNORMAL HIGH (ref 65–99)
Potassium: 4 mmol/L (ref 3.5–5.1)
Sodium: 158 mmol/L — ABNORMAL HIGH (ref 135–145)
TOTAL PROTEIN: 7.4 g/dL (ref 6.5–8.1)

## 2016-12-27 LAB — URINALYSIS, ROUTINE W REFLEX MICROSCOPIC
Bilirubin Urine: NEGATIVE
GLUCOSE, UA: NEGATIVE mg/dL
KETONES UR: 5 mg/dL — AB
NITRITE: NEGATIVE
Specific Gravity, Urine: 1.014 (ref 1.005–1.030)
pH: 9 — ABNORMAL HIGH (ref 5.0–8.0)

## 2016-12-27 LAB — CBC WITH DIFFERENTIAL/PLATELET
BASOS ABS: 0 10*3/uL (ref 0.0–0.1)
BASOS PCT: 0 %
EOS ABS: 0 10*3/uL (ref 0.0–0.7)
Eosinophils Relative: 0 %
HCT: 52.8 % — ABNORMAL HIGH (ref 36.0–46.0)
Hemoglobin: 16.7 g/dL — ABNORMAL HIGH (ref 12.0–15.0)
LYMPHS ABS: 22.3 10*3/uL — AB (ref 0.7–4.0)
LYMPHS PCT: 73 %
MCH: 32.6 pg (ref 26.0–34.0)
MCHC: 31.6 g/dL (ref 30.0–36.0)
MCV: 103.1 fL — AB (ref 78.0–100.0)
MONO ABS: 0.6 10*3/uL (ref 0.1–1.0)
Monocytes Relative: 2 %
NEUTROS ABS: 7.6 10*3/uL (ref 1.7–7.7)
Neutrophils Relative %: 25 %
PLATELETS: 329 10*3/uL (ref 150–400)
RBC: 5.12 MIL/uL — ABNORMAL HIGH (ref 3.87–5.11)
RDW: 13.9 % (ref 11.5–15.5)
WBC: 30.5 10*3/uL — ABNORMAL HIGH (ref 4.0–10.5)

## 2016-12-27 LAB — CBG MONITORING, ED
Glucose-Capillary: 163 mg/dL — ABNORMAL HIGH (ref 65–99)
Glucose-Capillary: 149 mg/dL — ABNORMAL HIGH (ref 65–99)

## 2016-12-27 LAB — I-STAT CHEM 8, ED
BUN: 44 mg/dL — AB (ref 6–20)
CALCIUM ION: 1.06 mmol/L — AB (ref 1.15–1.40)
Chloride: 122 mmol/L — ABNORMAL HIGH (ref 101–111)
Creatinine, Ser: 0.7 mg/dL (ref 0.44–1.00)
Glucose, Bld: 231 mg/dL — ABNORMAL HIGH (ref 65–99)
HCT: 35 % — ABNORMAL LOW (ref 36.0–46.0)
HEMOGLOBIN: 11.9 g/dL — AB (ref 12.0–15.0)
Potassium: 3.7 mmol/L (ref 3.5–5.1)
SODIUM: 159 mmol/L — AB (ref 135–145)
TCO2: 28 mmol/L (ref 22–32)

## 2016-12-27 LAB — I-STAT CG4 LACTIC ACID, ED
LACTIC ACID, VENOUS: 3.82 mmol/L — AB (ref 0.5–1.9)
LACTIC ACID, VENOUS: 3.15 mmol/L — AB (ref 0.5–1.9)

## 2016-12-27 LAB — PROTIME-INR
INR: 1.71
INR: 2.02
PROTHROMBIN TIME: 20 s — AB (ref 11.4–15.2)
Prothrombin Time: 22.7 seconds — ABNORMAL HIGH (ref 11.4–15.2)

## 2016-12-27 LAB — LACTIC ACID, PLASMA
Lactic Acid, Venous: 2.2 mmol/L (ref 0.5–1.9)
Lactic Acid, Venous: 1.2 mmol/L (ref 0.5–1.9)

## 2016-12-27 LAB — APTT: aPTT: 29 seconds (ref 24–36)

## 2016-12-27 LAB — STREP PNEUMONIAE URINARY ANTIGEN: STREP PNEUMO URINARY ANTIGEN: NEGATIVE

## 2016-12-27 MED ORDER — SODIUM CHLORIDE 0.9 % IV BOLUS (SEPSIS)
500.0000 mL | Freq: Once | INTRAVENOUS | Status: AC
  Administered 2016-12-27: 500 mL via INTRAVENOUS

## 2016-12-27 MED ORDER — VANCOMYCIN HCL IN DEXTROSE 1-5 GM/200ML-% IV SOLN
1000.0000 mg | INTRAVENOUS | Status: DC
  Filled 2016-12-27: qty 200

## 2016-12-27 MED ORDER — DEXTROSE 5 % IV SOLN
1.0000 g | Freq: Two times a day (BID) | INTRAVENOUS | Status: DC
  Administered 2016-12-28: 1 g via INTRAVENOUS
  Filled 2016-12-27 (×2): qty 1

## 2016-12-27 MED ORDER — MEMANTINE HCL ER 28 MG PO CP24
28.0000 mg | ORAL_CAPSULE | Freq: Every day | ORAL | Status: DC
  Administered 2016-12-29 – 2016-12-31 (×3): 28 mg via ORAL
  Filled 2016-12-27 (×5): qty 1

## 2016-12-27 MED ORDER — INSULIN ASPART 100 UNIT/ML ~~LOC~~ SOLN: 0.0000 [IU] | Freq: Every day | SUBCUTANEOUS | Status: DC

## 2016-12-27 MED ORDER — DONEPEZIL HCL 5 MG PO TABS
5.0000 mg | ORAL_TABLET | Freq: Every day | ORAL | Status: DC
Start: 2016-12-28 — End: 2017-01-01
  Administered 2016-12-30 – 2016-12-31 (×2): 5 mg via ORAL
  Filled 2016-12-27 (×4): qty 1

## 2016-12-27 MED ORDER — ACETAMINOPHEN 650 MG RE SUPP: 650.0000 mg | Freq: Once | RECTAL | Status: DC

## 2016-12-27 MED ORDER — MIRTAZAPINE 15 MG PO TABS
15.0000 mg | ORAL_TABLET | Freq: Every day | ORAL | Status: DC
  Administered 2016-12-30 – 2016-12-31 (×2): 15 mg via ORAL
  Filled 2016-12-27 (×4): qty 1

## 2016-12-27 MED ORDER — SENNOSIDES-DOCUSATE SODIUM 8.6-50 MG PO TABS: 1.0000 | ORAL_TABLET | Freq: Two times a day (BID) | ORAL | Status: DC | PRN

## 2016-12-27 MED ORDER — ONDANSETRON HCL 4 MG PO TABS: 4.0000 mg | ORAL_TABLET | Freq: Four times a day (QID) | ORAL | Status: DC | PRN

## 2016-12-27 MED ORDER — ACETAMINOPHEN 325 MG PO TABS: 650.0000 mg | ORAL_TABLET | Freq: Four times a day (QID) | ORAL | Status: DC | PRN

## 2016-12-27 MED ORDER — VANCOMYCIN HCL IN DEXTROSE 1-5 GM/200ML-% IV SOLN
1000.0000 mg | Freq: Once | INTRAVENOUS | Status: AC
  Administered 2016-12-27: 1000 mg via INTRAVENOUS
  Filled 2016-12-27: qty 200

## 2016-12-27 MED ORDER — APIXABAN 2.5 MG PO TABS
2.5000 mg | ORAL_TABLET | Freq: Two times a day (BID) | ORAL | Status: DC
  Administered 2016-12-29 – 2016-12-31 (×5): 2.5 mg via ORAL
  Filled 2016-12-27 (×6): qty 1

## 2016-12-27 MED ORDER — INSULIN ASPART 100 UNIT/ML ~~LOC~~ SOLN
0.0000 [IU] | Freq: Three times a day (TID) | SUBCUTANEOUS | Status: DC
  Administered 2016-12-28: 2 [IU] via SUBCUTANEOUS

## 2016-12-27 MED ORDER — ENSURE ENLIVE PO LIQD: 237.0000 mL | Freq: Three times a day (TID) | ORAL | Status: DC

## 2016-12-27 MED ORDER — ALPRAZOLAM 0.25 MG PO TABS: 0.2500 mg | ORAL_TABLET | Freq: Two times a day (BID) | ORAL | Status: DC | PRN

## 2016-12-27 MED ORDER — METOPROLOL TARTRATE 5 MG/5ML IV SOLN
2.5000 mg | Freq: Once | INTRAVENOUS | Status: DC
  Filled 2016-12-27: qty 5

## 2016-12-27 MED ORDER — PAROXETINE HCL 20 MG PO TABS
40.0000 mg | ORAL_TABLET | Freq: Every day | ORAL | Status: DC
  Administered 2016-12-29 – 2016-12-31 (×3): 40 mg via ORAL
  Filled 2016-12-27 (×5): qty 2

## 2016-12-27 MED ORDER — POLYETHYLENE GLYCOL 3350 17 G PO PACK: 17.0000 g | PACK | Freq: Every day | ORAL | Status: DC | PRN

## 2016-12-27 MED ORDER — ACETAMINOPHEN 650 MG RE SUPP: 650.0000 mg | Freq: Four times a day (QID) | RECTAL | Status: DC | PRN

## 2016-12-27 MED ORDER — CEFEPIME HCL 1 G IJ SOLR
1.0000 g | Freq: Once | INTRAMUSCULAR | Status: AC
  Administered 2016-12-27: 1 g via INTRAVENOUS
  Filled 2016-12-27: qty 1

## 2016-12-27 MED ORDER — SODIUM CHLORIDE 0.9 % IV BOLUS (SEPSIS)
1000.0000 mL | Freq: Once | INTRAVENOUS | Status: AC
  Administered 2016-12-27: 1000 mL via INTRAVENOUS

## 2016-12-27 MED ORDER — SODIUM CHLORIDE 0.45 % IV SOLN
INTRAVENOUS | Status: DC
  Administered 2016-12-27: 22:00:00 via INTRAVENOUS

## 2016-12-27 MED ORDER — ONDANSETRON HCL 4 MG/2ML IJ SOLN: 4.0000 mg | Freq: Four times a day (QID) | INTRAMUSCULAR | Status: DC | PRN

## 2016-12-27 NOTE — ED Triage Notes (Signed)
Pt presents with altered LOC first noted by husband 4 days.  Husband reports pt not attempting to speak, won't track with eyes and not eating.  He reports "jerking" movements of eyes and arms.

## 2016-12-27 NOTE — ED Triage Notes (Signed)
Admitting MD notified that Pt unable to take PO supplement

## 2016-12-27 NOTE — Progress Notes (Signed)
Pharmacy Antibiotic Note  Brittney Powell is a 83 y.o. female admitted on 12/27/2016 with sepsis.  Pharmacy has been consulted for cefepime and vancomycin dosing. nCrCl ~ 50 mL/min. WBC 30.5. Tm 103.2F   Plan: -Cefepime 1 gm IV Q 12 hours -Vancomycin 1 gm IV once, then vancomycin 1 gm IV Q 24 hours  -Monitor CBC, renal fx, cultures and clinical progress -VT at SS      Temp (24hrs), Avg:103.6 F (39.8 C), Min:103.6 F (39.8 C), Max:103.6 F (39.8 C)  Recent Labs  Lab 12/27/16 1236  LATICACIDVEN 3.82*    CrCl cannot be calculated (Patient's most recent lab result is older than the maximum 21 days allowed.).    Allergies  Allergen Reactions  . Penicillins Other (See Comments)    Unsure of reaction    Antimicrobials this admission: Vanc 11/7 >>  Cefepime 11/7 >>   Dose adjustments this admission: None   Microbiology results: 11/7 BCx:  11/7 UCx:    Thank you for allowing pharmacy to be a part of this patient's care.  Albertina Parr, PharmD., BCPS Clinical Pharmacist Pager [phone]

## 2016-12-27 NOTE — ED Provider Notes (Signed)
[place] EMERGENCY DEPARTMENT Provider Note   CSN: [id number] Arrival date & time: 12/27/16  1158     History   Chief Complaint Chief Complaint  Patient presents with  . Altered Mental Status    HPI Brittney Powell is a 83 y.o. female.  HPI   83 year old female with a history of atrial fibrillation on Eliquis, depression, diabetes, hypertension, lipidemia, dementia residing in [facility] requiring full care, presents with concern for fatigue, decreased appetite.  Husband reports that over the last couple of days, patient has been sleepier, less interactive, and she has not wanted to eat or drink much at all.  Denies recent falls or trauma.  He does not know of fevers at the facility.  Reports that she has a history of aspiration, had speech swallow test done and was put on a special diet, however he has noted that she continues to have aspiration events at times, and that she does have a cough.  Denies the cough significantly worsening recently.  Denies vomiting.  Reports that she is just appeared less interactive and more fatigued.  At baseline he reports she will answer yes, no questions but is not a reliable historian.  Reports she is DNR/DNI and that she would not want a feeding tube. They have a living will.   Past Medical History:  Diagnosis Date  . Abnormality of gait 05/15/2013  . Atrial fibrillation ([place])   . Breast cancer ([place]) 1980   right  . Dementia   . Depression   . DM (diabetes mellitus) ([place])   . HTN (hypertension)   . Hypercholesterolemia   . Intrauterine pessary   . Memory difficulty 05/15/2013  . Osteoporosis   . TIA (transient ischemic attack)   . Wrist fracture 05/2011    Patient Active Problem List   Diagnosis Date Noted  . Sepsis ([place]) 12/27/2016  . Acute lower UTI 12/27/2016  . Aspiration pneumonia ([place]) 12/27/2016  . Acute on chronic alteration in mental status 12/27/2016  . Electrolyte abnormality 12/27/2016  . Dysphagia  12/27/2016  . Severe protein-calorie malnutrition ([place]) 06/08/2016  . Recurrent major depressive disorder, in partial remission ([place]) 06/08/2016  . Dementia without behavioral disturbance 07/22/2015  . Protein-calorie malnutrition ([place]) 07/22/2015  . DM2 (diabetes mellitus, type 2) ([place]) 07/22/2015  . Hyperlipidemia 07/22/2015  . Controlled type 2 diabetes mellitus without complication, without long-term current use of insulin ([place]) 03/03/2015  . Atrial fibrillation ([place]) 01/17/2015  . Anxiety state 09/09/2014  . Goiter diffuse   . Displaced intertrochanteric fracture of left femur ([place]) 08/25/2014  . Dementia 08/25/2014  . Diabetes mellitus type II, uncontrolled ([place]) 08/25/2014  . Fall 08/25/2014  . Atrial fibrillation with RVR (new onset) 08/25/2014  . HTN (hypertension) 08/25/2014  . Elevated d-dimer 08/25/2014  . Left displaced femoral neck fracture ([employer]) 08/25/2014  . Goiter 08/25/2014  . Memory difficulty 05/15/2013  . Abnormality of gait 05/15/2013  . Loss of weight 05/15/2013    Past Surgical History:  Procedure Laterality Date  . MASTECTOMY MODIFIED RADICAL Right    30 years ago    OB History    No data available       Home Medications    Prior to Admission medications   Medication Sig Start Date End Date Taking? Authorizing Provider  acetaminophen (TYLENOL) 325 MG tablet Take 650 mg by mouth every 6 (six) hours as needed for moderate pain.   Yes [provider]  ALPRAZolam (XANAX) 0.25 MG tablet Take 1  tablet (0.25 mg total) by mouth at bedtime. 01/26/16  Yes Ngetich, Dinah C, NP  apixaban (ELIQUIS) 2.5 MG TABS tablet Take 1 tablet (2.5 mg total) by mouth 2 (two) times daily. 08/29/14  Yes Kelvin Cellar, MD  diltiazem (CARDIZEM CD) 240 MG 24 hr capsule Take 1 capsule (240 mg total) by mouth daily. 06/15/16  Yes Lelon Perla, MD  donepezil (ARICEPT) 5 MG tablet Take 1 tablet (5 mg total) by mouth at bedtime. 09/27/15  Yes Kathrynn Ducking, MD    ezetimibe (ZETIA) 10 MG tablet Take 10 mg by mouth daily. For hyperlipidemia   Yes [provider]  insulin glargine (LANTUS) 100 UNIT/ML injection Inject 8 Units into the skin at bedtime.   Yes [provider]  insulin lispro (HUMALOG) 100 UNIT/ML injection Sliding scale dosing per CBG results BID 201-250=3 units, 251-350= 5 units, >351=call MD   Yes [provider]  loratadine (CLARITIN) 10 MG tablet Take 10 mg by mouth daily.   Yes [provider]  memantine (NAMENDA XR) 28 MG CP24 24 hr capsule Take 1 capsule (28 mg total) by mouth daily. 05/12/14  Yes Kathrynn Ducking, MD  mirtazapine (REMERON) 15 MG tablet Take 15 mg at bedtime by mouth.    Yes [provider]  Multiple Vitamin (MULTIVITAMIN) tablet Take 1 tablet by mouth daily.   Yes [provider]  ondansetron (ZOFRAN) 4 MG tablet Take 1 tablet (4 mg total) by mouth every 8 (eight) hours as needed for nausea or vomiting. 08/27/14  Yes Claiborne Billings, Brittney, PA-C  PARoxetine (PAXIL) 40 MG tablet Take 40 mg by mouth daily. For depression   Yes [provider]  polyethylene glycol (MIRALAX / GLYCOLAX) packet Take 17 g by mouth daily. Hold for loose stools   Yes [provider]  senna-docusate (SENNA PLUS) 8.6-50 MG tablet Take 1 tablet by mouth 2 (two) times daily.   Yes [provider]  Docusate Sodium (COLACE PO) Take 10 mLs by mouth 2 (two) times daily.    [provider]  NUTRITIONAL SUPPLEMENT LIQD Take 120 mLs by mouth 3 (three) times daily. MedPass due to weight loss    [provider]  Nutritional Supplements (NUTRITIONAL SUPPLEMENT PO) Take 1 each by mouth 2 (two) times daily. 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 Diabetes Father   . Cancer Father        leukemia  . Heart attack Brother   . Dementia Maternal Aunt      Social History Social History   Tobacco Use  . Smoking status: Never Smoker  . Smokeless tobacco: Never Used  Substance Use Topics  . Alcohol use: No    Alcohol/week: 0.0 oz  . Drug use: No     Allergies   Penicillins   Review of Systems Review of Systems  Unable to perform ROS: Dementia  Constitutional: Positive for appetite change and fatigue.  Respiratory: Positive for cough.   Gastrointestinal: Negative for diarrhea and vomiting.     Physical Exam Updated Vital Signs BP (!) 94/59   Pulse 100   Temp 98.6 F (37 C) (Rectal)   Resp 18   Wt 41.3 kg (91 lb)   SpO2 99%   BMI 14.69 kg/m   Physical Exam  Constitutional: She appears listless. She appears cachectic. She appears toxic. She has a  sickly appearance. She appears ill. No distress.  HENT:  Head: Normocephalic and atraumatic.  Eyes: Conjunctivae and EOM are normal.  Neck: Normal range of motion.  Cardiovascular: Normal heart sounds and intact distal pulses. An irregularly irregular rhythm present. Tachycardia present. Exam reveals no gallop and no friction rub.  No murmur heard. Pulmonary/Chest: Effort normal and breath sounds normal. No respiratory distress. She has no wheezes. She has no rales.  Abdominal: Soft. She exhibits no distension. There is no tenderness. There is no guarding.  Musculoskeletal: She exhibits no edema or tenderness.  Neurological: She appears listless.  Skin: Skin is warm and dry. No rash noted. She is not diaphoretic. No erythema.  Nursing note and vitals reviewed.    ED Treatments / Results  Labs (all labs ordered are listed, but only abnormal results are displayed) Labs Reviewed  COMPREHENSIVE METABOLIC PANEL - Abnormal; Notable for the following components:      Result Value   Sodium 158 (*)    Chloride 119 (*)    Glucose, Bld 151 (*)    BUN 47 (*)    AST 184 (*)    ALT 130 (*)    Alkaline Phosphatase 155 (*)    GFR calc non Af Amer 54 (*)    All other  components within normal limits  CBC WITH DIFFERENTIAL/PLATELET - Abnormal; Notable for the following components:   WBC 30.5 (*)    RBC 5.12 (*)    Hemoglobin 16.7 (*)    HCT 52.8 (*)    MCV 103.1 (*)    Lymphs Abs 22.3 (*)    All other components within normal limits  PROTIME-INR - Abnormal; Notable for the following components:   Prothrombin Time 20.0 (*)    All other components within normal limits  URINALYSIS, ROUTINE W REFLEX MICROSCOPIC - Abnormal; Notable for the following components:   APPearance TURBID (*)    pH 9.0 (*)    Hgb urine dipstick SMALL (*)    Ketones, ur 5 (*)    Protein, ur >=300 (*)    Leukocytes, UA SMALL (*)    Bacteria, UA FEW (*)    Squamous Epithelial / LPF 6-30 (*)    All other components within normal limits  I-STAT CG4 LACTIC ACID, ED - Abnormal; Notable for the following components:   Lactic Acid, Venous 3.82 (*)    All other components within normal limits  I-STAT CG4 LACTIC ACID, ED - Abnormal; Notable for the following components:   Lactic Acid, Venous 3.15 (*)    All other components within normal limits  I-STAT CHEM 8, ED - Abnormal; Notable for the following components:   Sodium 159 (*)    Chloride 122 (*)    BUN 44 (*)    Glucose, Bld 231 (*)    Calcium, Ion 1.06 (*)    Hemoglobin 11.9 (*)    HCT 35.0 (*)    All other components within normal limits  CULTURE, BLOOD (ROUTINE X 2)  CULTURE, BLOOD (ROUTINE X 2)  URINE CULTURE  CULTURE, EXPECTORATED SPUTUM-ASSESSMENT  GRAM STAIN  RESPIRATORY PANEL BY PCR  PATHOLOGIST SMEAR REVIEW  LACTIC ACID, PLASMA  LACTIC ACID, PLASMA  PROCALCITONIN  PROTIME-INR  APTT  STREP PNEUMONIAE URINARY ANTIGEN  CBC  LEGIONELLA PNEUMOPHILA SEROGP 1 UR AG  BASIC METABOLIC PANEL  BASIC METABOLIC PANEL  HIV ANTIBODY (ROUTINE TESTING)  I-STAT CG4 LACTIC ACID, ED  I-STAT CG4 LACTIC ACID, ED    EKG  EKG Interpretation  Date/Time:  Wednesday December 27 2016 12:11:01 EST Ventricular Rate:  170 PR  Interval:    QRS Duration: 70 QT Interval:  262 QTC Calculation: 440 R Axis:   -147 Text Interpretation:  Atrial fibrillation with rapid ventricular response with premature ventricular or aberrantly conducted complexes Right superior axis deviation Anteroseptal infarct , age undetermined Marked ST abnormality, possible inferior subendocardial injury Abnormal ECG Since prior ECG, rate has increased, no other significant changes Confirmed by Gareth Morgan ([id number]) on 12/27/2016 12:37:59 PM       Radiology Dg Chest Port 1 View  Result Date: 12/27/2016 CLINICAL DATA:  Altered mental status EXAM: PORTABLE CHEST 1 VIEW COMPARISON:  11/29/2016 FINDINGS: Cardiac shadow is stable. Aortic calcifications are again noted. 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 Elevation of left hemidiaphragm is again seen. No focal infiltrate or sizable effusion is noted. Tiny calcified granuloma is noted laterally on the right. No bony abnormality is seen. IMPRESSION: Chronic changes without acute abnormality. Electronically Signed   By: Inez Catalina M.D.   On: 12/27/2016 14:14    Procedures Procedures (including critical care time)  Medications Ordered in ED Medications  vancomycin (VANCOCIN) IVPB 1000 mg/200 mL premix (not administered)  ceFEPIme (MAXIPIME) 1 g in dextrose 5 % 50 mL IVPB (not administered)  acetaminophen (TYLENOL) suppository 650 mg (0 mg Rectal Hold 12/27/16 1618)  ALPRAZolam (XANAX) tablet 0.25 mg (not administered)  apixaban (ELIQUIS) tablet 2.5 mg (not administered)  donepezil (ARICEPT) tablet 5 mg (not administered)  memantine (NAMENDA XR) 24 hr capsule 28 mg (not administered)  mirtazapine (REMERON) tablet 15 mg (not administered)  NUTRITIONAL SUPPLEMENT LIQD 120 mL (not administered)  PARoxetine (PAXIL) tablet 40 mg (not administered)  senna-docusate (Senokot-S) tablet 1 tablet (not administered)  polyethylene glycol (MIRALAX / GLYCOLAX) packet 17 g (not  administered)  acetaminophen (TYLENOL) tablet 650 mg (not administered)    Or  acetaminophen (TYLENOL) suppository 650 mg (not administered)  ondansetron (ZOFRAN) tablet 4 mg (not administered)    Or  ondansetron (ZOFRAN) injection 4 mg (not administered)  insulin aspart (novoLOG) injection 0-9 Units (not administered)  insulin aspart (novoLOG) injection 0-5 Units (not administered)  0.45 % sodium chloride infusion (not administered)  sodium chloride 0.9 % bolus 1,000 mL (0 mLs Intravenous Stopped 12/27/16 1317)    And  sodium chloride 0.9 % bolus 500 mL (0 mLs Intravenous Stopped 12/27/16 1346)  ceFEPIme (MAXIPIME) 1 g in dextrose 5 % 50 mL IVPB (0 g Intravenous Stopped 12/27/16 1432)  vancomycin (VANCOCIN) IVPB 1000 mg/200 mL premix (0 mg Intravenous Stopped 12/27/16 1431)  sodium chloride 0.9 % bolus 1,000 mL (0 mLs Intravenous Stopped 12/27/16 1427)  CRITICAL CARE: afib RVR rate 170s, sepsis Performed by: Tennis Must   Total critical care time: 35 minutes  Critical care time was exclusive of separately billable procedures and treating other patients.  Critical care was necessary to treat or prevent imminent or life-threatening deterioration.  Critical care was time spent personally by me on the following activities: development of treatment plan with patient and/or surrogate as well as nursing, discussions with consultants, evaluation of patient's response to treatment, examination of patient, obtaining history from patient or surrogate, ordering and performing treatments and interventions, ordering and review of laboratory studies, ordering and review of radiographic studies, pulse oximetry and re-evaluation of patient's condition.    Initial Impression / Assessment and Plan / ED Course  I have reviewed the triage vital signs and the nursing notes.  Pertinent labs &  imaging results that were available during my care of the patient were reviewed by me and considered in my medical  decision making (see chart for details).      83 year old female with a history of atrial fibrillation on Eliquis, depression, diabetes, hypertension, lipidemia, dementia residing in [facility] requiring full care, presents with concern for fatigue, decreased appetite.  On arrival to the emergency department, patient with atrial fibrillation with RVR and a rate in the 170s, with normal blood pressures.  Considered rate control agent, however patient found to be febrile to 103.6, and suspect sepsis as well as dehydration as underling etiology of her atrial fibrillation with RVR.  Labs significant for lactic acid elevated, WBC 30000. Hypernatremia likely secondary to poor po intake, dehydration.    Patient given 30cc/kg normal saline, vancomycin and cefepime for concern for sepsis of unclear etiology. (penicillin allergy)  Urinalysis returned concerning for UTI.  CXR clear however consider pneumonia without signs on XR yet given some cough and aspiration hx at home.  Blood cx pending.    Heart rate improved to 110s with fluid, blood pressures dropped initially to 90s however additional fluid ordered with improvement to 120s. Will admit for further care. Patient is DNR/DNI.      Final Clinical Impressions(s) / ED Diagnoses   Final diagnoses:  Sepsis, due to unspecified organism [facility])  Dehydration  Hypernatremia  Atrial fibrillation with RVR [facility])    ED Discharge Orders    None       Gareth Morgan, MD 12/27/16 1850

## 2016-12-27 NOTE — ED Notes (Signed)
Reason for delay re bed status explained to pt's family, they verbalized understanding.

## 2016-12-27 NOTE — H&P (Signed)
History and Physical    Brittney Powell GYF:[id number] DOB: [birth year] DOA: 12/27/2016  PCP: [name], MD Patient coming from: SNF  Chief Complaint: unresponsiveness   HPI: Brittney Powell is a 83 y.o. female with medical history significant of which fibrillation, breast cancer, dementia, depression, diabetes, hypertension, hyperlipidemia, suppresses, TIA, recent hip fracture. Patient has had a gradual mental decline ever since breaking her hip and being placed in nursing home/rehabilitation facility. Patient is cared to regularly by her husband who states he is having increasing difficulty with feeding her. Some1 to simple refused to eat all of the time she coughs and chokes on her meals, which Is very regularly. Patient has had a partially 9 pound weight loss over the last couple of weeks. At baseline patient is confused but able to participate in some conversation but is becoming progressively nonverbal. At the nursing of patient is on nectar thick liquids. On day of admission patient was noted to be essentially unresponsive   ED Course: Sepsis protocol initiated with vancomycin and cefepime ordered as well as 2.5 L bolus.  Review of Systems: As per HPI otherwise all other systems reviewed and are negative  Ambulatory Status: requires assistance w/ minimal ambulation  Past Medical History:  Diagnosis Date  . Abnormality of gait 05/15/2013  . Atrial fibrillation ([place])   . Breast cancer ([place]) 1980   right  . Dementia   . Depression   . DM (diabetes mellitus) ([place])   . HTN (hypertension)   . Hypercholesterolemia   . Intrauterine pessary   . Memory difficulty 05/15/2013  . Osteoporosis   . TIA (transient ischemic attack)   . Wrist fracture 05/2011    Past Surgical History:  Procedure Laterality Date  . MASTECTOMY MODIFIED RADICAL Right    30 years ago    Social History   Socioeconomic History  . Marital status: Married    Spouse name: [name]  . Number of children: 2  .  Years of education: 9TH  . Highest education level: Not on file  Social Needs  . Financial resource strain: Not on file  . Food insecurity - worry: Not on file  . Food insecurity - inability: Not on file  . Transportation needs - medical: Not on file  . Transportation needs - non-medical: Not on file  Occupational History  . Occupation: Retired  Tobacco Use  . Smoking status: Never Smoker  . Smokeless tobacco: Never Used  Substance and Sexual Activity  . Alcohol use: No    Alcohol/week: 0.0 oz  . Drug use: No  . Sexual activity: Not on file  Other Topics Concern  . Not on file  Social History Narrative   L[facility] at [facility] since 08/30[facility]14   Patient is married to [place]).   Retired.    Education high school.    Right handed.   Never smoked   Alcohol none   Full Code    Allergies  Allergen Reactions  . Penicillins Other (See Comments)    Unsure of reaction    Family History  Problem Relation Age of Onset  . Diabetes Father   . Cancer Father        leukemia  . Heart attack Brother   . Dementia Maternal Aunt       Prior to Admission medications   Medication Sig Start Date End Date Taking? Authorizing Provider  acetaminophen (TYLENOL) 325 MG tablet Take 650 mg by mouth every 6 (six) hours as needed for moderate pain.  [provider]  ALPRAZolam Duanne Moron) 0.25 MG tablet Take 1 tablet (0.25 mg total) by mouth at bedtime. 01/26/16   Ngetich, Dinah C, NP  apixaban (ELIQUIS) 2.5 MG TABS tablet Take 1 tablet (2.5 mg total) by mouth 2 (two) times daily. 08/29/14   Kelvin Cellar, MD  diltiazem (CARDIZEM CD) 240 MG 24 hr capsule Take 1 capsule (240 mg total) by mouth daily. 06/15/16   Lelon Perla, MD  Docusate Sodium (COLACE PO) Take 10 mLs by mouth 2 (two) times daily.    [provider]  donepezil (ARICEPT) 5 MG tablet Take 1 tablet (5 mg total) by mouth at bedtime. 09/27/15   Kathrynn Ducking, MD  ezetimibe (ZETIA) 10 MG tablet Take 10 mg by mouth  daily. For hyperlipidemia    [provider]  insulin glargine (LANTUS) 100 UNIT/ML injection Inject 8 Units into the skin at bedtime.    [provider]  insulin lispro (HUMALOG) 100 UNIT/ML injection Sliding scale dosing per CBG results BID 201-250=3 units, 251-350= 5 units, >351=call MD    [provider]  loratadine (CLARITIN) 10 MG tablet Take 10 mg by mouth daily.    [provider]  memantine (NAMENDA XR) 28 MG CP24 24 hr capsule Take 1 capsule (28 mg total) by mouth daily. 05/12/14   Kathrynn Ducking, MD  mirtazapine (REMERON) 15 MG tablet Take 7.5 mg by mouth daily.     [provider]  Multiple Vitamin (MULTIVITAMIN) tablet Take 1 tablet by mouth daily.    [provider]  NUTRITIONAL SUPPLEMENT LIQD Take 120 mLs by mouth 3 (three) times daily. MedPass due to weight loss    [provider]  Nutritional Supplements (NUTRITIONAL SUPPLEMENT PO) Take 1 each by mouth 2 (two) times daily. With lunch and dinner due to being underweight Marketing executive)    [provider]  ondansetron (ZOFRAN) 4 MG tablet Take 1 tablet (4 mg total) by mouth every 8 (eight) hours as needed for nausea or vomiting. 08/27/14   Lovett Calender, PA-C  PARoxetine (PAXIL) 40 MG tablet Take 40 mg by mouth daily. For depression    [provider]  polyethylene glycol (MIRALAX / GLYCOLAX) packet Take 17 g by mouth daily. Hold for loose stools    [provider]  senna-docusate (SENNA PLUS) 8.6-50 MG tablet Take 1 tablet by mouth 2 (two) times daily.    [provider]    Physical Exam: Vitals:   12/27/16 1515 12/27/16 1527 12/27/16 1545 12/27/16 1615  BP: 101/66  (!) 94/51 (!) 94/59  Pulse: (!) 42  100   Resp: 17  17 18   Temp:  98.6 F (37 C)    TempSrc:  Rectal    SpO2: (!) 77%  99%   Weight:         General: Frail and cachectic appearing, resting in bed. Eyes:  PERRL, EOMI, normal lids, iris ENT: Hard of hearing, dry  mucous membranes Neck:  no LAD, masses or thyromegaly Cardiovascular:  RRR, no m/r/g. No LE edema.  Respiratory:  CTA bilaterally, no w/r/r. Normal respiratory effort. Abdomen:  soft, ntnd, NABS Skin:  no rash or induration seen on limited exam Musculoskeletal: Contracture of hands and feet appreciated. This is likely secondary to progressive stiffness due to lack of use over time. No bony abnormalities appreciated. Bilateral feet in protective cushions. Psychiatric: Attempts to respond when engaged in conversation. Intermittently awake. Neurologic: Difficult to assess due to patient's somnolence state. Sensation  intact. No facial asymmetry or limb with flaccid nature  Labs on Admission: I have personally reviewed following labs and imaging studies  CBC: Recent Labs  Lab 12/27/16 1224 12/27/16 1446  WBC 30.5*  --   NEUTROABS 7.6  --   HGB 16.7* 11.9*  HCT 52.8* 35.0*  MCV 103.1*  --   PLT 329  --    Basic Metabolic Panel: Recent Labs  Lab 12/27/16 1224 12/27/16 1446  NA 158* 159*  K 4.0 3.7  CL 119* 122*  CO2 29  --   GLUCOSE 151* 231*  BUN 47* 44*  CREATININE 0.93 0.70  CALCIUM 9.8  --    GFR: Estimated Creatinine Clearance: 32.3 mL/min (by C-G formula based on SCr of 0.7 mg/dL). Liver Function Tests: Recent Labs  Lab 12/27/16 1224  AST 184*  ALT 130*  ALKPHOS 155*  BILITOT 1.2  PROT 7.4  ALBUMIN 3.9   No results for input(s): LIPASE, AMYLASE in the last 168 hours. No results for input(s): AMMONIA in the last 168 hours. Coagulation Profile: Recent Labs  Lab 12/27/16 1224  INR 1.71   Cardiac Enzymes: No results for input(s): CKTOTAL, CKMB, CKMBINDEX, TROPONINI in the last 168 hours. BNP (last 3 results) No results for input(s): PROBNP in the last 8760 hours. HbA1C: No results for input(s): HGBA1C in the last 72 hours. CBG: No results for input(s): GLUCAP in the last 168 hours. Lipid Profile: No results for input(s): CHOL, HDL, LDLCALC, TRIG,  CHOLHDL, LDLDIRECT in the last 72 hours. Thyroid Function Tests: No results for input(s): TSH, T4TOTAL, FREET4, T3FREE, THYROIDAB in the last 72 hours. Anemia Panel: No results for input(s): VITAMINB12, FOLATE, FERRITIN, TIBC, IRON, RETICCTPCT in the last 72 hours. Urine analysis:    Component Value Date/Time   COLORURINE YELLOW 12/27/2016 1322   APPEARANCEUR TURBID (A) 12/27/2016 1322   LABSPEC 1.014 12/27/2016 1322   PHURINE 9.0 (H) 12/27/2016 1322   GLUCOSEU NEGATIVE 12/27/2016 1322   HGBUR SMALL (A) 12/27/2016 1322   BILIRUBINUR NEGATIVE 12/27/2016 1322   KETONESUR 5 (A) 12/27/2016 1322   PROTEINUR >=300 (A) 12/27/2016 1322   UROBILINOGEN 0.2 08/25/2014 1240   NITRITE NEGATIVE 12/27/2016 1322   LEUKOCYTESUR SMALL (A) 12/27/2016 1322    Creatinine Clearance: Estimated Creatinine Clearance: 32.3 mL/min (by C-G formula based on SCr of 0.7 mg/dL).  Sepsis Labs: @LABRCNTIP (procalcitonin:4,lacticidven:4) )No results found for this or any previous visit (from the past 240 hour(s)).   Radiological Exams on Admission: Dg Chest Port 1 View  Result Date: 12/27/2016 CLINICAL DATA:  Altered mental status EXAM: PORTABLE CHEST 1 VIEW COMPARISON:  11/29/2016 FINDINGS: Cardiac shadow is stable. Aortic calcifications are again noted. 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 Elevation of left hemidiaphragm is again seen. No focal infiltrate or sizable effusion is noted. Tiny calcified granuloma is noted laterally on the right. No bony abnormality is seen. IMPRESSION: Chronic changes without acute abnormality. Electronically Signed   By: Inez Catalina M.D.   On: 12/27/2016 14:14    EKG: Independently reviewed. Afib. RVR  Assessment/Plan Active Problems:   HTN (hypertension)   Atrial fibrillation (HCC)   Dementia without behavioral disturbance   DM2 (diabetes mellitus, type 2) (HCC)   Severe protein-calorie malnutrition (HCC)   Sepsis ([place])   Acute lower UTI    Aspiration pneumonia (HCC)   Acute on chronic alteration in mental status   Electrolyte abnormality   Dysphagia    Sepsis: May be lethal condition for pt which family  is aware may occur. Likely UTI given UA though sample contaminated. Additionally may be related to aspiration pneumonia as pt having increased difficulty w/ swallowing and coughing episodes while eating per fmaily. 2.5L given in ED w/ virtually no output - pt is extremely dehydrated as discussed below. Lactic acid improved from 3.5-3.12 after 2.5 L of fluid and antibiotics. - Sepsis protocol - IVF - Continue Vanc/Cef (consider narrowing coverage to Unasyn in am) - Lactic acid trend - f/u cultures - Confirmed DNR w/ husband and daughter  Acute on chronic encephalopathy: likely secondary to advanced/end stage dementia and sepsis. Family describes a very poor mental and physical course over the last couple of months to the point where patient is minimally verbal and has yearly complete oral aversion. I am concerned that patient is very rapidly approaching end-of-life secondary to her dementia. 8 pound weight loss over the recent past. - Palliative care for goals of care and likely hospice. Family is aware that patient has a likely approaching end-of-life. - continue Aricept and Namenda though not likely providing any benefit and can consider DC at time of discharge.   Electrolyte abnormalities: NA 159, chloride 122, glucose 231.  NS bolus as above  - 1/2NS  - BMP Q8  Afib: tele - continue eliquis - Hold Dilt due to soft BP  Dysphasia: Ongoing for several months now. Patient has had formal GI and swallow are constant in the past. - Nothing by mouth until of her current sepsis episode then nectar thick liquids for diet.  HTN: hypotensive secondary to sepsis and severe dehydration - hold home Dilt  Afib: what was thought to be RVR on admission was likely tachycardia from hypovolemic shock from sepsis and dehydration -  continue Eliquis  DM: - hold lantus - SSI  Depression: - continue Remeron and Paxil  Constipation: - Miralax ande Senna prn  Anxiety:  - Xanax prn   DVT prophylaxis: Eliquis Code Status: DNR  Family Communication: Husband and daughter  Disposition Plan: pending possible improvement vs making comfort care. Eval by Palliative care  Consults called: Palliative care  Admission status: Inpt SDU    Waldemar Dickens MD [facility]  If 7PM-7AM, please contact night-coverage [email or web address] Password [facility]  12/27/2016, 5:34 PM     Critical Care Time This patient is critically ill requiring frequent monitoring and support due to their life/organ threatening condition. This care involves a high complexity of medical decision making. Greater than 70 minutes spent in direct patient care and coordination.

## 2016-12-27 NOTE — ED Triage Notes (Signed)
Call PT's Husband at [phone] (cell) if no answer at home number

## 2016-12-28 ENCOUNTER — Other Ambulatory Visit: Payer: Self-pay

## 2016-12-28 ENCOUNTER — Encounter (HOSPITAL_COMMUNITY): Payer: Self-pay

## 2016-12-28 DIAGNOSIS — Z7189 Other specified counseling: Secondary | ICD-10-CM

## 2016-12-28 DIAGNOSIS — Z515 Encounter for palliative care: Secondary | ICD-10-CM

## 2016-12-28 DIAGNOSIS — L8990 Pressure ulcer of unspecified site, unspecified stage: Secondary | ICD-10-CM

## 2016-12-28 DIAGNOSIS — E860 Dehydration: Secondary | ICD-10-CM

## 2016-12-28 LAB — BASIC METABOLIC PANEL
Anion gap: 5 (ref 5–15)
Anion gap: 6 (ref 5–15)
BUN: 28 mg/dL — ABNORMAL HIGH (ref 6–20)
BUN: 29 mg/dL — AB (ref 6–20)
CHLORIDE: 120 mmol/L — AB (ref 101–111)
CO2: 27 mmol/L (ref 22–32)
CO2: 24 mmol/L (ref 22–32)
Calcium: 8.2 mg/dL — ABNORMAL LOW (ref 8.9–10.3)
Calcium: 8.2 mg/dL — ABNORMAL LOW (ref 8.9–10.3)
Chloride: 121 mmol/L — ABNORMAL HIGH (ref 101–111)
Creatinine, Ser: 0.64 mg/dL (ref 0.44–1.00)
Creatinine, Ser: 0.54 mg/dL (ref 0.44–1.00)
GFR calc Af Amer: >60 mL/min (ref >60)
GFR calc Af Amer: >60 mL/min (ref >60)
GFR calc non Af Amer: >60 mL/min (ref >60)
GFR calc non Af Amer: >60 mL/min (ref >60)
GLUCOSE: 159 mg/dL — AB (ref 65–99)
Glucose, Bld: 176 mg/dL — ABNORMAL HIGH (ref 65–99)
POTASSIUM: 3.5 mmol/L (ref 3.5–5.1)
Potassium: 3.8 mmol/L (ref 3.5–5.1)
SODIUM: 150 mmol/L — AB (ref 135–145)
Sodium: 153 mmol/L — ABNORMAL HIGH (ref 135–145)

## 2016-12-28 LAB — CBC
HCT: 38.4 % (ref 36.0–46.0)
HEMOGLOBIN: 11.4 g/dL — AB (ref 12.0–15.0)
MCH: 32 pg (ref 26.0–34.0)
MCHC: 29.7 g/dL — ABNORMAL LOW (ref 30.0–36.0)
MCV: 107.9 fL — ABNORMAL HIGH (ref 78.0–100.0)
PLATELETS: 226 10*3/uL (ref 150–400)
RBC: 3.56 MIL/uL — AB (ref 3.87–5.11)
RDW: 14 % (ref 11.5–15.5)
WBC: 20.5 10*3/uL — ABNORMAL HIGH (ref 4.0–10.5)

## 2016-12-28 LAB — RESPIRATORY PANEL BY PCR
ADENOVIRUS-RVPPCR: NOT DETECTED
Bordetella pertussis: NOT DETECTED
CORONAVIRUS HKU1-RVPPCR: NOT DETECTED
CORONAVIRUS NL63-RVPPCR: NOT DETECTED
CORONAVIRUS OC43-RVPPCR: NOT DETECTED
Chlamydophila pneumoniae: NOT DETECTED
Coronavirus 229E: NOT DETECTED
INFLUENZA A-RVPPCR: NOT DETECTED
Influenza B: NOT DETECTED
MYCOPLASMA PNEUMONIAE-RVPPCR: NOT DETECTED
Metapneumovirus: NOT DETECTED
PARAINFLUENZA VIRUS 1-RVPPCR: NOT DETECTED
PARAINFLUENZA VIRUS 3-RVPPCR: NOT DETECTED
PARAINFLUENZA VIRUS 4-RVPPCR: NOT DETECTED
Parainfluenza Virus 2: NOT DETECTED
RHINOVIRUS / ENTEROVIRUS - RVPPCR: NOT DETECTED
Respiratory Syncytial Virus: NOT DETECTED

## 2016-12-28 LAB — MRSA PCR SCREENING: MRSA by PCR: NEGATIVE

## 2016-12-28 LAB — GLUCOSE, CAPILLARY
GLUCOSE-CAPILLARY: 158 mg/dL — AB (ref 65–99)
GLUCOSE-CAPILLARY: 193 mg/dL — AB (ref 65–99)
Glucose-Capillary: 116 mg/dL — ABNORMAL HIGH (ref 65–99)
Glucose-Capillary: 172 mg/dL — ABNORMAL HIGH (ref 65–99)
Glucose-Capillary: 199 mg/dL — ABNORMAL HIGH (ref 65–99)

## 2016-12-28 LAB — PATHOLOGIST SMEAR REVIEW

## 2016-12-28 LAB — HIV ANTIBODY (ROUTINE TESTING W REFLEX): HIV Screen 4th Generation wRfx: NONREACTIVE

## 2016-12-28 MED ORDER — DILTIAZEM HCL 25 MG/5ML IV SOLN
10.0000 mg | Freq: Once | INTRAVENOUS | Status: AC
  Administered 2016-12-28: 10 mg via INTRAVENOUS
  Filled 2016-12-28: qty 5

## 2016-12-28 MED ORDER — KCL IN DEXTROSE-NACL 20-5-0.45 MEQ/L-%-% IV SOLN
INTRAVENOUS | Status: DC
  Administered 2016-12-28 – 2016-12-30 (×3): via INTRAVENOUS
  Administered 2016-12-30: 1 mL via INTRAVENOUS
  Administered 2016-12-31 – 2017-01-01 (×3): via INTRAVENOUS
  Filled 2016-12-28 (×10): qty 1000

## 2016-12-28 MED ORDER — VANCOMYCIN HCL 500 MG IV SOLR
500.0000 mg | INTRAVENOUS | Status: DC
  Administered 2016-12-28 – 2016-12-29 (×2): 500 mg via INTRAVENOUS
  Filled 2016-12-28 (×3): qty 500

## 2016-12-28 MED ORDER — INSULIN ASPART 100 UNIT/ML ~~LOC~~ SOLN
0.0000 [IU] | SUBCUTANEOUS | Status: DC
  Administered 2016-12-28 (×2): 2 [IU] via SUBCUTANEOUS
  Administered 2016-12-29: 3 [IU] via SUBCUTANEOUS
  Administered 2016-12-29 (×4): 2 [IU] via SUBCUTANEOUS
  Administered 2016-12-30: 1 [IU] via SUBCUTANEOUS
  Administered 2016-12-30 (×2): 2 [IU] via SUBCUTANEOUS
  Administered 2016-12-30: 1 [IU] via SUBCUTANEOUS
  Administered 2016-12-30 – 2016-12-31 (×3): 2 [IU] via SUBCUTANEOUS
  Administered 2016-12-31 – 2017-01-01 (×2): 1 [IU] via SUBCUTANEOUS
  Administered 2017-01-01: 2 [IU] via SUBCUTANEOUS

## 2016-12-28 MED ORDER — ORAL CARE MOUTH RINSE
15.0000 mL | Freq: Two times a day (BID) | OROMUCOSAL | Status: DC
  Administered 2016-12-28 – 2016-12-31 (×5): 15 mL via OROMUCOSAL

## 2016-12-28 MED ORDER — CHLORHEXIDINE GLUCONATE 0.12 % MT SOLN
15.0000 mL | Freq: Two times a day (BID) | OROMUCOSAL | Status: DC
  Administered 2016-12-28 – 2016-12-31 (×6): 15 mL via OROMUCOSAL
  Filled 2016-12-28 (×5): qty 15

## 2016-12-28 MED ORDER — DEXTROSE 5 % IV SOLN
1.0000 g | INTRAVENOUS | Status: DC
  Administered 2016-12-29 – 2016-12-31 (×3): 1 g via INTRAVENOUS
  Filled 2016-12-28 (×3): qty 1

## 2016-12-28 NOTE — Progress Notes (Signed)
Pharmacy Antibiotic Note  Brittney Powell is a 83 y.o. female on day # 2 Vancomycin and Cefepime for sepsis and HCAP coverage.  Temp and WBC trended down.  Low body weight, ~40 kg, creatinine is normal.  Creatinine clearance ~30-35 ml/ml.  Plan:  Decrease Vancomycin maintenance dose to 500 mg IV q24hrs.  Decrease Cefepime 1 gm IV from q12h to q24hrs.  Follow renal function, culture date, clinical progress.  Will plan to check Vanc trough level at steady state, after 3-4 doses.  Target Vanc trough levels 15-20 mcg/ml.  Height: 5\' 2"  (157.5 cm) Weight: 88 lb (39.9 kg) IBW/kg (Calculated) : 50.1  Temp (24hrs), Avg:99.1 F (37.3 C), Min:96.7 F (35.9 C), Max:103.6 F (39.8 C)  Recent Labs  Lab 12/27/16 1224 12/27/16 1236 12/27/16 1446 12/27/16 1447 12/27/16 1724 12/27/16 2024 12/28/16 0635  WBC 30.5*  --   --   --   --   --  20.5*  CREATININE 0.93  --  0.70  --   --   --  0.64  LATICACIDVEN  --  3.82*  --  3.15* 2.2* 1.2  --     Estimated Creatinine Clearance: 31.2 mL/min (by C-G formula based on SCr of 0.64 mg/dL).    Allergies  Allergen Reactions  . Penicillins Other (See Comments)    Unsure of reaction    Antimicrobials this admission:   Vancomycin 11/7>>   Cefepime 11/7>>  Dose adjustments this admission:  11/8: maintenance doses decreased per above due to low body weight and crcl ~30-35  Microbiology results:   11/7 blood x 2 -   11/7 urine -   11/7 MRSA PCR negative   11/7 Strep pneumo Ag negative   11/7 Legionella Ag - in process   11/7 HIV - in process   11/8 respiratory panel -  Thank you for allowing pharmacy to be a part of this patient's care.  Arty Baumgartner, [place] Pager: [phone] 12/28/2016 10:30 AM

## 2016-12-28 NOTE — Plan of Care (Signed)
Pt. Repositioned. No notable skin breakdown. Sacral foam in place.

## 2016-12-28 NOTE — Evaluation (Signed)
Clinical/Bedside Swallow Evaluation Patient Details  Name: Brittney Powell MRN: [id number] Date of Birth: [birth year]  Today's Date: 12/28/2016 Time: SLP Start Time (ACUTE ONLY): 6629 SLP Stop Time (ACUTE ONLY): 1327 SLP Time Calculation (min) (ACUTE ONLY): 40 min  Past Medical History:  Past Medical History:  Diagnosis Date  . Abnormality of gait 05/15/2013  . Atrial fibrillation ([place])   . Breast cancer ([place]) 1980   right  . Dementia   . Depression   . DM (diabetes mellitus) ([place])   . HTN (hypertension)   . Hypercholesterolemia   . Intrauterine pessary   . Memory difficulty 05/15/2013  . Osteoporosis   . TIA (transient ischemic attack)   . Wrist fracture 05/2011   Past Surgical History:  Past Surgical History:  Procedure Laterality Date  . MASTECTOMY MODIFIED RADICAL Right    30 years ago   HPI:  83 y.o.femalewith medical history significant for atrial fibrillation, breast cancer, dementia, depression, diabetes, hypertension, hyperlipidemia, TIA, recent hip fracture. Patient has had a gradual mental decline ever since breaking her hip and being placed in nursing home/rehabilitation facility. Patient is cared for regularly by her husband who states he is having increasing difficulty with feeding her. Per notes, she regularly coughs and chokes on her meals.Dx sepsis, severe protein-calorie malnutrition, aspiration pna.  Has been followed by SLP in past, 2016, with recommendations for thin liquids/mech soft and basic precautions.  More recently (last 90 days per husband), pt underwent FEES at SNF, which revealed aspiration of thin liquids and pharyngeal residue.  Dysphagia 1/nectars were recommended.     Assessment / Plan / Recommendation Clinical Impression  Pt presents with declining swallowing function - today, she demonstrates poor awareness, limited acceptance of any POs.  She has been demonstrating a bite reflex with presentation of straw, spoon, or [place] for oral hygiene.   With max tactile/verbal cues, she received a small bolus of pudding, which did not elicit active chewing; a spontaneous swallow response eventually was elicited.  Her swallowing ability is not functional at this time. Spoke at length with Mr. Ploch and his daughter-in-law regarding trajectory of swallowing in persons with dementia.  Knowing Palliative medicine has been consulted, reiterated the concept of their specialty and the help they can provide Mr. Parkhurst in decision-making.  He was very receptive to a discussion with their service.  SLP will follow along for [place] and f/u education if it is warranted. D/W RN.  SLP Visit Diagnosis: Dysphagia, unspecified (R13.10)    Aspiration Risk  Severe aspiration risk    Diet Recommendation   Pending meeting with Palliative medicine       Other  Recommendations Oral Care Recommendations: Oral care QID   Follow up Recommendations        Frequency and Duration            Prognosis Prognosis for Safe Diet Advancement: Guarded      Swallow Study   General HPI: 83 y.o.femalewith medical history significant for atrial fibrillation, breast cancer, dementia, depression, diabetes, hypertension, hyperlipidemia, TIA, recent hip fracture. Patient has had a gradual mental decline ever since breaking her hip and being placed in nursing home/rehabilitation facility. Patient is cared for regularly by her husband who states he is having increasing difficulty with feeding her. Per notes, she regularly coughs and chokes on her meals.Dx sepsis, severe protein-calorie malnutrition, aspiration pna.  Has been followed by SLP in past, 2016, with recommendations for thin liquids/mech soft and basic precautions.  More recently (last 90  days per husband), pt underwent FEES at SNF, which revealed aspiration of thin liquids and pharyngeal residue.  Dysphagia 1/nectars were recommended.   Type of Study: Bedside Swallow Evaluation Previous Swallow Assessment: see HPI Diet  Prior to this Study: NPO Temperature Spikes Noted: No Respiratory Status: Room air History of Recent Intubation: No Behavior/Cognition: Alert Oral Cavity Assessment: Other (comment)(unable to visualize) Oral Care Completed by SLP: No Self-Feeding Abilities: Total assist Patient Positioning: Upright in bed Baseline Vocal Quality: Low vocal intensity Volitional Cough: Cognitively unable to elicit Volitional Swallow: Unable to elicit    Oral/Motor/Sensory Function Overall Oral Motor/Sensory Function: Other (comment)(symmetry at rest)   Ice Chips Ice chips: Not tested   Thin Liquid Thin Liquid: Not tested    Nectar Thick Nectar Thick Liquid: Not tested   Honey Thick Honey Thick Liquid: Not tested   Puree Puree: Impaired Presentation: Spoon Oral Phase Impairments: Reduced labial seal;Reduced lingual movement/coordination;Impaired mastication;Poor awareness of bolus Oral Phase Functional Implications: Prolonged oral transit;Oral holding Pharyngeal Phase Impairments: Multiple swallows   Solid   GO   Solid: Not tested        Juan Quam Laurice 12/28/2016,1:46 PM  Estill Bamberg L. Tivis Ringer, [place] CCC/SLP Pager [phone]

## 2016-12-28 NOTE — Progress Notes (Signed)
Pt arrived to unit from ED. VS stable. Pt resting. Will continue to assess.

## 2016-12-28 NOTE — Progress Notes (Signed)
PROGRESS NOTE    Brittney Powell  TDD:[id number] DOB: 27-Apr-1927 DOA: 12/27/2016 PCP: [name], MD    Brief Narrative:   83 y.o. female with medical history significant of which fibrillation, breast cancer, dementia, depression, diabetes, hypertension, hyperlipidemia, suppresses, TIA, recent hip fracture. Patient has had a gradual mental decline ever since breaking her hip and being placed in nursing home/rehabilitation facility. Patient is cared to regularly by her husband who states he is having increasing difficulty with feeding her. Some1 to simple refused to eat all of the time she coughs and chokes on her meals, which Is very regularly.    Assessment & Plan:   Sepsis ([place]) - cefepime and vancomycin on board. - source suspected to be respiratory and urinary tract.  Active Problems:   HTN (hypertension) - stable currently.    Atrial fibrillation ([place]) - anticoagulated with Eliquis    Dementia without behavioral disturbance - stable continue namenda and remeron.    DM2 (diabetes mellitus, type 2) ([place]) - currently on MIVF's, will continue SSI q 4 hours    Severe protein-calorie malnutrition ([place]) - Will continue ensure    Acute lower UTI - Pt is on cefepime and vancomycin - will f/u with urine culture.    Aspiration pneumonia (H[facility]) - cefepime and vancomycin on board.  - placed order for speech therapy evaluation if not already done.    Acute on chronic alteration in mental status   Electrolyte abnormality   Dysphagia - speech therapy order placed. For eval. - Palliative consulted per HPI  DVT prophylaxis: Eliquis Code Status: DNR Family Communication: None at bedside Disposition Plan: pending improvement in condition   Consultants:   none   Procedures: None   Antimicrobials: Cefepime and Vancomycin   Subjective: Pt does not interact with examiner verbally.  Objective: Vitals:   12/28/16 0300 12/28/16 0312 12/28/16 0700 12/28/16 0825  BP:   (!) 119/99 105/67   Pulse: (!) 102 (!) 53 (!) 107   Resp: 15 16 15    Temp:  98.2 F (36.8 C) (!) 96.7 F (35.9 C) 99.2 F (37.3 C)  TempSrc:  Axillary Axillary Axillary  SpO2: 99% 100%    Weight:      Height:        Intake/Output Summary (Last 24 hours) at 12/28/2016 1051 Last data filed at 12/28/2016 0700 Gross per 24 hour  Intake 391.25 ml  Output 225 ml  Net 166.25 ml   Filed Weights   12/27/16 1322 12/27/16 2318  Weight: 41.3 kg (91 lb) 39.9 kg (88 lb)    Examination:  General exam: Appears calm and comfortable, in nad. Respiratory system: no audible wheezes, Equal chest rise. Cardiovascular system: S1 & S2 heard,  No JVD, murmurs, rubs, gallops or clicks. No pedal edema Gastrointestinal system: Abdomen is nondistended, soft and nontender. No organomegaly or masses felt. Normal bowel sounds heard. Central nervous system: gazes in my general direction, difficult exam due to AMS Extremities: warm and dry Skin: No rashes, lesions or ulcers, on limited exam. Psychiatry:  Unable to assess due to AMS  Data Reviewed: I have personally reviewed following labs and imaging studies  CBC: Recent Labs  Lab 12/27/16 1224 12/27/16 1446 12/28/16 0635  WBC 30.5*  --  20.5*  NEUTROABS 7.6  --   --   HGB 16.7* 11.9* 11.4*  HCT 52.8* 35.0* 38.4  MCV 103.1*  --  107.9*  PLT 329  --  623   Basic Metabolic Panel: Recent Labs  Lab 12/27/16  1224 12/27/16 1446 12/28/16 0635  NA 158* 159* 153*  K 4.0 3.7 3.8  CL 119* 122* 121*  CO2 29  --  27  GLUCOSE 151* 231* 176*  BUN 47* 44* 28*  CREATININE 0.93 0.70 0.64  CALCIUM 9.8  --  8.2*   GFR: Estimated Creatinine Clearance: 31.2 mL/min (by C-G formula based on SCr of 0.64 mg/dL). Liver Function Tests: Recent Labs  Lab 12/27/16 1224  AST 184*  ALT 130*  ALKPHOS 155*  BILITOT 1.2  PROT 7.4  ALBUMIN 3.9   No results for input(s): LIPASE, AMYLASE in the last 168 hours. No results for input(s): AMMONIA in the last 168  hours. Coagulation Profile: Recent Labs  Lab 12/27/16 1224 12/27/16 1724  INR 1.71 2.02   Cardiac Enzymes: No results for input(s): CKTOTAL, CKMB, CKMBINDEX, TROPONINI in the last 168 hours. BNP (last 3 results) No results for input(s): PROBNP in the last 8760 hours. HbA1C: No results for input(s): HGBA1C in the last 72 hours. CBG: Recent Labs  Lab 12/27/16 2121 12/27/16 2245 12/28/16 0802  GLUCAP 163* 149* 158*   Lipid Profile: No results for input(s): CHOL, HDL, LDLCALC, TRIG, CHOLHDL, LDLDIRECT in the last 72 hours. Thyroid Function Tests: No results for input(s): TSH, T4TOTAL, FREET4, T3FREE, THYROIDAB in the last 72 hours. Anemia Panel: No results for input(s): VITAMINB12, FOLATE, FERRITIN, TIBC, IRON, RETICCTPCT in the last 72 hours. Sepsis Labs: Recent Labs  Lab 12/27/16 1236 12/27/16 1447 12/27/16 1724 12/27/16 2024  PROCALCITON  --   --  0.11  --   LATICACIDVEN 3.82* 3.15* 2.2* 1.2    Recent Results (from the past 240 hour(s))  MRSA PCR Screening     Status: None   Collection Time: 12/27/16 11:10 PM  Result Value Ref Range Status   MRSA by PCR NEGATIVE NEGATIVE Final    Comment:        The GeneXpert MRSA Assay (FDA approved for NASAL specimens only), is one component of a comprehensive MRSA colonization surveillance program. It is not intended to diagnose MRSA infection nor to guide or monitor treatment for MRSA infections.       Radiology Studies: Dg Chest Port 1 View  Result Date: 12/27/2016 CLINICAL DATA:  Altered mental status EXAM: PORTABLE CHEST 1 VIEW COMPARISON:  11/29/2016 FINDINGS: Cardiac shadow is stable. Aortic calcifications are again noted. 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 Elevation of left hemidiaphragm is again seen. No focal infiltrate or sizable effusion is noted. Tiny calcified granuloma is noted laterally on the right. No bony abnormality is seen. IMPRESSION: Chronic changes without acute  abnormality. Electronically Signed   By: Inez Catalina M.D.   On: 12/27/2016 14:14     Scheduled Meds: . apixaban  2.5 mg Oral BID  . chlorhexidine  15 mL Mouth Rinse BID  . donepezil  5 mg Oral QHS  . feeding supplement (ENSURE ENLIVE)  237 mL Oral TID  . insulin aspart  0-5 Units Subcutaneous QHS  . insulin aspart  0-9 Units Subcutaneous TID WC  . mouth rinse  15 mL Mouth Rinse q12n4p  . memantine  28 mg Oral Daily  . mirtazapine  15 mg Oral QHS  . PARoxetine  40 mg Oral Daily   Continuous Infusions: . sodium chloride 75 mL/hr at 12/28/16 0300  . [START ON 12/29/2016] ceFEPime (MAXIPIME) IV    . vancomycin       LOS: 1 day    Time spent: > 35 min  Velvet Bathe, MD [facility] Pager [phone]  If 7PM-7AM, please contact night-coverage [email or web address] Password [email or web address] 12/28/2016, 10:51 AM

## 2016-12-28 NOTE — Progress Notes (Signed)
Nutrition Brief Note  Chart reviewed. Pt awaiting palliative care consult; per MD notes, pt is likely nearing end of life. Per SLP notes, pt swallow is non-functional and pt does not desire a feeding tube.  No further nutrition interventions warranted at this time.  Please re-consult as needed.   Ariona Deschene A. Jimmye Norman, RD, LDN, CDE Pager: [phone] After hours Pager: [phone]

## 2016-12-28 NOTE — Consult Note (Signed)
Consultation Note Date: 12/28/2016   Patient Name: Brittney Powell  DOB: 29-Jan-1928  MRN: [id number]  Age / Sex: 83 y.o., female  PCP: [name], MD Referring Physician: Velvet Bathe, MD  Reason for Consultation: Establishing goals of care and Psychosocial/spiritual support  HPI/Patient Profile: 83 y.o. female  with past medical history of dementia (full care, resides at [facility]), recent hip fracture, TIA, A. Fib on Eliquis, depression, DM2, HTN, and HLD. She presented with fatigue and decreased appetite. On exam she was found to be febrile with elevated WBC and lactic acid, and was in A. Fib with RVR. She was admitted on 12/27/2016. She is currently being treated for sepsis, etiology unclear but may be UTI vs. PNA. CXR clear, however she has known aspiration risk and ongoing episodes of overt aspiration. Per ED note, pt's husband confirms DNR status and that she would not want a feeding tube. Palliative consulted to support pt and family and help clarify goals of care with the potential for EOL care depending on clinical course.   Clinical Assessment and Goals of Care: Mrs. Molenda has dementia and is unable to participate in a goals of care conversation. I met with her husband and daughter-in-law at her bedside.   I introduced the concept of Palliative Care, clarified the difference between Palliative Care and Hospice, and explained my role on Mrs. Gilson's care team. Her family was very appreciative of my visit.  During this initial visit we talked through her health state prior to admission, and this hospitalization in terms of diagnoses and management. They had an excellent understanding of her dementia, and recognized it was incurable and progressive. They also understood her swallowing issues are chronic, have worsened, and put her at significant risk for recurrent infections. Mr. Oquendo biggest concern at this point is ensuring her comfort. We  talked through different types of care trajectories, including ongoing aggressive care versus a comfort focused approach. We talked at length about the role of Hospice in providing comfort focused care, including options for locations of care. At this point, Mr. Bray would like to continue the current level of care (IV fluids, abx, labs, etc.) to see if her alertness, energy, and intake will improve. We will meet on Saturday morning for a full family meeting, at which point we can evaluate her situation and decide on next steps. Given the tenor of our conversation, I suspect he will want Hospice support, and his wife will likely qualify for residential placement.   Of note, we did discuss oral intake. Given his desire to continue supportive care and her clear inability to safely and comfortably eat, I recommended continuing oral care as she will tolerate, but limiting her intake to ice chips. If she should perk-up or indicate a desire for food, a liberalized diet as tolerated would be appropriate given her husband's stated goals (i.e comfort feeding if she appears comfortable eating with no choking).    Primary Decision [employer], pt's husband.   SUMMARY OF RECOMMENDATIONS    DNR, continue current level of care  Family meeting on Saturday at 10am to discuss next steps, which I expect may be residential Hospice  Code Status/Advance Care Planning:  DNR  Symptom Management:   Pt appears comfortable, I discussed oral care with family and care nurse  Palliative Prophylaxis:   Frequent Pain Assessment, Oral Care and Turn Reposition  Psycho-social/Spiritual:   Desire for further Chaplaincy support:no  Additional Recommendations: Education on Hospice  Prognosis:   < 6  months. May be <2 weeks if minimal response to current medical interventions.  Discharge Planning: To Be Determined     Primary Diagnoses: Present on Admission: . Sepsis ([place]) . Severe protein-calorie malnutrition  ([place]) . HTN (hypertension) . Dementia without behavioral disturbance . Atrial fibrillation ([place])   I have reviewed the medical record, interviewed the patient and family, and examined the patient. The following aspects are pertinent.  Past Medical History:  Diagnosis Date  . Abnormality of gait 05/15/2013  . Atrial fibrillation ([place])   . Breast cancer ([place]) 1980   right  . Dementia   . Depression   . DM (diabetes mellitus) ([place])   . HTN (hypertension)   . Hypercholesterolemia   . Intrauterine pessary   . Memory difficulty 05/15/2013  . Osteoporosis   . TIA (transient ischemic attack)   . Wrist fracture 05/2011   Social History   Socioeconomic History  . Marital status: Married    Spouse name: [name]  . Number of children: 2  . Years of education: 9TH  . Highest education level: None  Social Needs  . Financial resource strain: None  . Food insecurity - worry: None  . Food insecurity - inability: None  . Transportation needs - medical: None  . Transportation needs - non-medical: None  Occupational History  . Occupation: Retired  Tobacco Use  . Smoking status: Never Smoker  . Smokeless tobacco: Never Used  Substance and Sexual Activity  . Alcohol use: No    Alcohol/week: 0.0 oz  . Drug use: No  . Sexual activity: None  Other Topics Concern  . None  Social History Narrative   Lives at [facility] since 08/30/14   Patient is married to [place]).   Retired.    Education high school.    Right handed.   Never smoked   Alcohol none   Full Code   Family History  Problem Relation Age of Onset  . Diabetes Father   . Cancer Father        leukemia  . Heart attack Brother   . Dementia Maternal Aunt    Scheduled Meds: . apixaban  2.5 mg Oral BID  . chlorhexidine  15 mL Mouth Rinse BID  . donepezil  5 mg Oral QHS  . feeding supplement (ENSURE ENLIVE)  237 mL Oral TID  . insulin aspart  0-5 Units Subcutaneous QHS  . insulin aspart  0-9 Units Subcutaneous TID WC    . mouth rinse  15 mL Mouth Rinse q12n4p  . memantine  28 mg Oral Daily  . mirtazapine  15 mg Oral QHS  . PARoxetine  40 mg Oral Daily   Continuous Infusions: . sodium chloride 75 mL/hr at 12/28/16 0300  . ceFEPime (MAXIPIME) IV Stopped (12/28/16 0229)  . vancomycin     PRN Meds:.acetaminophen **OR** acetaminophen, ALPRAZolam, ondansetron **OR** ondansetron (ZOFRAN) IV, polyethylene glycol, senna-docusate Allergies  Allergen Reactions  . Penicillins Other (See Comments)    Unsure of reaction   Review of Systems  Unable to perform ROS  Physical Exam  Constitutional: She appears lethargic. She has a sickly appearance. No distress.  Cachectic, frail woman  HENT:  Head: Normocephalic and atraumatic.  Mouth/Throat: Mucous membranes are dry (clamps mouth closed when oral care attempted).  Eyes: EOM are normal.  Neck: Normal range of motion. Neck supple.  Cardiovascular: Tachycardia present.  Pulmonary/Chest: Effort normal. She has decreased breath sounds in the right lower field and the left lower field. She has rhonchi.  Abdominal: Soft. Bowel sounds are decreased.  Musculoskeletal:  Frail and weak  Neurological: She appears lethargic.  Opens eyes to voice, but quickly falls back asleep and not interactive/following commands/answering questions  Skin: Skin is warm and dry. There is pallor.  Psychiatric:  Calm, but otherwise not interactive    Vital Signs: BP 105/67 (BP Location: Left Arm)   Pulse (!) 107   Temp 99.2 F (37.3 C) (Axillary)   Resp 15   Ht '5\' 2"'$  (1.575 m)   Wt 39.9 kg (88 lb)   SpO2 100%   BMI 16.10 kg/m  Pain Assessment: PAINAD     SpO2: SpO2: 100 % O2 Device:SpO2: 100 % O2 Flow Rate: .   IO: Intake/output summary:   Intake/Output Summary (Last 24 hours) at 12/28/2016 1026 Last data filed at 12/28/2016 0700 Gross per 24 hour  Intake 391.25 ml  Output 225 ml  Net 166.25 ml    LBM: Last BM Date: 12/27/16 Baseline Weight: Weight: 41.3 kg (91  lb) Most recent weight: Weight: 39.9 kg (88 lb)     Palliative Assessment/Data: PPS 10-20%    Time Total: 50 minutes Greater than 50%  of this time was spent counseling and coordinating care related to the above assessment and plan.  Signed by: Charlynn Court, NP Palliative Medicine Team Pager # [phone] (M-F 7a-5p) Team Phone # [phone] (Nights/Weekends)

## 2016-12-29 LAB — GLUCOSE, CAPILLARY
GLUCOSE-CAPILLARY: 140 mg/dL — AB (ref 65–99)
GLUCOSE-CAPILLARY: 167 mg/dL — AB (ref 65–99)
Glucose-Capillary: 190 mg/dL — ABNORMAL HIGH (ref 65–99)
Glucose-Capillary: 227 mg/dL — ABNORMAL HIGH (ref 65–99)
Glucose-Capillary: 172 mg/dL — ABNORMAL HIGH (ref 65–99)
Glucose-Capillary: 150 mg/dL — ABNORMAL HIGH (ref 65–99)

## 2016-12-29 LAB — LEGIONELLA PNEUMOPHILA SEROGP 1 UR AG: L. PNEUMOPHILA SEROGP 1 UR AG: NEGATIVE

## 2016-12-29 NOTE — Progress Notes (Signed)
PROGRESS NOTE    Brittney Powell  TOI:[id number] DOB: Apr 30, 1927 DOA: 12/27/2016 PCP: [name], MD    Brief Narrative:   83 y.o. female with medical history significant of which fibrillation, breast cancer, dementia, depression, diabetes, hypertension, hyperlipidemia, suppresses, TIA, recent hip fracture. Patient has had a gradual mental decline ever since breaking her hip and being placed in nursing home/rehabilitation facility. Patient is cared to regularly by her husband who states he is having increasing difficulty with feeding her. Some1 to simple refused to eat all of the time she coughs and chokes on her meals, which Is very regularly.    Assessment & Plan:   Sepsis ([place]) - cefepime and vancomycin on board. -  suspected to be respiratory and/or urinary tract. - Urine culture growing proteus mirabilis sensitivities pending - blood culture negative but pending.  Active Problems:   HTN (hypertension) - stable currently.    Atrial fibrillation ([place]) - anticoagulated with Eliquis    Dementia without behavioral disturbance - stable continue namenda and remeron.    DM2 (diabetes mellitus, type 2) ([place]) - currently on MIVF's, will continue SSI q 4 hours    Severe protein-calorie malnutrition ([place]) - Will continue ensure    Acute lower UTI - Pt is on cefepime and vancomycin - will f/u with urine culture.    Aspiration pneumonia (H[facility]) - cefepime and vancomycin on board.  - speech therapist consulted.    Acute on chronic alteration in mental status   Electrolyte abnormality   Dysphagia - speech therapy order placed. For eval. - Palliative consulted per HPI  DVT prophylaxis: Eliquis Code Status: DNR Family Communication: None at bedside Disposition Plan: pending improvement in condition. Palliative on board.   Consultants:   none   Procedures: None   Antimicrobials: Cefepime and Vancomycin   Subjective: Pt interacting with me more today. Smiling at  me.  Objective: Vitals:   12/28/16 1935 12/28/16 2312 12/29/16 0342 12/29/16 0738  BP: (!) 94/54 (!) 100/54 113/73 93/72  Pulse:  (!) 133 (!) 114   Resp: 17 16 13 18   Temp: (!) 97.4 F (36.3 C) 97.8 F (36.6 C) 97.8 F (36.6 C) 97.8 F (36.6 C)  TempSrc: Axillary Axillary Axillary Oral  SpO2:  100% 100% 100%  Weight:      Height:        Intake/Output Summary (Last 24 hours) at 12/29/2016 1130 Last data filed at 12/29/2016 1100 Gross per 24 hour  Intake 2075 ml  Output 325 ml  Net 1750 ml   Filed Weights   12/27/16 1322 12/27/16 2318  Weight: 41.3 kg (91 lb) 39.9 kg (88 lb)    Examination:  General exam: Appears calm and comfortable, in nad. Respiratory system: no audible wheezes, Equal chest rise. Cardiovascular system: S1 & S2 heard,  No JVD, murmurs, rubs, gallops or clicks. No pedal edema Gastrointestinal system: Abdomen is nondistended, soft and nontender. No organomegaly or masses felt. Normal bowel sounds heard. Central nervous system: gazes in my general direction and smiles at me, no facial asymmetry, difficult exam due to AMS Extremities: warm and dry Skin: No rashes, lesions or ulcers, on limited exam. Psychiatry:  Unable to assess due to AMS  Data Reviewed: I have personally reviewed following labs and imaging studies  CBC: Recent Labs  Lab 12/27/16 1224 12/27/16 1446 12/28/16 0635  WBC 30.5*  --  20.5*  NEUTROABS 7.6  --   --   HGB 16.7* 11.9* 11.4*  HCT 52.8* 35.0* 38.4  MCV  103.1*  --  107.9*  PLT 329  --  761   Basic Metabolic Panel: Recent Labs  Lab 12/27/16 1224 12/27/16 1446 12/28/16 0635 12/28/16 1328  NA 158* 159* 153* 150*  K 4.0 3.7 3.8 3.5  CL 119* 122* 121* 120*  CO2 29  --  27 24  GLUCOSE 151* 231* 176* 159*  BUN 47* 44* 28* 29*  CREATININE 0.93 0.70 0.64 0.54  CALCIUM 9.8  --  8.2* 8.2*   GFR: Estimated Creatinine Clearance: 31.2 mL/min (by C-G formula based on SCr of 0.54 mg/dL). Liver Function Tests: Recent Labs    Lab 12/27/16 1224  AST 184*  ALT 130*  ALKPHOS 155*  BILITOT 1.2  PROT 7.4  ALBUMIN 3.9   No results for input(s): LIPASE, AMYLASE in the last 168 hours. No results for input(s): AMMONIA in the last 168 hours. Coagulation Profile: Recent Labs  Lab 12/27/16 1224 12/27/16 1724  INR 1.71 2.02   Cardiac Enzymes: No results for input(s): CKTOTAL, CKMB, CKMBINDEX, TROPONINI in the last 168 hours. BNP (last 3 results) No results for input(s): PROBNP in the last 8760 hours. HbA1C: No results for input(s): HGBA1C in the last 72 hours. CBG: Recent Labs  Lab 12/28/16 1659 12/28/16 2045 12/28/16 2314 12/29/16 0344 12/29/16 0802  GLUCAP 172* 199* 193* 190* 227*   Lipid Profile: No results for input(s): CHOL, HDL, LDLCALC, TRIG, CHOLHDL, LDLDIRECT in the last 72 hours. Thyroid Function Tests: No results for input(s): TSH, T4TOTAL, FREET4, T3FREE, THYROIDAB in the last 72 hours. Anemia Panel: No results for input(s): VITAMINB12, FOLATE, FERRITIN, TIBC, IRON, RETICCTPCT in the last 72 hours. Sepsis Labs: Recent Labs  Lab 12/27/16 1236 12/27/16 1447 12/27/16 1724 12/27/16 2024  PROCALCITON  --   --  0.11  --   LATICACIDVEN 3.82* 3.15* 2.2* 1.2    Recent Results (from the past 240 hour(s))  Culture, blood (Routine x 2)     Status: None (Preliminary result)   Collection Time: 12/27/16 12:30 PM  Result Value Ref Range Status   Specimen Description BLOOD BLOOD LEFT FOREARM  Final   Special Requests   Final    BOTTLES DRAWN AEROBIC AND ANAEROBIC Blood Culture adequate volume   Culture NO GROWTH < 24 HOURS  Final   Report Status PENDING  Incomplete  Culture, blood (Routine x 2)     Status: None (Preliminary result)   Collection Time: 12/27/16  1:05 PM  Result Value Ref Range Status   Specimen Description BLOOD RIGHT FOREARM  Final   Special Requests   Final    BOTTLES DRAWN AEROBIC AND ANAEROBIC Blood Culture adequate volume   Culture NO GROWTH < 24 HOURS  Final   Report  Status PENDING  Incomplete  Urine culture     Status: Abnormal (Preliminary result)   Collection Time: 12/27/16  1:22 PM  Result Value Ref Range Status   Specimen Description URINE, CATHETERIZED  Final   Special Requests NONE  Final   Culture >=100,000 COLONIES/mL PROTEUS MIRABILIS (A)  Final   Report Status PENDING  Incomplete  MRSA PCR Screening     Status: None   Collection Time: 12/27/16 11:10 PM  Result Value Ref Range Status   MRSA by PCR NEGATIVE NEGATIVE Final    Comment:        The GeneXpert MRSA Assay (FDA approved for NASAL specimens only), is one component of a comprehensive MRSA colonization surveillance program. It is not intended to diagnose MRSA infection nor to guide  or monitor treatment for MRSA infections.   Respiratory Panel by PCR     Status: None   Collection Time: 12/28/16  4:19 AM  Result Value Ref Range Status   Adenovirus NOT DETECTED NOT DETECTED Final   Coronavirus 229E NOT DETECTED NOT DETECTED Final   Coronavirus HKU1 NOT DETECTED NOT DETECTED Final   Coronavirus NL63 NOT DETECTED NOT DETECTED Final   Coronavirus OC43 NOT DETECTED NOT DETECTED Final   Metapneumovirus NOT DETECTED NOT DETECTED Final   Rhinovirus / Enterovirus NOT DETECTED NOT DETECTED Final   Influenza A NOT DETECTED NOT DETECTED Final   Influenza B NOT DETECTED NOT DETECTED Final   Parainfluenza Virus 1 NOT DETECTED NOT DETECTED Final   Parainfluenza Virus 2 NOT DETECTED NOT DETECTED Final   Parainfluenza Virus 3 NOT DETECTED NOT DETECTED Final   Parainfluenza Virus 4 NOT DETECTED NOT DETECTED Final   Respiratory Syncytial Virus NOT DETECTED NOT DETECTED Final   Bordetella pertussis NOT DETECTED NOT DETECTED Final   Chlamydophila pneumoniae NOT DETECTED NOT DETECTED Final   Mycoplasma pneumoniae NOT DETECTED NOT DETECTED Final      Radiology Studies: Dg Chest Port 1 View  Result Date: 12/27/2016 CLINICAL DATA:  Altered mental status EXAM: PORTABLE CHEST 1 VIEW COMPARISON:   11/29/2016 FINDINGS: Cardiac shadow is stable. Aortic calcifications are again noted. 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 Elevation of left hemidiaphragm is again seen. No focal infiltrate or sizable effusion is noted. Tiny calcified granuloma is noted laterally on the right. No bony abnormality is seen. IMPRESSION: Chronic changes without acute abnormality. Electronically Signed   By: Inez Catalina M.D.   On: 12/27/2016 14:14     Scheduled Meds: . apixaban  2.5 mg Oral BID  . chlorhexidine  15 mL Mouth Rinse BID  . donepezil  5 mg Oral QHS  . insulin aspart  0-5 Units Subcutaneous QHS  . insulin aspart  0-9 Units Subcutaneous Q4H  . mouth rinse  15 mL Mouth Rinse q12n4p  . memantine  28 mg Oral Daily  . mirtazapine  15 mg Oral QHS  . PARoxetine  40 mg Oral Daily   Continuous Infusions: . ceFEPime (MAXIPIME) IV Stopped (12/29/16 0522)  . dextrose 5 % and 0.45 % NaCl with KCl 20 mEq/L 100 mL/hr at 12/28/16 2311  . vancomycin Stopped (12/28/16 1614)     LOS: 2 days    Time spent: > 35 min  Velvet Bathe, MD [facility] Pager [phone]  If 7PM-7AM, please contact night-coverage [email or web address] Password [email or web address] 12/29/2016, 11:30 AM

## 2016-12-29 NOTE — Progress Notes (Signed)
SLP Cancellation Note  Patient Details Name: Brittney Powell MRN: [id number] DOB: 1928-01-14   Cancelled treatment:       Reason Eval/Treat Not Completed: Other (comment) - Pt has been transitioned to comfort feeds after family discussions with Palliative care.  Our services are no longer warranted - we will respectfully sign-off.   Brittney Powell 12/29/2016, 2:22 PM

## 2016-12-29 NOTE — Progress Notes (Signed)
Daily Progress Note   Patient Name: Brittney Powell       Date: 12/29/2016 DOB: 23-Jul-1927  Age: 83 y.o. MRN#: [id number] Attending Physician: Velvet Bathe, MD Primary Care Physician: [name], MD Admit Date: 12/27/2016  Reason for Consultation/Follow-up: Establishing goals of care, Non pain symptom management and Psychosocial/spiritual support  Subjective: Brittney Powell is much more awake, alert, and joyfully interactive today.  She has clearly perked-up with IV fluids and antibiotics. She does continue to refuse food and oral care when offered. Her husband and daughter-in-law are at her bedside. Her husband is very concerned about her nutritional status.   Length of Stay: 2  Current Medications: Scheduled Meds:  . apixaban  2.5 mg Oral BID  . chlorhexidine  15 mL Mouth Rinse BID  . donepezil  5 mg Oral QHS  . insulin aspart  0-5 Units Subcutaneous QHS  . insulin aspart  0-9 Units Subcutaneous Q4H  . mouth rinse  15 mL Mouth Rinse q12n4p  . memantine  28 mg Oral Daily  . mirtazapine  15 mg Oral QHS  . PARoxetine  40 mg Oral Daily    Continuous Infusions: . ceFEPime (MAXIPIME) IV Stopped (12/29/16 0522)  . dextrose 5 % and 0.45 % NaCl with KCl 20 mEq/L 100 mL/hr at 12/28/16 2311  . vancomycin Stopped (12/28/16 1614)    PRN Meds: acetaminophen **OR** acetaminophen, ALPRAZolam, ondansetron **OR** ondansetron (ZOFRAN) IV, polyethylene glycol, senna-docusate  Physical Exam     Constitutional: She appears alert. She has a sickly appearance. No distress.  Cachectic, frail woman. Joyful and interactive today.  HENT:  Head: Normocephalic and atraumatic.  Mouth/Throat: Mucous membranes are dry (clamps mouth closed when oral care attempted).  Eyes: EOM are normal.  Neck: Normal range of motion. Neck supple.  Cardiovascular:  Tachycardia present.  Pulmonary/Chest: Effort normal. She has decreased breath sounds in the right lower field and the left lower field. She has rhonchi.  Abdominal: Soft. Bowel sounds are decreased.  Musculoskeletal:  Frail and weak  Neurological: She appears alert.  Interactive and smiling, words are incomprehensible. Not following commands. Skin: Skin is warm and dry. There is pallor.  Psychiatric:  Calm and happy in bed.   Vital Signs: BP 93/72 (BP Location: Left Arm)   Pulse (!) 114   Temp 97.8 F (36.6 C) (Oral)   Resp 18   Ht 5\' 2"  (1.575 m)   Wt 39.9 kg (88 lb)   SpO2 100%   BMI 16.10 kg/m  SpO2: SpO2: 100 % O2 Device: O2 Device: Nasal Cannula O2 Flow Rate: O2 Flow Rate (L/min): 3 L/min  Intake/output summary:   Intake/Output Summary (Last 24 hours) at 12/29/2016 1123 Last data filed at 12/29/2016 1100 Gross per 24 hour  Intake 2075 ml  Output 325 ml  Net 1750 ml   LBM: Last BM Date: 12/27/16 Baseline Weight: Weight: 41.3 kg (91 lb) Most recent weight: Weight: 39.9 kg (88 lb)  Palliative Assessment/Data: PPS 20%    Patient Active Problem List   Diagnosis Date Noted  . Pressure injury of skin 12/28/2016  . Dehydration   . Goals of care, counseling/discussion   . Palliative care by specialist   . Sepsis (  [place]) 12/27/2016  . Acute lower UTI 12/27/2016  . Aspiration pneumonia ([place]) 12/27/2016  . Acute on chronic alteration in mental status 12/27/2016  . Electrolyte abnormality 12/27/2016  . Dysphagia 12/27/2016  . Severe protein-calorie malnutrition ([place]) 06/08/2016  . Recurrent major depressive disorder, in partial remission ([place]) 06/08/2016  . Dementia without behavioral disturbance 07/22/2015  . Protein-calorie malnutrition ([place]) 07/22/2015  . DM2 (diabetes mellitus, type 2) ([place]) 07/22/2015  . Hyperlipidemia 07/22/2015  . Controlled type 2 diabetes mellitus without complication, without long-term current use of insulin ([place]) 03/03/2015  . Atrial  fibrillation ([place]) 01/17/2015  . Anxiety state 09/09/2014  . Goiter diffuse   . Displaced intertrochanteric fracture of left femur ([place]) 08/25/2014  . Dementia 08/25/2014  . Diabetes mellitus type II, uncontrolled ([place]) 08/25/2014  . Fall 08/25/2014  . Atrial fibrillation with RVR (new onset) 08/25/2014  . HTN (hypertension) 08/25/2014  . Elevated d-dimer 08/25/2014  . Left displaced femoral neck fracture ([place]) 08/25/2014  . Goiter 08/25/2014  . Memory difficulty 05/15/2013  . Abnormality of gait 05/15/2013  . Loss of weight 05/15/2013    Palliative Care Assessment & Plan   HPI: 83 y.o. female  with past medical history of dementia (full care, resides at [facility]), recent hip fracture, TIA, A. Fib on Eliquis, depression, DM2, HTN, and HLD. She presented with fatigue and decreased appetite. On exam she was found to be febrile with elevated WBC and lactic acid, and was in A. Fib with RVR. She was admitted on 12/27/2016. She is currently being treated for sepsis, etiology unclear but may be UTI vs. PNA. CXR clear, however she has known aspiration risk and ongoing episodes of overt aspiration. Per ED note, pt's husband confirms DNR status and that she would not want a feeding tube. Palliative consulted to support pt and family and help clarify goals of care with the potential for EOL care depending on clinical course.   Assessment: Brittney Powell has significantly perked-up compared to yesterday. 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 She also continues to refuse oral care.   I had a good conversation with Mr. Ruffino and their daughter-in-law regarding food/oral intake. We talked again about the natural progression of dementia and how it results in declining appetite and eventually refusing food. I also reinforced the patient's previously documented desires for no artificial nutrition via a PEG tube. Given  Mr. Magouirk clear distress that she will be hungry with our diet restrictions (NPO given severe aspiration risk), we talked about the option for allowing comfort feeds, with the acknowledgement of aspiration risk. He felt comfort feeds was a reasonable option, and was very appreciative of the education I provided about safe feeding and signs that indicate a safety concern.   Recommendations/Plan:  DNR, continue current level of care  Diet order placed with allowance of comfort feeds, family acknowledges risk  Plan for family meeting 11/10 at 10am   Code Status:  DNR  Prognosis:   < 6 months  Discharge Planning:  To Be Determined  Care plan was discussed with pt's husband and daughter-in-law  Thank you for allowing the Palliative Medicine Team to assist in the care of this patient.  Total time: 35 minutes    Greater than 50%  of this time was spent counseling and coordinating care related to the above assessment and plan.  Charlynn Court, NP Palliative Medicine Team [phone] pager (7a-5p) Team Phone #  [phone]  

## 2016-12-29 NOTE — Care Management Note (Addendum)
Case Management Note  Patient Details  Name: Brittney Powell MRN: [id number] Date of Birth: 11/13/1927  Subjective/Objective:     Pt admitted with AMS               Action/Plan:  PTA from [facility] - per husband pt is non verbal and unable to communicate at baseline.  Palliative has been consulted - meeting scheduled 11/10.  CM will continue to follow for discharge needs   Expected Discharge Date:                  Expected Discharge Plan:  [place]  In-House Referral:  Clinical Social Work  Discharge planning Services     Post Acute Care Choice:    Choice offered to:     DME Arranged:    DME Agency:     HH Arranged:    [place] Agency:     Status of Service:     If discussed at [employer], dates discussed:    Additional Comments:  Maryclare Labrador, RN 12/29/2016, 2:51 PM

## 2016-12-30 LAB — GLUCOSE, CAPILLARY
GLUCOSE-CAPILLARY: 132 mg/dL — AB (ref 65–99)
GLUCOSE-CAPILLARY: 158 mg/dL — AB (ref 65–99)
Glucose-Capillary: 162 mg/dL — ABNORMAL HIGH (ref 65–99)
Glucose-Capillary: 136 mg/dL — ABNORMAL HIGH (ref 65–99)
Glucose-Capillary: 111 mg/dL — ABNORMAL HIGH (ref 65–99)
Glucose-Capillary: 157 mg/dL — ABNORMAL HIGH (ref 65–99)

## 2016-12-30 NOTE — Progress Notes (Signed)
Daily Progress Note   Patient Name: Brittney Powell       Date: 12/30/2016 DOB: 08-30-1927  Age: 83 y.o. MRN#: [id number] Attending Physician: Velvet Bathe, MD Primary Care Physician: [name], MD Admit Date: 12/27/2016  Reason for Consultation/Follow-up: Establishing goals of care, Non pain symptom management and Psychosocial/spiritual support  Subjective: Brittney Powell is better compared to admission, but slightly more sleepy and less interactive today. I liberalized her diet for comfort, however her family (and confirmed with care nurse) reports that she has only had a few bites of food and two spoonfuls of [thickened] liquid. She has been refusing further attempts to feed her or do oral care.   Length of Stay: 3  Current Medications: Scheduled Meds:  . apixaban  2.5 mg Oral BID  . chlorhexidine  15 mL Mouth Rinse BID  . donepezil  5 mg Oral QHS  . insulin aspart  0-5 Units Subcutaneous QHS  . insulin aspart  0-9 Units Subcutaneous Q4H  . mouth rinse  15 mL Mouth Rinse q12n4p  . memantine  28 mg Oral Daily  . mirtazapine  15 mg Oral QHS  . PARoxetine  40 mg Oral Daily    Continuous Infusions: . ceFEPime (MAXIPIME) IV Stopped (12/30/16 0420)  . dextrose 5 % and 0.45 % NaCl with KCl 20 mEq/L 100 mL/hr at 12/30/16 0859    PRN Meds: acetaminophen **OR** acetaminophen, ALPRAZolam, ondansetron **OR** ondansetron (ZOFRAN) IV, polyethylene glycol, senna-docusate  Physical Exam     Constitutional: She appears alert. She has a sickly appearance. No distress.  Cachectic, frail woman. More sleepy today. HENT:  Head: Normocephalic and atraumatic.  Mouth/Throat: Mucous membranes are dry (clamps mouth closed when oral care attempted).  Eyes: EOM are normal.  Neck: Normal range of motion. Neck supple.  Cardiovascular: Tachycardia  present.  Pulmonary/Chest: Effort normal. She has decreased breath sounds in the right lower field and the left lower field. She has rhonchi.  Abdominal: Soft. Bowel sounds are decreased.  Musculoskeletal:  Frail and weak  Neurological: She appears alert.  More sleepy today. She will interact with smiles and facial expressions, but more sedate and less joyful today. Skin: Skin is warm and dry. There is pallor.  Psychiatric:  Calm   Vital Signs: BP 102/60 (BP Location: Left Arm)   Pulse (!) 111   Temp 98 F (36.7 C) (Axillary)   Resp 13   Ht 5\' 2"  (1.575 m)   Wt 39.9 kg (88 lb)   SpO2 100%   BMI 16.10 kg/m  SpO2: SpO2: 100 % O2 Device: O2 Device: Nasal Cannula O2 Flow Rate: O2 Flow Rate (L/min): 2 L/min  Intake/output summary:   Intake/Output Summary (Last 24 hours) at 12/30/2016 1247 Last data filed at 12/30/2016 1200 Gross per 24 hour  Intake 2400 ml  Output 725 ml  Net 1675 ml   LBM: Last BM Date: 12/27/16 Baseline Weight: Weight: 41.3 kg (91 lb) Most recent weight: Weight: 39.9 kg (88 lb)  Palliative Assessment/Data: PPS 20%    Patient Active Problem List   Diagnosis Date Noted  . Pressure injury of skin 12/28/2016  . Dehydration   . Goals of care, counseling/discussion   . Palliative  care by specialist   . Sepsis ([place]) 12/27/2016  . Acute lower UTI 12/27/2016  . Aspiration pneumonia ([place]) 12/27/2016  . Acute on chronic alteration in mental status 12/27/2016  . Electrolyte abnormality 12/27/2016  . Dysphagia 12/27/2016  . Severe protein-calorie malnutrition ([place]) 06/08/2016  . Recurrent major depressive disorder, in partial remission ([place]) 06/08/2016  . Dementia without behavioral disturbance 07/22/2015  . Protein-calorie malnutrition ([place]) 07/22/2015  . DM2 (diabetes mellitus, type 2) ([place]) 07/22/2015  . Hyperlipidemia 07/22/2015  . Controlled type 2 diabetes mellitus without complication, without long-term current use of insulin ([place]) 03/03/2015  .  Atrial fibrillation ([place]) 01/17/2015  . Anxiety state 09/09/2014  . Goiter diffuse   . Displaced intertrochanteric fracture of left femur ([place]) 08/25/2014  . Dementia 08/25/2014  . Diabetes mellitus type II, uncontrolled ([place]) 08/25/2014  . Fall 08/25/2014  . Atrial fibrillation with RVR (new onset) 08/25/2014  . HTN (hypertension) 08/25/2014  . Elevated d-dimer 08/25/2014  . Left displaced femoral neck fracture ([place]) 08/25/2014  . Goiter 08/25/2014  . Memory difficulty 05/15/2013  . Abnormality of gait 05/15/2013  . Loss of weight 05/15/2013    Palliative Care Assessment & Plan   HPI: 83 y.o. female  with past medical history of dementia (full care, resides at [facility]), recent hip fracture, TIA, A. Fib on Eliquis, depression, DM2, HTN, and HLD. She presented with fatigue and decreased appetite. On exam she was found to be febrile with elevated WBC and lactic acid, and was in A. Fib with RVR. She was admitted on 12/27/2016. She is currently being treated for sepsis, etiology unclear but may be UTI vs. PNA. CXR clear, however she has known aspiration risk and ongoing episodes of overt aspiration. Per ED note, pt's husband confirms DNR status and that she would not want a feeding tube. Palliative consulted to support pt and family and help clarify goals of care with the potential for EOL care depending on clinical course.   Assessment: I had a long meeting today with the patient's husband, two children, and daughter-in-law. We talked through her chronic medical issues, the acute issues, and the likely trajectory of her health. We then discussed the two different trajectories of care moving forward. This was a repeat conversation I initially had with the patient's husband and daughter-in-law. I shared the aggressive approach to care versus the comfort focused approach to care. I also clarified where care could occur in both of these paths (aggressive: SNF with palliative; comfort: residential  hospice vs SNF with hospice). After an extensive discussion, Mr. Uber was able to verbalize his desire for a comfort focused approach to care. He is very realistic about her progressive decline, and the poor prognosis it portends. His family supported him, though it took some time to get them there.   In terms of location of care, I believe she would be residential hospice appropriate. Mr. Muller did want to medically optimize her before discharge (finish abx course and continue IV fluids until discharge), however this period of "perkyness" I would expect to be very short lived. Her intake is negligible, she has a severe aspiration risk, and in short order I would expect her to be dehydrated and have a repeat infection. I did share this with her family. In light of how little time she has (I would expect 2 weeks or less), they would prefer she have the full comfort and support afforded by a residential Hospice house. They do understand that if things should change, such  as an increase in her eating/drinking, she would not be residential hospice appropriate. In that event, their next choice would be to go to a nursing facility with hospice following.   Recommendations/Plan:  DNR, plan to medically optimize (finish abx and continue IV fluids) then discharge to either residential hospice or SNF with [place] notified and asked to evaluate for residential eligibility  SW consult placed to begin investigating SNF options as a back-up option  Continue comfort feeds, family acknowledges risk  Code Status:  DNR  Prognosis:   < 2 weeks in the setting of advanced dementia, minimal intake, severe aspiration risk, and high likelihood of recurrent infections.   Discharge Planning:  First choice is residential hospice, second choice is SNF with hospice  Care plan was discussed with pt's husband, son and daughter, and daughter-in-law  Thank you for allowing the Palliative Medicine Team to  assist in the care of this patient.  Time in: 1005 Time out: 1105 Total time: 75 minutes    Greater than 50%  of this time was spent counseling and coordinating care related to the above assessment and plan.  Charlynn Court, NP Palliative Medicine Team [phone] pager (7a-5p) Team Phone # [phone]

## 2016-12-30 NOTE — Progress Notes (Signed)
PROGRESS NOTE    Brittney Powell  PXT:[id number] DOB: [birth year] DOA: 12/27/2016 PCP: [name], MD    Brief Narrative:   83 y.o. female with medical history significant of which fibrillation, breast cancer, dementia, depression, diabetes, hypertension, hyperlipidemia, suppresses, TIA, recent hip fracture. Patient has had a gradual mental decline ever since breaking her hip and being placed in nursing home/rehabilitation facility. Patient is cared to regularly by her husband who states he is having increasing difficulty with feeding her.    Assessment & Plan:   Sepsis ([place]) - cefepime and vancomycin on board.  - suspected to be respiratory and/or urinary tract. - Urine culture growing proteus mirabilis sensitivities reviewed. Will d/c vancomycin - blood culture Still negative but pending  Active Problems:   HTN (hypertension) - stable currently.    Atrial fibrillation ([place]) - anticoagulated with Eliquis    Dementia without behavioral disturbance - stable continue namenda and remeron.    DM2 (diabetes mellitus, type 2) ([place]) - currently on MIVF's, will continue SSI q 4 hours    Severe protein-calorie malnutrition ([place]) - Will continue ensure    Acute lower UTI - Pt is on cefepime and vancomycin - will f/u with urine culture.    Aspiration pneumonia ([facility]) - cefepime and vancomycin on board.  - speech therapist consulted.    Acute on chronic alteration in mental status   Electrolyte abnormality   Dysphagia - speech therapy order placed. For eval. - Palliative consulted per HPI  DVT prophylaxis: Eliquis Code Status: DNR Family Communication: None at bedside Disposition Plan: pending improvement in condition. Palliative on board and meeting ensuing.   Consultants:   Palliative Care   Procedures: None   Antimicrobials: Cefepime    Subjective: Pt continues to interact better with me today.  Objective: Vitals:   12/30/16 0324 12/30/16 0748 12/30/16  0750 12/30/16 1156  BP: 111/72 106/79 106/79   Pulse: (!) 107 (!) 116    Resp: 14 12 13    Temp: 98.3 F (36.8 C) 98.2 F (36.8 C)  98 F (36.7 C)  TempSrc: Oral Oral  Axillary  SpO2: 93% 100%    Weight:      Height:        Intake/Output Summary (Last 24 hours) at 12/30/2016 1202 Last data filed at 12/30/2016 1100 Gross per 24 hour  Intake 2300 ml  Output 725 ml  Net 1575 ml   Filed Weights   12/27/16 1322 12/27/16 2318  Weight: 41.3 kg (91 lb) 39.9 kg (88 lb)    Examination: exam unchanged when compared to 12/29/16  General exam: Appears calm and comfortable, in nad. Respiratory system: no audible wheezes, Equal chest rise. Cardiovascular system: S1 & S2 heard,  No JVD, murmurs, rubs, gallops or clicks. No pedal edema Gastrointestinal system: Abdomen is nondistended, soft and nontender. No organomegaly or masses felt. Normal bowel sounds heard. Central nervous system: gazes in my general direction and smiles at me, no facial asymmetry, difficult exam due to AMS Extremities: warm and dry Skin: No rashes, lesions or ulcers, on limited exam. Psychiatry:  Unable to assess due to AMS  Data Reviewed: I have personally reviewed following labs and imaging studies  CBC: Recent Labs  Lab 12/27/16 1224 12/27/16 1446 12/28/16 0635  WBC 30.5*  --  20.5*  NEUTROABS 7.6  --   --   HGB 16.7* 11.9* 11.4*  HCT 52.8* 35.0* 38.4  MCV 103.1*  --  107.9*  PLT 329  --  226   Basic  Metabolic Panel: Recent Labs  Lab 12/27/16 1224 12/27/16 1446 12/28/16 0635 12/28/16 1328  NA 158* 159* 153* 150*  K 4.0 3.7 3.8 3.5  CL 119* 122* 121* 120*  CO2 29  --  27 24  GLUCOSE 151* 231* 176* 159*  BUN 47* 44* 28* 29*  CREATININE 0.93 0.70 0.64 0.54  CALCIUM 9.8  --  8.2* 8.2*   GFR: Estimated Creatinine Clearance: 31.2 mL/min (by C-G formula based on SCr of 0.54 mg/dL). Liver Function Tests: Recent Labs  Lab 12/27/16 1224  AST 184*  ALT 130*  ALKPHOS 155*  BILITOT 1.2  PROT 7.4   ALBUMIN 3.9   No results for input(s): LIPASE, AMYLASE in the last 168 hours. No results for input(s): AMMONIA in the last 168 hours. Coagulation Profile: Recent Labs  Lab 12/27/16 1224 12/27/16 1724  INR 1.71 2.02   Cardiac Enzymes: No results for input(s): CKTOTAL, CKMB, CKMBINDEX, TROPONINI in the last 168 hours. BNP (last 3 results) No results for input(s): PROBNP in the last 8760 hours. HbA1C: No results for input(s): HGBA1C in the last 72 hours. CBG: Recent Labs  Lab 12/29/16 1947 12/29/16 2315 12/30/16 0327 12/30/16 0838 12/30/16 1154  GLUCAP 140* 167* 162* 132* 136*   Lipid Profile: No results for input(s): CHOL, HDL, LDLCALC, TRIG, CHOLHDL, LDLDIRECT in the last 72 hours. Thyroid Function Tests: No results for input(s): TSH, T4TOTAL, FREET4, T3FREE, THYROIDAB in the last 72 hours. Anemia Panel: No results for input(s): VITAMINB12, FOLATE, FERRITIN, TIBC, IRON, RETICCTPCT in the last 72 hours. Sepsis Labs: Recent Labs  Lab 12/27/16 1236 12/27/16 1447 12/27/16 1724 12/27/16 2024  PROCALCITON  --   --  0.11  --   LATICACIDVEN 3.82* 3.15* 2.2* 1.2    Recent Results (from the past 240 hour(s))  Culture, blood (Routine x 2)     Status: None (Preliminary result)   Collection Time: 12/27/16 12:30 PM  Result Value Ref Range Status   Specimen Description BLOOD BLOOD LEFT FOREARM  Final   Special Requests   Final    BOTTLES DRAWN AEROBIC AND ANAEROBIC Blood Culture adequate volume   Culture NO GROWTH 2 DAYS  Final   Report Status PENDING  Incomplete  Culture, blood (Routine x 2)     Status: None (Preliminary result)   Collection Time: 12/27/16  1:05 PM  Result Value Ref Range Status   Specimen Description BLOOD RIGHT FOREARM  Final   Special Requests   Final    BOTTLES DRAWN AEROBIC AND ANAEROBIC Blood Culture adequate volume   Culture NO GROWTH 2 DAYS  Final   Report Status PENDING  Incomplete  Urine culture     Status: Abnormal (Preliminary result)    Collection Time: 12/27/16  1:22 PM  Result Value Ref Range Status   Specimen Description URINE, CATHETERIZED  Final   Special Requests NONE  Final   Culture (A)  Final    >=100,000 COLONIES/mL PROTEUS MIRABILIS CULTURE REINCUBATED FOR BETTER GROWTH    Report Status PENDING  Incomplete   Organism ID, Bacteria PROTEUS MIRABILIS (A)  Final      Susceptibility   Proteus mirabilis - MIC*    AMPICILLIN <=2 SENSITIVE Sensitive     CEFAZOLIN <=4 SENSITIVE Sensitive     CEFTRIAXONE <=1 SENSITIVE Sensitive     CIPROFLOXACIN >=4 RESISTANT Resistant     GENTAMICIN <=1 SENSITIVE Sensitive     IMIPENEM 1 SENSITIVE Sensitive     NITROFURANTOIN >=512 RESISTANT Resistant     TRIMETH/SULFA <=  20 SENSITIVE Sensitive     AMPICILLIN/SULBACTAM <=2 SENSITIVE Sensitive     PIP/TAZO <=4 SENSITIVE Sensitive     * >=100,000 COLONIES/mL PROTEUS MIRABILIS  MRSA PCR Screening     Status: None   Collection Time: 12/27/16 11:10 PM  Result Value Ref Range Status   MRSA by PCR NEGATIVE NEGATIVE Final    Comment:        The GeneXpert MRSA Assay (FDA approved for NASAL specimens only), is one component of a comprehensive MRSA colonization surveillance program. It is not intended to diagnose MRSA infection nor to guide or monitor treatment for MRSA infections.   Respiratory Panel by PCR     Status: None   Collection Time: 12/28/16  4:19 AM  Result Value Ref Range Status   Adenovirus NOT DETECTED NOT DETECTED Final   Coronavirus 229E NOT DETECTED NOT DETECTED Final   Coronavirus HKU1 NOT DETECTED NOT DETECTED Final   Coronavirus NL63 NOT DETECTED NOT DETECTED Final   Coronavirus OC43 NOT DETECTED NOT DETECTED Final   Metapneumovirus NOT DETECTED NOT DETECTED Final   Rhinovirus / Enterovirus NOT DETECTED NOT DETECTED Final   Influenza A NOT DETECTED NOT DETECTED Final   Influenza B NOT DETECTED NOT DETECTED Final   Parainfluenza Virus 1 NOT DETECTED NOT DETECTED Final   Parainfluenza Virus 2 NOT DETECTED  NOT DETECTED Final   Parainfluenza Virus 3 NOT DETECTED NOT DETECTED Final   Parainfluenza Virus 4 NOT DETECTED NOT DETECTED Final   Respiratory Syncytial Virus NOT DETECTED NOT DETECTED Final   Bordetella pertussis NOT DETECTED NOT DETECTED Final   Chlamydophila pneumoniae NOT DETECTED NOT DETECTED Final   Mycoplasma pneumoniae NOT DETECTED NOT DETECTED Final      Radiology Studies: No results found.   Scheduled Meds: . apixaban  2.5 mg Oral BID  . chlorhexidine  15 mL Mouth Rinse BID  . donepezil  5 mg Oral QHS  . insulin aspart  0-5 Units Subcutaneous QHS  . insulin aspart  0-9 Units Subcutaneous Q4H  . mouth rinse  15 mL Mouth Rinse q12n4p  . memantine  28 mg Oral Daily  . mirtazapine  15 mg Oral QHS  . PARoxetine  40 mg Oral Daily   Continuous Infusions: . ceFEPime (MAXIPIME) IV Stopped (12/30/16 0420)  . dextrose 5 % and 0.45 % NaCl with KCl 20 mEq/L 100 mL/hr at 12/30/16 0859  . vancomycin Stopped (12/29/16 1647)     LOS: 3 days    Time spent: > 35 min  Velvet Bathe, MD [facility] Pager [phone]  If 7PM-7AM, please contact night-coverage [email or web address] Password [email or web address] 12/30/2016, 12:02 PM

## 2016-12-30 NOTE — Progress Notes (Signed)
[facility]  Received request from PMT NP Charlynn Court to follow up with family re possibility of [employer] transfer. Chart reviewed and met with spouse and daughter at bedside. At their request will follow up again tomorrow when patient's son can be present. Appreciate report from PMT NP Charlynn Court.   Thank you,  [name], LCSW [phone]

## 2016-12-31 LAB — GLUCOSE, CAPILLARY
GLUCOSE-CAPILLARY: 78 mg/dL (ref 65–99)
GLUCOSE-CAPILLARY: 108 mg/dL — AB (ref 65–99)
GLUCOSE-CAPILLARY: 153 mg/dL — AB (ref 65–99)
GLUCOSE-CAPILLARY: 169 mg/dL — AB (ref 65–99)
Glucose-Capillary: 114 mg/dL — ABNORMAL HIGH (ref 65–99)
Glucose-Capillary: 127 mg/dL — ABNORMAL HIGH (ref 65–99)

## 2016-12-31 MED ORDER — METOPROLOL TARTRATE 5 MG/5ML IV SOLN
2.5000 mg | Freq: Once | INTRAVENOUS | Status: AC
  Administered 2016-12-31: 2.5 mg via INTRAVENOUS

## 2016-12-31 MED ORDER — CEFUROXIME AXETIL 500 MG PO TABS
500.0000 mg | ORAL_TABLET | Freq: Two times a day (BID) | ORAL | Status: DC
  Administered 2016-12-31 – 2017-01-01 (×3): 500 mg via ORAL
  Filled 2016-12-31 (×4): qty 1

## 2016-12-31 MED ORDER — METOPROLOL TARTRATE 5 MG/5ML IV SOLN
INTRAVENOUS | Status: AC
  Filled 2016-12-31: qty 5

## 2016-12-31 MED ORDER — SODIUM CHLORIDE 0.9 % IV BOLUS (SEPSIS)
250.0000 mL | Freq: Once | INTRAVENOUS | Status: AC
  Administered 2016-12-31: 250 mL via INTRAVENOUS

## 2016-12-31 MED ORDER — METOPROLOL TARTRATE 12.5 MG HALF TABLET: 12.5000 mg | ORAL_TABLET | Freq: Once | ORAL | Status: DC

## 2016-12-31 NOTE — Progress Notes (Signed)
PROGRESS NOTE    Brittney Powell  YIR:[id number] DOB: 13-Nov-1927 DOA: 12/27/2016 PCP: [name], MD    Brief Narrative:   83 y.o. female with medical history significant of which fibrillation, breast cancer, dementia, depression, diabetes, hypertension, hyperlipidemia, suppresses, TIA, recent hip fracture. Patient has had a gradual mental decline ever since breaking her hip and being placed in nursing home/rehabilitation facility. Patient is cared to regularly by her husband who states he is having increasing difficulty with feeding her.    Assessment & Plan:   Sepsis ([place]) - cefepime and vancomycin on board.  - suspected to be respiratory and/or urinary tract. - Urine culture growing proteus mirabilis sensitivities reviewed. Will d/c vancomycin - blood culture Still negative but pending - Palliative on board and patient may transition to residential hospice.  Active Problems:   HTN (hypertension) - Stable currently.    Atrial fibrillation ([place]) - anticoagulated with Eliquis - elevated HR this am as such will administer B blocker lopressor 2.5 mg x 1    Dementia without behavioral disturbance - stable continue namenda and remeron    DM2 (diabetes mellitus, type 2) ([place]) - currently on MIVF's, will continue SSI q 4 hours    Severe protein-calorie malnutrition ([place]) - Will continue ensure    Acute lower UTI - Pt is on cefepime and vancomycin - will f/u with urine culture.    Aspiration pneumonia ([facility]) - cefepime and vancomycin on board.  - speech therapist consulted.    Acute on chronic alteration in mental status   Electrolyte abnormality    Dysphagia - Speech therapy order placed. For eval. - Palliative consulted per HPI  DVT prophylaxis: Eliquis Code Status: DNR Family Communication: None at bedside Disposition Plan: Pending improvement in condition. Palliative on board and meeting ensuing.   Consultants:   Palliative Care   Procedures:  None   Antimicrobials: Cefepime   Subjective:  Nursing reports that patient had elevated HR this am.  Objective: Vitals:   12/30/16 1919 12/30/16 2300 12/31/16 0342 12/31/16 0749  BP: 103/63 (!) 127/93 119/89   Pulse: (!) 54 (!) 119 (!) 102   Resp: 13 16 18    Temp: 98.8 F (37.1 C) 98.4 F (36.9 C) 98.8 F (37.1 C) 98.8 F (37.1 C)  TempSrc: Axillary Axillary Axillary Axillary  SpO2: 98% 100% 100%   Weight:      Height:        Intake/Output Summary (Last 24 hours) at 12/31/2016 1106 Last data filed at 12/31/2016 0800 Gross per 24 hour  Intake 1760 ml  Output 1350 ml  Net 410 ml   Filed Weights   12/27/16 1322 12/27/16 2318  Weight: 41.3 kg (91 lb) 39.9 kg (88 lb)    Examination: exam unchanged when compared to 12/30/16  General exam: Appears calm and comfortable, in nad. Respiratory system: no audible wheezes, Equal chest rise. Cardiovascular system: S1 & S2 heard,  No JVD, murmurs, rubs, gallops or clicks. No pedal edema Gastrointestinal system: Abdomen is nondistended, soft and nontender. No organomegaly or masses felt. Normal bowel sounds heard. Central nervous system: gazes in my general direction and smiles at me, no facial asymmetry, difficult exam due to AMS Extremities: warm and dry Skin: No rashes, lesions or ulcers, on limited exam. Psychiatry:  Unable to assess due to AMS  Data Reviewed: I have personally reviewed following labs and imaging studies  CBC: Recent Labs  Lab 12/27/16 1224 12/27/16 1446 12/28/16 0635  WBC 30.5*  --  20.5*  NEUTROABS  7.6  --   --   HGB 16.7* 11.9* 11.4*  HCT 52.8* 35.0* 38.4  MCV 103.1*  --  107.9*  PLT 329  --  831   Basic Metabolic Panel: Recent Labs  Lab 12/27/16 1224 12/27/16 1446 12/28/16 0635 12/28/16 1328  NA 158* 159* 153* 150*  K 4.0 3.7 3.8 3.5  CL 119* 122* 121* 120*  CO2 29  --  27 24  GLUCOSE 151* 231* 176* 159*  BUN 47* 44* 28* 29*  CREATININE 0.93 0.70 0.64 0.54  CALCIUM 9.8  --  8.2*  8.2*   GFR: Estimated Creatinine Clearance: 31.2 mL/min (by C-G formula based on SCr of 0.54 mg/dL). Liver Function Tests: Recent Labs  Lab 12/27/16 1224  AST 184*  ALT 130*  ALKPHOS 155*  BILITOT 1.2  PROT 7.4  ALBUMIN 3.9   No results for input(s): LIPASE, AMYLASE in the last 168 hours. No results for input(s): AMMONIA in the last 168 hours. Coagulation Profile: Recent Labs  Lab 12/27/16 1224 12/27/16 1724  INR 1.71 2.02   Cardiac Enzymes: No results for input(s): CKTOTAL, CKMB, CKMBINDEX, TROPONINI in the last 168 hours. BNP (last 3 results) No results for input(s): PROBNP in the last 8760 hours. HbA1C: No results for input(s): HGBA1C in the last 72 hours. CBG: Recent Labs  Lab 12/30/16 1625 12/30/16 1922 12/30/16 2324 12/31/16 0346 12/31/16 0748  GLUCAP 111* 157* 158* 78 108*   Lipid Profile: No results for input(s): CHOL, HDL, LDLCALC, TRIG, CHOLHDL, LDLDIRECT in the last 72 hours. Thyroid Function Tests: No results for input(s): TSH, T4TOTAL, FREET4, T3FREE, THYROIDAB in the last 72 hours. Anemia Panel: No results for input(s): VITAMINB12, FOLATE, FERRITIN, TIBC, IRON, RETICCTPCT in the last 72 hours. Sepsis Labs: Recent Labs  Lab 12/27/16 1236 12/27/16 1447 12/27/16 1724 12/27/16 2024  PROCALCITON  --   --  0.11  --   LATICACIDVEN 3.82* 3.15* 2.2* 1.2    Recent Results (from the past 240 hour(s))  Culture, blood (Routine x 2)     Status: None (Preliminary result)   Collection Time: 12/27/16 12:30 PM  Result Value Ref Range Status   Specimen Description BLOOD BLOOD LEFT FOREARM  Final   Special Requests   Final    BOTTLES DRAWN AEROBIC AND ANAEROBIC Blood Culture adequate volume   Culture NO GROWTH 4 DAYS  Final   Report Status PENDING  Incomplete  Culture, blood (Routine x 2)     Status: None (Preliminary result)   Collection Time: 12/27/16  1:05 PM  Result Value Ref Range Status   Specimen Description BLOOD RIGHT FOREARM  Final   Special  Requests   Final    BOTTLES DRAWN AEROBIC AND ANAEROBIC Blood Culture adequate volume   Culture NO GROWTH 4 DAYS  Final   Report Status PENDING  Incomplete  Urine culture     Status: Abnormal (Preliminary result)   Collection Time: 12/27/16  1:22 PM  Result Value Ref Range Status   Specimen Description URINE, CATHETERIZED  Final   Special Requests NONE  Final   Culture (A)  Final    >=100,000 COLONIES/mL PROTEUS MIRABILIS CULTURE REINCUBATED FOR BETTER GROWTH    Report Status PENDING  Incomplete   Organism ID, Bacteria PROTEUS MIRABILIS (A)  Final      Susceptibility   Proteus mirabilis - MIC*    AMPICILLIN <=2 SENSITIVE Sensitive     CEFAZOLIN <=4 SENSITIVE Sensitive     CEFTRIAXONE <=1 SENSITIVE Sensitive  CIPROFLOXACIN >=4 RESISTANT Resistant     GENTAMICIN <=1 SENSITIVE Sensitive     IMIPENEM 1 SENSITIVE Sensitive     NITROFURANTOIN >=512 RESISTANT Resistant     TRIMETH/SULFA <=20 SENSITIVE Sensitive     AMPICILLIN/SULBACTAM <=2 SENSITIVE Sensitive     PIP/TAZO <=4 SENSITIVE Sensitive     * >=100,000 COLONIES/mL PROTEUS MIRABILIS  MRSA PCR Screening     Status: None   Collection Time: 12/27/16 11:10 PM  Result Value Ref Range Status   MRSA by PCR NEGATIVE NEGATIVE Final    Comment:        The GeneXpert MRSA Assay (FDA approved for NASAL specimens only), is one component of a comprehensive MRSA colonization surveillance program. It is not intended to diagnose MRSA infection nor to guide or monitor treatment for MRSA infections.   Respiratory Panel by PCR     Status: None   Collection Time: 12/28/16  4:19 AM  Result Value Ref Range Status   Adenovirus NOT DETECTED NOT DETECTED Final   Coronavirus 229E NOT DETECTED NOT DETECTED Final   Coronavirus HKU1 NOT DETECTED NOT DETECTED Final   Coronavirus NL63 NOT DETECTED NOT DETECTED Final   Coronavirus OC43 NOT DETECTED NOT DETECTED Final   Metapneumovirus NOT DETECTED NOT DETECTED Final   Rhinovirus / Enterovirus  NOT DETECTED NOT DETECTED Final   Influenza A NOT DETECTED NOT DETECTED Final   Influenza B NOT DETECTED NOT DETECTED Final   Parainfluenza Virus 1 NOT DETECTED NOT DETECTED Final   Parainfluenza Virus 2 NOT DETECTED NOT DETECTED Final   Parainfluenza Virus 3 NOT DETECTED NOT DETECTED Final   Parainfluenza Virus 4 NOT DETECTED NOT DETECTED Final   Respiratory Syncytial Virus NOT DETECTED NOT DETECTED Final   Bordetella pertussis NOT DETECTED NOT DETECTED Final   Chlamydophila pneumoniae NOT DETECTED NOT DETECTED Final   Mycoplasma pneumoniae NOT DETECTED NOT DETECTED Final      Radiology Studies: No results found.   Scheduled Meds: . apixaban  2.5 mg Oral BID  . chlorhexidine  15 mL Mouth Rinse BID  . donepezil  5 mg Oral QHS  . insulin aspart  0-5 Units Subcutaneous QHS  . insulin aspart  0-9 Units Subcutaneous Q4H  . mouth rinse  15 mL Mouth Rinse q12n4p  . memantine  28 mg Oral Daily  . metoprolol tartrate      . mirtazapine  15 mg Oral QHS  . PARoxetine  40 mg Oral Daily   Continuous Infusions: . ceFEPime (MAXIPIME) IV Stopped (12/31/16 0229)  . dextrose 5 % and 0.45 % NaCl with KCl 20 mEq/L 100 mL/hr at 12/31/16 0630     LOS: 4 days    Time spent: > 36 min  Velvet Bathe, MD [facility] Pager [phone]  If 7PM-7AM, please contact night-coverage [email or web address] Password [email or web address] 12/31/2016, 11:06 AM

## 2016-12-31 NOTE — Plan of Care (Signed)
Although pt cannot care for self, family has been very supportive and wants to learn. Spouse and children willing and able to help care for pt. HR controlled with lopressor; BP maintained at baseline for pt; pt denies pain but does get uncomfortable & is repositioned/provided peri & oral care.   Brittney  Rogan Wigley, RN

## 2016-12-31 NOTE — Progress Notes (Signed)
Daily Progress Note   Patient Name: Brittney Powell       Date: 12/31/2016 DOB: [birth year]  Age: 83 y.o. MRN#: [id number] Attending Physician: Velvet Bathe, MD Primary Care Physician: [name], MD Admit Date: 12/27/2016  Reason for Consultation/Follow-up: Establishing goals of care, Non pain symptom management and Psychosocial/spiritual support  Subjective: Brittney Powell is relatively unchanged from yesterday. She continues to be rather lethargic, and did have a period of acute anxiety and restlessness today. Negligible oral intake.   Length of Stay: 4  Current Medications: Scheduled Meds:  . apixaban  2.5 mg Oral BID  . cefUROXime  500 mg Oral BID WC  . chlorhexidine  15 mL Mouth Rinse BID  . donepezil  5 mg Oral QHS  . insulin aspart  0-5 Units Subcutaneous QHS  . insulin aspart  0-9 Units Subcutaneous Q4H  . mouth rinse  15 mL Mouth Rinse q12n4p  . memantine  28 mg Oral Daily  . metoprolol tartrate      . mirtazapine  15 mg Oral QHS  . PARoxetine  40 mg Oral Daily    Continuous Infusions: . dextrose 5 % and 0.45 % NaCl with KCl 20 mEq/L 100 mL/hr at 12/31/16 0630    PRN Meds: acetaminophen **OR** acetaminophen, ALPRAZolam, ondansetron **OR** ondansetron (ZOFRAN) IV, polyethylene glycol, senna-docusate  Physical Exam     Constitutional: She appears alert. She has a sickly appearance. No distress.  Cachectic, frail woman. Lethargic. HENT:  Head: Normocephalic and atraumatic.  Mouth/Throat: Mucous membranes are dry (clamps mouth closed when oral care attempted).  Eyes: EOM are normal.  Neck: Normal range of motion. Neck supple.  Cardiovascular: Tachycardia present.  Pulmonary/Chest: Effort normal. She has decreased breath sounds in the right lower field and the left lower field. She has rhonchi.  Abdominal: Soft. Bowel  sounds are decreased.  Musculoskeletal:  Frail and weak  Neurological: She appears alert.  More sleepy today. When woken she had a period of acute anxiety/restlessness. Skin: Skin is warm and dry. There is pallor.  Psychiatric:  Calm   Vital Signs: BP 119/89 (BP Location: Left Arm)   Pulse (!) 102   Temp 98.5 F (36.9 C) (Axillary)   Resp 18   Ht 5\' 2"  (1.575 m)   Wt 39.9 kg (88 lb)   SpO2 100%   BMI 16.10 kg/m  SpO2: SpO2: 100 % O2 Device: O2 Device: Nasal Cannula O2 Flow Rate: O2 Flow Rate (L/min): 2 L/min  Intake/output summary:   Intake/Output Summary (Last 24 hours) at 12/31/2016 1559 Last data filed at 12/31/2016 0800 Gross per 24 hour  Intake 1360 ml  Output 1350 ml  Net 10 ml   LBM: Last BM Date: 12/31/16 Baseline Weight: Weight: 41.3 kg (91 lb) Most recent weight: Weight: 39.9 kg (88 lb)  Palliative Assessment/Data: PPS 20%    Patient Active Problem List   Diagnosis Date Noted  . Pressure injury of skin 12/28/2016  . Dehydration   . Goals of care, counseling/discussion   . Palliative care by specialist   . Sepsis ([place]) 12/27/2016  . Acute lower UTI 12/27/2016  . Aspiration pneumonia ([place]) 12/27/2016  . Acute on chronic alteration in mental status 12/27/2016  .  Electrolyte abnormality 12/27/2016  . Dysphagia 12/27/2016  . Severe protein-calorie malnutrition ([place]) 06/08/2016  . Recurrent major depressive disorder, in partial remission ([place]) 06/08/2016  . Dementia without behavioral disturbance 07/22/2015  . Protein-calorie malnutrition ([place]) 07/22/2015  . DM2 (diabetes mellitus, type 2) ([place]) 07/22/2015  . Hyperlipidemia 07/22/2015  . Controlled type 2 diabetes mellitus without complication, without long-term current use of insulin ([place]) 03/03/2015  . Atrial fibrillation ([place]) 01/17/2015  . Anxiety state 09/09/2014  . Goiter diffuse   . Displaced intertrochanteric fracture of left femur ([place]) 08/25/2014  . Dementia 08/25/2014  . Diabetes  mellitus type II, uncontrolled ([place]) 08/25/2014  . Fall 08/25/2014  . Atrial fibrillation with RVR (new onset) 08/25/2014  . HTN (hypertension) 08/25/2014  . Elevated d-dimer 08/25/2014  . Left displaced femoral neck fracture ([place]) 08/25/2014  . Goiter 08/25/2014  . Memory difficulty 05/15/2013  . Abnormality of gait 05/15/2013  . Loss of weight 05/15/2013    Palliative Care Assessment & Plan   HPI: 83 y.o. female  with past medical history of dementia (full care, resides at [facility]), recent hip fracture, TIA, A. Fib on Eliquis, depression, DM2, HTN, and HLD. She presented with fatigue and decreased appetite. On exam she was found to be febrile with elevated WBC and lactic acid, and was in A. Fib with RVR. She was admitted on 12/27/2016. She is currently being treated for sepsis, etiology unclear but may be UTI vs. PNA. CXR clear, however she has known aspiration risk and ongoing episodes of overt aspiration. Per ED note, pt's husband confirms DNR status and that she would not want a feeding tube. Palliative consulted to support pt and family and help clarify goals of care with the potential for EOL care depending on clinical course.   Assessment: I had another long conversation with the patient's family today: husband, daughter, son, and daughter-in-law. I arrived after the Hospice liaison had evaluated, and Brittney Powell was felt to be residential hospice appropriate (though no beds today). Unfortunately, the family is having significant problems agreeing on the right path forward. Mr. Napolitano is very clear that his wife would want a full comfort approach, however their daughter is pushing very hard for ongoing aggressive care. He feels conflicted because of their disagreement. We talked at length again about the differences between aggressive and comfort focused care, as well as the location this care can occur. Mr. Bushong asked to think on it more tonight, as well as have time to discuss it  further with his family.   Recommendations/Plan:  DNR  Internal family disagreement about plan. Pt's husband (her decision maker) asked to think about it more tonight and we will re-meet at 10am tomorrow. I will reach out to [facility] and SW once goals and plan clarified  PLEASE SPEAK WITH ME WHEN DISCHARGE IS IMMINENT, THIS FAMILY IS VERY AFRAID SHE WILL BE "PUSHED OUT" AND NEED REASSURANCE ABOUT THE D/C PLAN  Continue comfort feeds, family acknowledges risk  Code Status:  DNR  Prognosis:   < 2 weeks in the setting of advanced dementia, minimal intake, severe aspiration risk, and high likelihood of recurrent infections.   Discharge Planning:  To Be Determined  Care plan was discussed with pt's husband, son and daughter, and daughter-in-law  Thank you for allowing the Palliative Medicine Team to assist in the care of this patient.  Total time: 50 minutes    Greater than 50%  of this time was spent counseling and coordinating care related to the  above assessment and plan.  Charlynn Court, NP Palliative Medicine Team [phone] pager (7a-5p) Team Phone # [phone]

## 2016-12-31 NOTE — Progress Notes (Signed)
Pharmacy Antibiotic Note  Brittney Powell is a 83 y.o. female on day # 2 Vancomycin and Cefepime for sepsis and HCAP coverage.  Temp and WBC trended down.  Low body weight, ~40 kg, creatinine is normal.  Creatinine clearance ~30-35 ml/ml.  No new labs since 11/8.  Likely transitioning to hospice soon.  Urine culture growing proteus, resistant to cipro and nitrofurantoin, but otherwise sensitive.  Patient with penicillin allergy (unknown reaction).  Plan: Spoke with Dr. Wendee Beavers - will narrow antibiotics to po cefuroxime. Cefuroxime 500 mg q 12 hrs. Pharmacy will sign-off.  Height: 5\' 2"  (157.5 cm) Weight: 88 lb (39.9 kg) IBW/kg (Calculated) : 50.1  Temp (24hrs), Avg:98.6 F (37 C), Min:98 F (36.7 C), Max:98.8 F (37.1 C)  Recent Labs  Lab 12/27/16 1224 12/27/16 1236 12/27/16 1446 12/27/16 1447 12/27/16 1724 12/27/16 2024 12/28/16 0635 12/28/16 1328  WBC 30.5*  --   --   --   --   --  20.5*  --   CREATININE 0.93  --  0.70  --   --   --  0.64 0.54  LATICACIDVEN  --  3.82*  --  3.15* 2.2* 1.2  --   --     Estimated Creatinine Clearance: 31.2 mL/min (by C-G formula based on SCr of 0.54 mg/dL).    Allergies  Allergen Reactions  . Penicillins Other (See Comments)    Unsure of reaction    Antimicrobials this admission:   Vancomycin 11/7>>11/10   Cefepime 11/7>>11/11   Cefuroxime 11/11 >  Dose adjustments this admission:  11/8: maintenance doses decreased per above due to low body weight and crcl ~30-35  Microbiology results:   11/7 blood x 2 - ngtd   11/7 urine - proteus M (> 100K) - resistant to cipro + NF; sensitve to all else   11/7 MRSA PCR negative   11/7 Strep pneumo Ag negative   11/7 Legionella Ag - in process   11/7 HIV - in process   11/8 respiratory panel - neg  Thank you for allowing pharmacy to be a part of this patient's care.  Uvaldo Rising, BCPS  Clinical Pharmacist Pager [phone]  12/31/2016 11:30 AM

## 2017-01-01 DIAGNOSIS — E870 Hyperosmolality and hypernatremia: Secondary | ICD-10-CM

## 2017-01-01 LAB — CULTURE, BLOOD (ROUTINE X 2)
Culture: NO GROWTH
Culture: NO GROWTH
SPECIAL REQUESTS: ADEQUATE
Special Requests: ADEQUATE

## 2017-01-01 LAB — GLUCOSE, CAPILLARY
GLUCOSE-CAPILLARY: 152 mg/dL — AB (ref 65–99)
Glucose-Capillary: 77 mg/dL (ref 65–99)
Glucose-Capillary: 149 mg/dL — ABNORMAL HIGH (ref 65–99)

## 2017-01-01 LAB — URINE CULTURE: Culture: >=100000 — AB

## 2017-01-01 MED ORDER — CEFUROXIME AXETIL 500 MG PO TABS: 500.0000 mg | ORAL_TABLET | Freq: Two times a day (BID) | ORAL | 0 refills | Status: AC

## 2017-01-01 MED ORDER — SODIUM CHLORIDE 0.9 % IV BOLUS (SEPSIS)
250.0000 mL | Freq: Once | INTRAVENOUS | Status: AC
  Administered 2017-01-01: 250 mL via INTRAVENOUS

## 2017-01-01 MED ORDER — METOPROLOL TARTRATE 5 MG/5ML IV SOLN
2.5000 mg | Freq: Once | INTRAVENOUS | Status: AC | PRN
  Administered 2017-01-01: 2.5 mg via INTRAVENOUS
  Filled 2017-01-01: qty 5

## 2017-01-01 NOTE — Progress Notes (Addendum)
Daily Progress Note   Patient Name: Brittney Powell       Date: 01/01/2017 DOB: [birth year]  Age: 83 y.o. MRN#: [id number] Attending Physician: Velvet Bathe, MD Primary Care Physician: [name], MD Admit Date: 12/27/2016  Reason for Consultation/Follow-up: Establishing goals of care, Non pain symptom management and Psychosocial/spiritual support  Subjective: Mrs. Bednarski is relatively unchanged from yesterday. She has been declining oral intake and remains very lethargic. She has required fluid boluses for blood pressure maintenance and remains on IV fluids.   Length of Stay: 5  Current Medications: Scheduled Meds:  . apixaban  2.5 mg Oral BID  . cefUROXime  500 mg Oral BID WC  . chlorhexidine  15 mL Mouth Rinse BID  . donepezil  5 mg Oral QHS  . insulin aspart  0-5 Units Subcutaneous QHS  . insulin aspart  0-9 Units Subcutaneous Q4H  . mouth rinse  15 mL Mouth Rinse q12n4p  . memantine  28 mg Oral Daily  . mirtazapine  15 mg Oral QHS  . PARoxetine  40 mg Oral Daily    Continuous Infusions: . dextrose 5 % and 0.45 % NaCl with KCl 20 mEq/L 100 mL/hr at 01/01/17 0635    PRN Meds: acetaminophen **OR** acetaminophen, ALPRAZolam, ondansetron **OR** ondansetron (ZOFRAN) IV, polyethylene glycol, senna-docusate  Physical Exam     Constitutional: She appears alert. She has a sickly appearance. No distress.  Cachectic, frail woman. Lethargic  HENT:  Head: Normocephalic and atraumatic.  Mouth/Throat: Mucous membranes are dry (clamps mouth closed when oral care attempted).  Eyes: EOM are normal.  Neck: Normal range of motion. Neck supple.  Cardiovascular: Tachycardia present.  Pulmonary/Chest:  Regular rate. Shallow breaths. Musculoskeletal:  Frail and weak  Neurological:  Lethargic. Minimally interactive today. Skin: Skin is  warm and dry. There is pallor.  Psychiatric:  Calm   Vital Signs: BP 125/83   Pulse (!) 137   Temp (!) 97.5 F (36.4 C) (Oral)   Resp 15   Ht '5\' 2"'$  (1.575 m)   Wt 39.9 kg (88 lb)   SpO2 100%   BMI 16.10 kg/m  SpO2: SpO2: 100 % O2 Device: O2 Device: Nasal Cannula O2 Flow Rate: O2 Flow Rate (L/min): 2 L/min  Intake/output summary:   Intake/Output Summary (Last 24 hours) at 01/01/2017 1020 Last data filed at 01/01/2017 5638 Gross per 24 hour  Intake 15 ml  Output 475 ml  Net -460 ml   LBM: Last BM Date: 12/31/16 Baseline Weight: Weight: 41.3 kg (91 lb) Most recent weight: Weight: 39.9 kg (88 lb)  Palliative Assessment/Data: PPS 10%    Patient Active Problem List   Diagnosis Date Noted  . Pressure injury of skin 12/28/2016  . Dehydration   . Goals of care, counseling/discussion   . Palliative care by specialist   . Sepsis ([place]) 12/27/2016  . Acute lower UTI 12/27/2016  . Aspiration pneumonia ([place]) 12/27/2016  . Acute on chronic alteration in mental status 12/27/2016  . Electrolyte abnormality 12/27/2016  . Dysphagia 12/27/2016  . Severe protein-calorie malnutrition ([place]) 06/08/2016  . Recurrent major depressive disorder, in partial remission ([place]) 06/08/2016  . Dementia without behavioral disturbance 07/22/2015  . Protein-calorie malnutrition ([place]) 07/22/2015  .  DM2 (diabetes mellitus, type 2) ([place]) 07/22/2015  . Hyperlipidemia 07/22/2015  . Controlled type 2 diabetes mellitus without complication, without long-term current use of insulin ([place]) 03/03/2015  . Atrial fibrillation ([place]) 01/17/2015  . Anxiety state 09/09/2014  . Goiter diffuse   . Displaced intertrochanteric fracture of left femur ([place]) 08/25/2014  . Dementia 08/25/2014  . Diabetes mellitus type II, uncontrolled ([place]) 08/25/2014  . Fall 08/25/2014  . Atrial fibrillation with RVR (new onset) 08/25/2014  . HTN (hypertension) 08/25/2014  . Elevated d-dimer 08/25/2014  . Left displaced femoral  neck fracture ([place]) 08/25/2014  . Goiter 08/25/2014  . Memory difficulty 05/15/2013  . Abnormality of gait 05/15/2013  . Loss of weight 05/15/2013    Palliative Care Assessment & Plan   HPI: 83 y.o. female  with past medical history of dementia (full care, resides at [facility]), recent hip fracture, TIA, A. Fib on Eliquis, depression, DM2, HTN, and HLD. She presented with fatigue and decreased appetite. On exam she was found to be febrile with elevated WBC and lactic acid, and was in A. Fib with RVR. She was admitted on 12/27/2016. She is currently being treated for sepsis, etiology unclear but may be UTI vs. PNA. CXR clear, however she has known aspiration risk and ongoing episodes of overt aspiration. Per ED note, pt's husband confirms DNR status and that she would not want a feeding tube. Palliative consulted to support pt and family and help clarify goals of care with the potential for EOL care depending on clinical course.   Assessment: I met with Mr. Greenhouse and his son and daughter-in-law at the bedside. NP Elicia Lamp was also present. Mr. Worthington had thought about our conversations over the past few days and he made the decision this morning to transition to full comfort measures and transfer to residential hospice. We talked about the changes that would occur, namely stopping IV fluids, not checking labs or vitals, and treating symptoms. He was agreeable to these changes. We talked about transfer to [facility] once a bed is available, which I suspect will be today or tomorrow.   Recommendations/Plan:  DNR, plan to transition to residential hospice once a bed is available at [facility].   Message left for SW updating on plan  I would leave everything in place while here (IV fluids, anticoagulation, antibiotics, blood sugar checks, insulin, cardiac monitoring, etc.) while here as husband struggling with big changes. Once a bed is available at [facility], she can transition to full comfort measures  and all of these measures can be stopped at discharge.   Code Status:  DNR  Prognosis:   < 2 weeks in the setting of advanced dementia, no oral intake, severe aspiration risk, and high likelihood of recurrent infections.   Discharge Planning:  Hospice facility  Care plan was discussed with pt's husband, son and daughter, and daughter-in-law  Thank you for allowing the Palliative Medicine Team to assist in the care of this patient.  Total time: 15 minutes    Greater than 50%  of this time was spent counseling and coordinating care related to the above assessment and plan.  Charlynn Court, NP Palliative Medicine Team [phone] pager (7a-5p) Team Phone # [phone]

## 2017-01-01 NOTE — Consult Note (Signed)
[facility] room available this morning for Mrs. Brittney Powell. Completed paper work for transfer today with spouse and son at bedside. Dr. Tomasa Hosteller to assume care per family request.   Please send discharge summary to [phone].  RN please call report to [phone].  Thank you,  [name], LCSW [phone]

## 2017-01-01 NOTE — Clinical Social Work Note (Signed)
CSW facilitated patient discharge including contacting patient family (spouse at bedside) and facility to confirm patient discharge plans. Clinical information faxed to facility and family agreeable with plan. CSW arranged ambulance transport via [facility] to [facility]. RN to call report prior to discharge [phone]).  CSW will sign off for now as social work intervention is no longer needed. Please consult [place] again if new needs arise.  Dayton Scrape, [place]

## 2017-01-01 NOTE — Discharge Summary (Signed)
Physician Discharge Summary  Brittney Powell PNT:[id number] DOB: 07/10/1927 DOA: 12/27/2016  PCP: [name], MD  Admit date: 12/27/2016 Discharge date: 01/01/2017  Time spent: > 35 minutes  Recommendations for Outpatient Follow-up:  1. Continue to ensure comfort measures   Discharge Diagnoses:  Active Problems:   HTN (hypertension)   Atrial fibrillation (HCC)   Dementia without behavioral disturbance   DM2 (diabetes mellitus, type 2) (HCC)   Severe protein-calorie malnutrition (HCC)   Sepsis ([place])   Acute lower UTI   Aspiration pneumonia (HCC)   Acute on chronic alteration in mental status   Electrolyte abnormality   Dysphagia   Pressure injury of skin   Dehydration   Goals of care, counseling/discussion   Palliative care by specialist   Discharge Condition: Stable  Diet recommendation: Diabetic/heart healthy  Filed Weights   12/27/16 1322 12/27/16 2318  Weight: 41.3 kg (91 lb) 39.9 kg (88 lb)    History of present illness:  83 y.o.femalewith medical history significant ofwhich fibrillation, breast cancer, dementia, depression, diabetes, hypertension, hyperlipidemia, suppresses, TIA, recent hip fracture. Patient has had a gradual mental decline ever since breaking her hip and being placed in nursing home/rehabilitation facility. Patient is cared to regularly by her husband who states he is having increasing difficulty with feeding her.     Hospital Course:  Failure to thrive -Most likely secondary to advancing dementia  Atrial fibrillation with RVR - Comfort care measures to continue to be employed at residential hospice  Urinary tract infection - We'll continue to more days of antibiotics to complete a seven-day treatment course  Dementia - Continue prior to admission medications regimen unless otherwise decided by the palliative care team  Procedures:  None  Consultations:  Palliative care  Discharge Exam: Vitals:   01/01/17 0800 01/01/17  0940  BP: (!) 89/52 125/83  Pulse: (!) 110 (!) 137  Resp: 15 15  Temp:    SpO2: 100% 100%    General: Pt in nad, alert and awake Cardiovascular: no cyanosis Respiratory: no increased wob, no wheezes  Discharge Instructions   Discharge Instructions    Call MD for:  persistant nausea and vomiting   Complete by:  As directed    Call MD for:  redness, tenderness, or signs of infection (pain, swelling, redness, odor or green/yellow discharge around incision site)   Complete by:  As directed    Diet - low sodium heart healthy   Complete by:  As directed    Increase activity slowly   Complete by:  As directed      Current Discharge Medication List    START taking these medications   Details  cefUROXime (CEFTIN) 500 MG tablet Take 1 tablet (500 mg total) 2 (two) times daily with a meal by mouth. Qty: 4 tablet, Refills: 0      CONTINUE these medications which have NOT CHANGED   Details  acetaminophen (TYLENOL) 325 MG tablet Take 650 mg by mouth every 6 (six) hours as needed for moderate pain.    ALPRAZolam (XANAX) 0.25 MG tablet Take 1 tablet (0.25 mg total) by mouth at bedtime. Qty: 30 tablet    apixaban (ELIQUIS) 2.5 MG TABS tablet Take 1 tablet (2.5 mg total) by mouth 2 (two) times daily. Qty: 60 tablet, Refills: 60    diltiazem (CARDIZEM CD) 240 MG 24 hr capsule Take 1 capsule (240 mg total) by mouth daily.    donepezil (ARICEPT) 5 MG tablet Take 1 tablet (5 mg total) by mouth at bedtime.  Qty: 30 tablet, Refills: 0    insulin glargine (LANTUS) 100 UNIT/ML injection Inject 8 Units into the skin at bedtime.    insulin lispro (HUMALOG) 100 UNIT/ML injection Sliding scale dosing per CBG results BID 201-250=3 units, 251-350= 5 units, >351=call MD    memantine (NAMENDA XR) 28 MG CP24 24 hr capsule Take 1 capsule (28 mg total) by mouth daily. Qty: 90 capsule, Refills: 1    mirtazapine (REMERON) 15 MG tablet Take 15 mg at bedtime by mouth.     Multiple Vitamin  (MULTIVITAMIN) tablet Take 1 tablet by mouth daily.    ondansetron (ZOFRAN) 4 MG tablet Take 1 tablet (4 mg total) by mouth every 8 (eight) hours as needed for nausea or vomiting. Qty: 20 tablet, Refills: 0    PARoxetine (PAXIL) 40 MG tablet Take 40 mg by mouth daily. For depression    polyethylene glycol (MIRALAX / GLYCOLAX) packet Take 17 g by mouth daily. Hold for loose stools    senna-docusate (SENNA PLUS) 8.6-50 MG tablet Take 1 tablet by mouth 2 (two) times daily.    NUTRITIONAL SUPPLEMENT LIQD Take 120 mLs by mouth 3 (three) times daily. MedPass due to weight loss      STOP taking these medications     ezetimibe (ZETIA) 10 MG tablet      loratadine (CLARITIN) 10 MG tablet      Docusate Sodium (COLACE PO)        Allergies  Allergen Reactions  . Penicillins Other (See Comments)    Unsure of reaction      The results of significant diagnostics from this hospitalization (including imaging, microbiology, ancillary and laboratory) are listed below for reference.    Significant Diagnostic Studies: Dg Chest Port 1 View  Result Date: 12/27/2016 CLINICAL DATA:  Altered mental status EXAM: PORTABLE CHEST 1 VIEW COMPARISON:  11/29/2016 FINDINGS: Cardiac shadow is stable. Aortic calcifications are again noted. 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 Elevation of left hemidiaphragm is again seen. No focal infiltrate or sizable effusion is noted. Tiny calcified granuloma is noted laterally on the right. No bony abnormality is seen. IMPRESSION: Chronic changes without acute abnormality. Electronically Signed   By: Inez Catalina M.D.   On: 12/27/2016 14:14    Microbiology: Recent Results (from the past 240 hour(s))  Culture, blood (Routine x 2)     Status: None (Preliminary result)   Collection Time: 12/27/16 12:30 PM  Result Value Ref Range Status   Specimen Description BLOOD BLOOD LEFT FOREARM  Final   Special Requests   Final    BOTTLES DRAWN  AEROBIC AND ANAEROBIC Blood Culture adequate volume   Culture NO GROWTH 4 DAYS  Final   Report Status PENDING  Incomplete  Culture, blood (Routine x 2)     Status: None (Preliminary result)   Collection Time: 12/27/16  1:05 PM  Result Value Ref Range Status   Specimen Description BLOOD RIGHT FOREARM  Final   Special Requests   Final    BOTTLES DRAWN AEROBIC AND ANAEROBIC Blood Culture adequate volume   Culture NO GROWTH 4 DAYS  Final   Report Status PENDING  Incomplete  Urine culture     Status: Abnormal   Collection Time: 12/27/16  1:22 PM  Result Value Ref Range Status   Specimen Description URINE, CATHETERIZED  Final   Special Requests NONE  Final   Culture (A)  Final    >=100,000 COLONIES/mL PROTEUS MIRABILIS >=100,000 COLONIES/mL GEMELLA SPECIES Standardized susceptibility testing  for this organism is not available.    Report Status 01/01/2017 FINAL  Final   Organism ID, Bacteria PROTEUS MIRABILIS (A)  Final      Susceptibility   Proteus mirabilis - MIC*    AMPICILLIN <=2 SENSITIVE Sensitive     CEFAZOLIN <=4 SENSITIVE Sensitive     CEFTRIAXONE <=1 SENSITIVE Sensitive     CIPROFLOXACIN >=4 RESISTANT Resistant     GENTAMICIN <=1 SENSITIVE Sensitive     IMIPENEM 1 SENSITIVE Sensitive     NITROFURANTOIN >=512 RESISTANT Resistant     TRIMETH/SULFA <=20 SENSITIVE Sensitive     AMPICILLIN/SULBACTAM <=2 SENSITIVE Sensitive     PIP/TAZO <=4 SENSITIVE Sensitive     * >=100,000 COLONIES/mL PROTEUS MIRABILIS  MRSA PCR Screening     Status: None   Collection Time: 12/27/16 11:10 PM  Result Value Ref Range Status   MRSA by PCR NEGATIVE NEGATIVE Final    Comment:        The GeneXpert MRSA Assay (FDA approved for NASAL specimens only), is one component of a comprehensive MRSA colonization surveillance program. It is not intended to diagnose MRSA infection nor to guide or monitor treatment for MRSA infections.   Respiratory Panel by PCR     Status: None   Collection Time:  12/28/16  4:19 AM  Result Value Ref Range Status   Adenovirus NOT DETECTED NOT DETECTED Final   Coronavirus 229E NOT DETECTED NOT DETECTED Final   Coronavirus HKU1 NOT DETECTED NOT DETECTED Final   Coronavirus NL63 NOT DETECTED NOT DETECTED Final   Coronavirus OC43 NOT DETECTED NOT DETECTED Final   Metapneumovirus NOT DETECTED NOT DETECTED Final   Rhinovirus / Enterovirus NOT DETECTED NOT DETECTED Final   Influenza A NOT DETECTED NOT DETECTED Final   Influenza B NOT DETECTED NOT DETECTED Final   Parainfluenza Virus 1 NOT DETECTED NOT DETECTED Final   Parainfluenza Virus 2 NOT DETECTED NOT DETECTED Final   Parainfluenza Virus 3 NOT DETECTED NOT DETECTED Final   Parainfluenza Virus 4 NOT DETECTED NOT DETECTED Final   Respiratory Syncytial Virus NOT DETECTED NOT DETECTED Final   Bordetella pertussis NOT DETECTED NOT DETECTED Final   Chlamydophila pneumoniae NOT DETECTED NOT DETECTED Final   Mycoplasma pneumoniae NOT DETECTED NOT DETECTED Final     Labs: Basic Metabolic Panel: Recent Labs  Lab 12/27/16 1224 12/27/16 1446 12/28/16 0635 12/28/16 1328  NA 158* 159* 153* 150*  K 4.0 3.7 3.8 3.5  CL 119* 122* 121* 120*  CO2 29  --  27 24  GLUCOSE 151* 231* 176* 159*  BUN 47* 44* 28* 29*  CREATININE 0.93 0.70 0.64 0.54  CALCIUM 9.8  --  8.2* 8.2*   Liver Function Tests: Recent Labs  Lab 12/27/16 1224  AST 184*  ALT 130*  ALKPHOS 155*  BILITOT 1.2  PROT 7.4  ALBUMIN 3.9   No results for input(s): LIPASE, AMYLASE in the last 168 hours. No results for input(s): AMMONIA in the last 168 hours. CBC: Recent Labs  Lab 12/27/16 1224 12/27/16 1446 12/28/16 0635  WBC 30.5*  --  20.5*  NEUTROABS 7.6  --   --   HGB 16.7* 11.9* 11.4*  HCT 52.8* 35.0* 38.4  MCV 103.1*  --  107.9*  PLT 329  --  226   Cardiac Enzymes: No results for input(s): CKTOTAL, CKMB, CKMBINDEX, TROPONINI in the last 168 hours. BNP: BNP (last 3 results) No results for input(s): BNP in the last 8760  hours.  ProBNP (last 3  results) No results for input(s): PROBNP in the last 8760 hours.  CBG: Recent Labs  Lab 12/31/16 1958 12/31/16 2323 01/01/17 0306 01/01/17 0755 01/01/17 1201  GLUCAP 127* 169* 77 149* 152*    Signed:  Velvet Bathe MD.  [facility] 01/01/2017, 12:06 PM

## 2017-01-01 NOTE — Clinical Social Work Note (Signed)
Clinical Social Work Assessment  Patient Details  Name: Brittney Powell MRN: [id number] Date of Birth: 01/27/1928  Date of referral:  01/01/17               Reason for consult:  Facility Placement, End of Life/Hospice                Permission sought to share information with:  Facility Contact Representative, Family Supports Permission granted to share information::  Yes, Verbal Permission Granted  Name::     [name] Filsinger  Agency::  [facility]  Relationship::  Husband  Contact Information:  [phone]  Housing/Transportation Living arrangements for the past 2 months:  Skilled Nursing Facility Source of Information:  Medical Team, Adult Children, Spouse Patient Interpreter Needed:  None Criminal Activity/Legal Involvement Pertinent to Current Situation/Hospitalization:  No - Comment as needed Significant Relationships:  Adult Children, Spouse Lives with:  Facility Resident Do you feel safe going back to the place where you live?  Yes Need for family participation in patient care:  Yes (Comment)  Care giving concerns:  Patient is a long-term resident at [facility]. Plan is for discharge to hospice facility.   Social Worker assessment / plan:  CSW received call from admissions coordinator at [facility] stating that they have a bed available today. CSW met with patient's husband, son, and daughter-in-law at bedside to confirm plan. CSW paged MD to notify that DNR is on the front of her chart to be signed. No further concerns. CSW encouraged patient's family to contact CSW as needed. CSW will continue to follow patient and her family for support and facilitate discharge to [facility] today.  Employment status:  Retired Insurance information:  Managed Medicare PT Recommendations:  Not assessed at this time Information / Referral to community resources:  Other (Comment Required)(Residential Hospice Facility)  Patient/Family's Response to care:  Patient's family agreeable to  discharge to [facility]. Patient's family supportive and involved in patient's care. Patient's family appreciated social work intervention.  Patient/Family's Understanding of and Emotional Response to Diagnosis, Current Treatment, and Prognosis:  Patient's family has a good understanding of the reason for admission and prognosis. Patient's family appears happy with hospital care.  Emotional Assessment Appearance:  Appears stated age Attitude/Demeanor/Rapport:  Unable to Assess Affect (typically observed):  Unable to Assess Orientation:  Oriented to Self Alcohol / Substance use:  Never Used Psych involvement (Current and /or in the community):  No (Comment)  Discharge Needs  Concerns to be addressed:  Care Coordination Readmission within the last 30 days:  No Current discharge risk:  Cognitively Impaired, Terminally ill Barriers to Discharge:  No Barriers Identified    C , LCSW 01/01/2017, 10:38 AM  

## 2017-01-01 NOTE — Discharge Instructions (Signed)

## 2017-01-01 NOTE — Plan of Care (Signed)
Pt with advanced dementia

## 2017-01-01 NOTE — Progress Notes (Signed)
PROGRESS NOTE    Brittney Powell  XNT:[id number] DOB: Dec 13, 1927 DOA: 12/27/2016 PCP: [name], MD    Brief Narrative:   83 y.o. female with medical history significant of which fibrillation, breast cancer, dementia, depression, diabetes, hypertension, hyperlipidemia, suppresses, TIA, recent hip fracture. Patient has had a gradual mental decline ever since breaking her hip and being placed in nursing home/rehabilitation facility. Patient is cared to regularly by her husband who states he is having increasing difficulty with feeding her.   Assessment & Plan:   Sepsis ([place]) - cefepime and vancomycin on board.  - suspected to be respiratory and/or urinary tract. - Urine culture growing proteus mirabilis sensitivities reviewed. Will d/c vancomycin - blood culture Still negative but pending - Palliative on board and plan is to transition to residential hospice.  Active Problems:   HTN (hypertension) - Stable currently.    Atrial fibrillation ([place]) - anticoagulated with Eliquis - elevated HR this am as such will administer B blocker lopressor 2.5 mg x 1    Dementia without behavioral disturbance - stable continue namenda and remeron    DM2 (diabetes mellitus, type 2) ([place]) - currently on MIVF's, will continue SSI q 4 hours    Severe protein-calorie malnutrition ([place]) - Will continue ensure    Acute lower UTI - Pt is on cefepime and vancomycin - will f/u with urine culture.    Aspiration pneumonia (HCC) - cefepime and vancomycin on board.  - speech therapist consulted.    Acute on chronic alteration in mental status   Electrolyte abnormality    Dysphagia - Speech therapy order placed. For eval. - Palliative consulted per HPI  DVT prophylaxis: Eliquis Code Status: DNR Family Communication: None at bedside Disposition Plan: Awaiting residential hospice bed availability   Consultants:   Palliative Care   Procedures: None   Antimicrobials: Cefepime  >>>Cefuroxime  Subjective:  Pt has no new complaints.  Objective: Vitals:   01/01/17 0658 01/01/17 0700 01/01/17 0800 01/01/17 0940  BP: 92/68 92/68 (!) 89/52 125/83  Pulse: (!) 107 (!) 128 (!) 110 (!) 137  Resp: 15 15 15 15   Temp: (!) 97.5 F (36.4 C)     TempSrc: Oral     SpO2: 99% 98% 100% 100%  Weight:      Height:        Intake/Output Summary (Last 24 hours) at 01/01/2017 1033 Last data filed at 01/01/2017 0307 Gross per 24 hour  Intake 15 ml  Output 475 ml  Net -460 ml   Filed Weights   12/27/16 1322 12/27/16 2318  Weight: 41.3 kg (91 lb) 39.9 kg (88 lb)    Examination: exam unchanged when compared to 12/31/16  General exam: Appears calm and comfortable, in nad. Respiratory system: no audible wheezes, Equal chest rise. Cardiovascular system: S1 & S2 heard,  No JVD, murmurs, rubs, gallops or clicks. No pedal edema Gastrointestinal system: Abdomen is nondistended, soft and nontender. No organomegaly or masses felt. Normal bowel sounds heard. Central nervous system: gazes in my general direction and smiles at me, no facial asymmetry, difficult exam due to AMS Extremities: warm and dry Skin: No rashes, lesions or ulcers, on limited exam. Psychiatry:  Unable to assess due to AMS  Data Reviewed: I have personally reviewed following labs and imaging studies  CBC: Recent Labs  Lab 12/27/16 1224 12/27/16 1446 12/28/16 0635  WBC 30.5*  --  20.5*  NEUTROABS 7.6  --   --   HGB 16.7* 11.9* 11.4*  HCT 52.8* 35.0*  38.4  MCV 103.1*  --  107.9*  PLT 329  --  027   Basic Metabolic Panel: Recent Labs  Lab 12/27/16 1224 12/27/16 1446 12/28/16 0635 12/28/16 1328  NA 158* 159* 153* 150*  K 4.0 3.7 3.8 3.5  CL 119* 122* 121* 120*  CO2 29  --  27 24  GLUCOSE 151* 231* 176* 159*  BUN 47* 44* 28* 29*  CREATININE 0.93 0.70 0.64 0.54  CALCIUM 9.8  --  8.2* 8.2*   GFR: Estimated Creatinine Clearance: 31.2 mL/min (by C-G formula based on SCr of 0.54 mg/dL). Liver  Function Tests: Recent Labs  Lab 12/27/16 1224  AST 184*  ALT 130*  ALKPHOS 155*  BILITOT 1.2  PROT 7.4  ALBUMIN 3.9   No results for input(s): LIPASE, AMYLASE in the last 168 hours. No results for input(s): AMMONIA in the last 168 hours. Coagulation Profile: Recent Labs  Lab 12/27/16 1224 12/27/16 1724  INR 1.71 2.02   Cardiac Enzymes: No results for input(s): CKTOTAL, CKMB, CKMBINDEX, TROPONINI in the last 168 hours. BNP (last 3 results) No results for input(s): PROBNP in the last 8760 hours. HbA1C: No results for input(s): HGBA1C in the last 72 hours. CBG: Recent Labs  Lab 12/31/16 1544 12/31/16 1958 12/31/16 2323 01/01/17 0306 01/01/17 0755  GLUCAP 114* 127* 169* 77 149*   Lipid Profile: No results for input(s): CHOL, HDL, LDLCALC, TRIG, CHOLHDL, LDLDIRECT in the last 72 hours. Thyroid Function Tests: No results for input(s): TSH, T4TOTAL, FREET4, T3FREE, THYROIDAB in the last 72 hours. Anemia Panel: No results for input(s): VITAMINB12, FOLATE, FERRITIN, TIBC, IRON, RETICCTPCT in the last 72 hours. Sepsis Labs: Recent Labs  Lab 12/27/16 1236 12/27/16 1447 12/27/16 1724 12/27/16 2024  PROCALCITON  --   --  0.11  --   LATICACIDVEN 3.82* 3.15* 2.2* 1.2    Recent Results (from the past 240 hour(s))  Culture, blood (Routine x 2)     Status: None (Preliminary result)   Collection Time: 12/27/16 12:30 PM  Result Value Ref Range Status   Specimen Description BLOOD BLOOD LEFT FOREARM  Final   Special Requests   Final    BOTTLES DRAWN AEROBIC AND ANAEROBIC Blood Culture adequate volume   Culture NO GROWTH 4 DAYS  Final   Report Status PENDING  Incomplete  Culture, blood (Routine x 2)     Status: None (Preliminary result)   Collection Time: 12/27/16  1:05 PM  Result Value Ref Range Status   Specimen Description BLOOD RIGHT FOREARM  Final   Special Requests   Final    BOTTLES DRAWN AEROBIC AND ANAEROBIC Blood Culture adequate volume   Culture NO GROWTH 4  DAYS  Final   Report Status PENDING  Incomplete  Urine culture     Status: Abnormal   Collection Time: 12/27/16  1:22 PM  Result Value Ref Range Status   Specimen Description URINE, CATHETERIZED  Final   Special Requests NONE  Final   Culture (A)  Final    >=100,000 COLONIES/mL PROTEUS MIRABILIS >=100,000 COLONIES/mL GEMELLA SPECIES Standardized susceptibility testing for this organism is not available.    Report Status 01/01/2017 FINAL  Final   Organism ID, Bacteria PROTEUS MIRABILIS (A)  Final      Susceptibility   Proteus mirabilis - MIC*    AMPICILLIN <=2 SENSITIVE Sensitive     CEFAZOLIN <=4 SENSITIVE Sensitive     CEFTRIAXONE <=1 SENSITIVE Sensitive     CIPROFLOXACIN >=4 RESISTANT Resistant  GENTAMICIN <=1 SENSITIVE Sensitive     IMIPENEM 1 SENSITIVE Sensitive     NITROFURANTOIN >=512 RESISTANT Resistant     TRIMETH/SULFA <=20 SENSITIVE Sensitive     AMPICILLIN/SULBACTAM <=2 SENSITIVE Sensitive     PIP/TAZO <=4 SENSITIVE Sensitive     * >=100,000 COLONIES/mL PROTEUS MIRABILIS  MRSA PCR Screening     Status: None   Collection Time: 12/27/16 11:10 PM  Result Value Ref Range Status   MRSA by PCR NEGATIVE NEGATIVE Final    Comment:        The GeneXpert MRSA Assay (FDA approved for NASAL specimens only), is one component of a comprehensive MRSA colonization surveillance program. It is not intended to diagnose MRSA infection nor to guide or monitor treatment for MRSA infections.   Respiratory Panel by PCR     Status: None   Collection Time: 12/28/16  4:19 AM  Result Value Ref Range Status   Adenovirus NOT DETECTED NOT DETECTED Final   Coronavirus 229E NOT DETECTED NOT DETECTED Final   Coronavirus HKU1 NOT DETECTED NOT DETECTED Final   Coronavirus NL63 NOT DETECTED NOT DETECTED Final   Coronavirus OC43 NOT DETECTED NOT DETECTED Final   Metapneumovirus NOT DETECTED NOT DETECTED Final   Rhinovirus / Enterovirus NOT DETECTED NOT DETECTED Final   Influenza A NOT  DETECTED NOT DETECTED Final   Influenza B NOT DETECTED NOT DETECTED Final   Parainfluenza Virus 1 NOT DETECTED NOT DETECTED Final   Parainfluenza Virus 2 NOT DETECTED NOT DETECTED Final   Parainfluenza Virus 3 NOT DETECTED NOT DETECTED Final   Parainfluenza Virus 4 NOT DETECTED NOT DETECTED Final   Respiratory Syncytial Virus NOT DETECTED NOT DETECTED Final   Bordetella pertussis NOT DETECTED NOT DETECTED Final   Chlamydophila pneumoniae NOT DETECTED NOT DETECTED Final   Mycoplasma pneumoniae NOT DETECTED NOT DETECTED Final      Radiology Studies: No results found.   Scheduled Meds: . apixaban  2.5 mg Oral BID  . cefUROXime  500 mg Oral BID WC  . chlorhexidine  15 mL Mouth Rinse BID  . donepezil  5 mg Oral QHS  . insulin aspart  0-5 Units Subcutaneous QHS  . insulin aspart  0-9 Units Subcutaneous Q4H  . mouth rinse  15 mL Mouth Rinse q12n4p  . memantine  28 mg Oral Daily  . mirtazapine  15 mg Oral QHS  . PARoxetine  40 mg Oral Daily   Continuous Infusions: . dextrose 5 % and 0.45 % NaCl with KCl 20 mEq/L 100 mL/hr at 01/01/17 0635     LOS: 5 days    Time spent: > 40 min  Velvet Bathe, MD [facility] Pager [phone]  If 7PM-7AM, please contact night-coverage [email or web address] Password [facility] 01/01/2017, 10:33 AM

## 2017-01-01 NOTE — Progress Notes (Signed)
Report called to sylvia at [facility] at this time.

## 2017-01-20 DEATH — deceased

## 2017-04-23 ENCOUNTER — Telehealth: Payer: Self-pay | Admitting: Adult Health

## 2017-04-23 NOTE — Telephone Encounter (Signed)
Pt's husband called to c/a appt in April. Said he saw the appt on the calendar for her. He said it has been hard, he was a little teary-eyed. He said she passed 02/15/2018.  FYI

## 2017-06-12 ENCOUNTER — Ambulatory Visit: Payer: Medicare Other | Admitting: Neurology

## 2017-11-02 IMAGING — CT CT CHEST W/O CM
1 of 2 series · 15 of 32 positions shown, 19 images · non-contrast
Comparison: Chest CT - 08/25/2014

CLINICAL DATA: Abnormal chest radiograph. History of breast cancer
and radical mastectomy.

EXAM:
CT CHEST WITHOUT CONTRAST
TECHNIQUE: Multidetector CT imaging of the chest was performed following the
standard protocol without IV contrast.

[Series 5: super d · axial · 0.56mm/px · z∈[-378,-124]mm · 15 of 406 slices shown, 19 images]
[im 21/406  mediastinal]
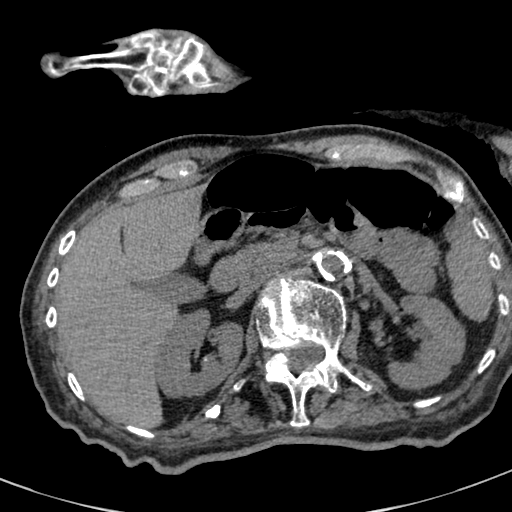
[im 21/406  lung]
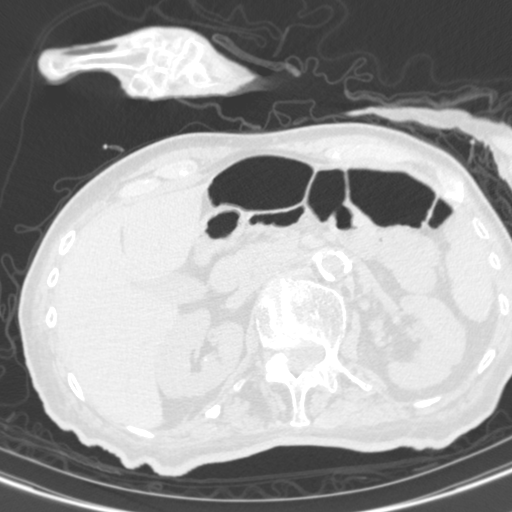
[im 61/406  lung]
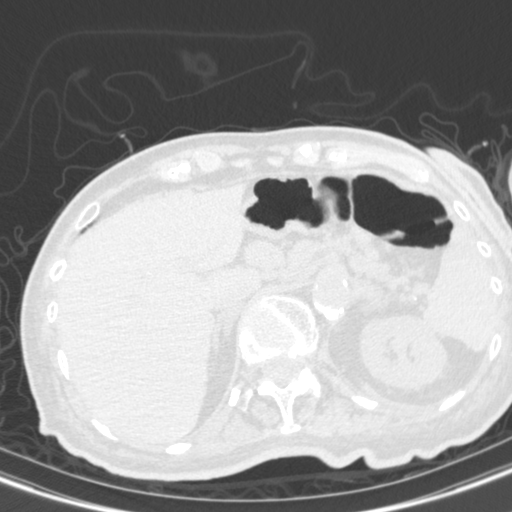
[im 82/406  lung]
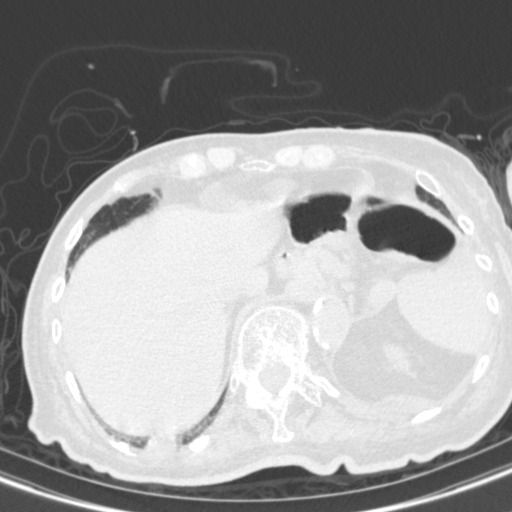
[im 122/406  lung]
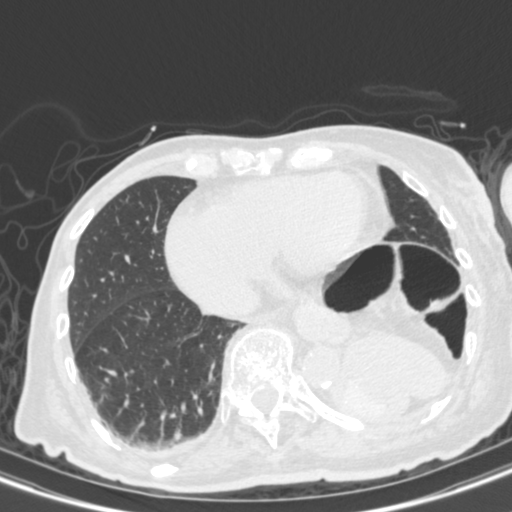
[im 136/406  mediastinal]
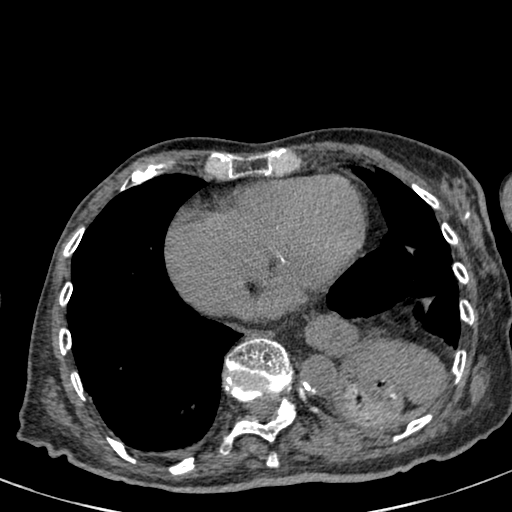
[im 136/406  lung]
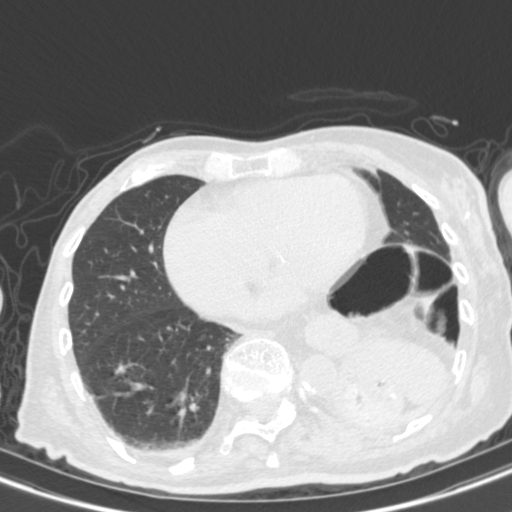
[im 163/406  lung]
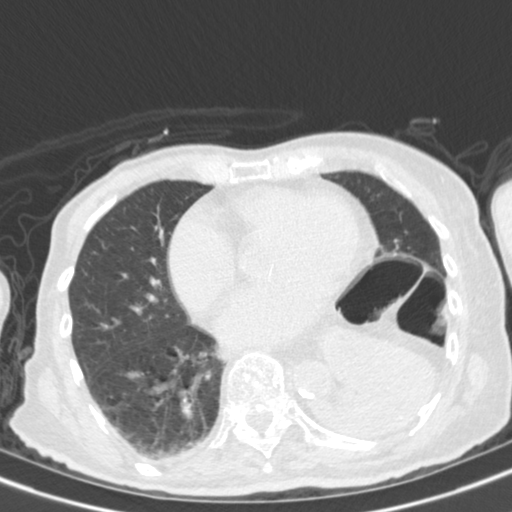
[im 183/406  lung]
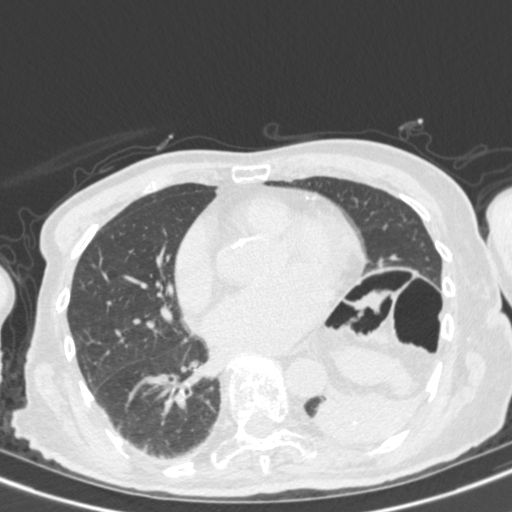
[im 203/406  lung]
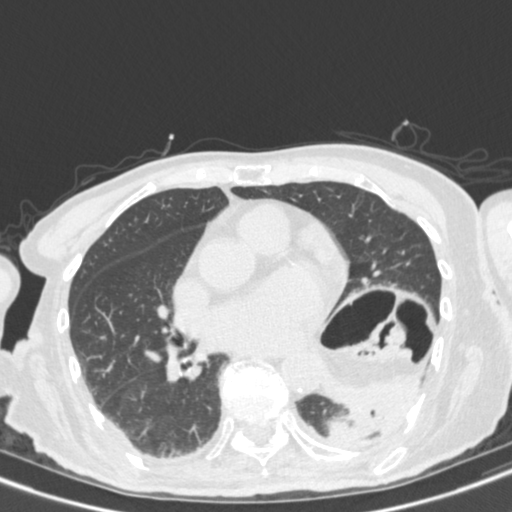
[im 223/406  mediastinal]
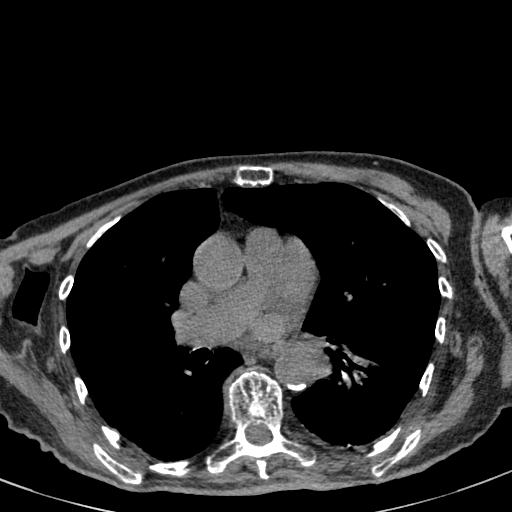
[im 223/406  lung]
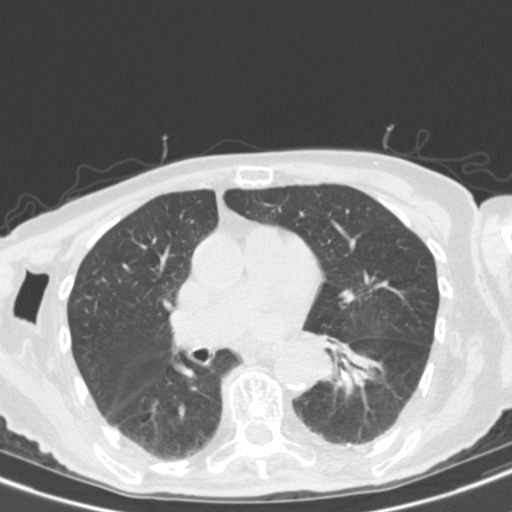
[im 244/406  lung]
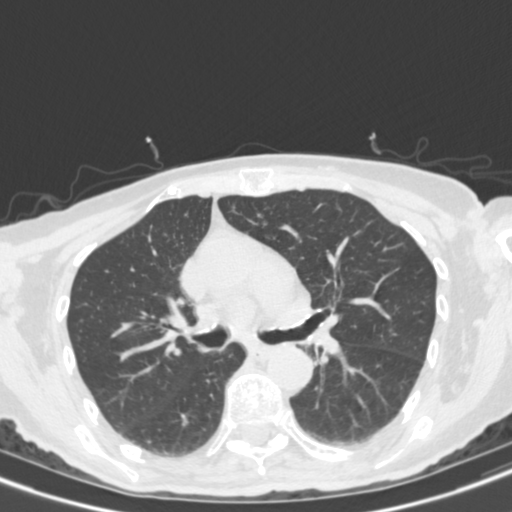
[im 271/406  lung]
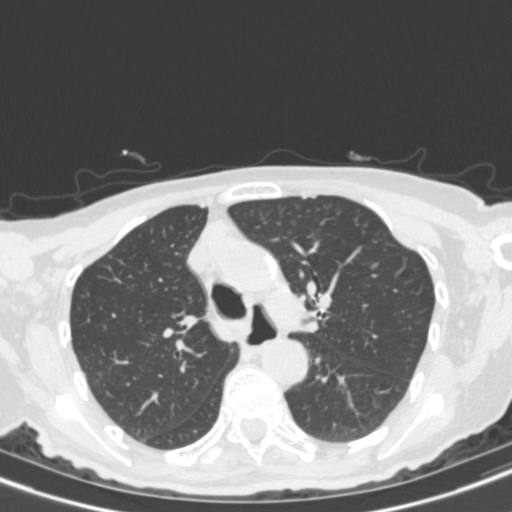
[im 284/406  lung]
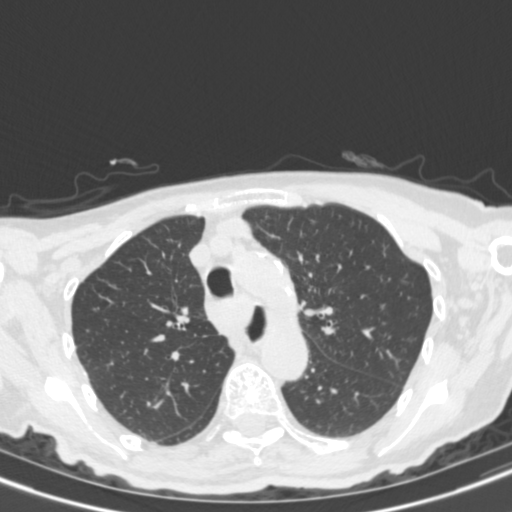
[im 325/406  mediastinal]
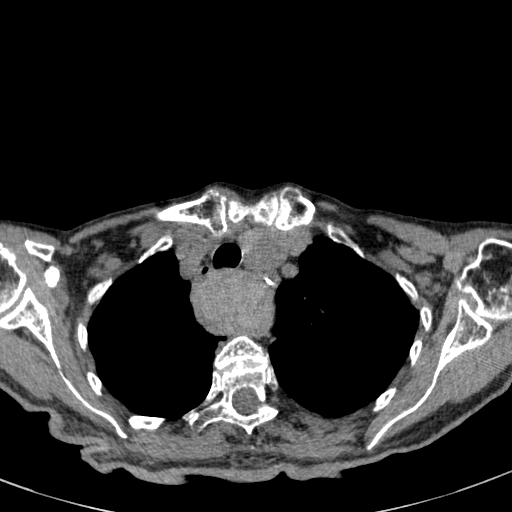
[im 325/406  lung]
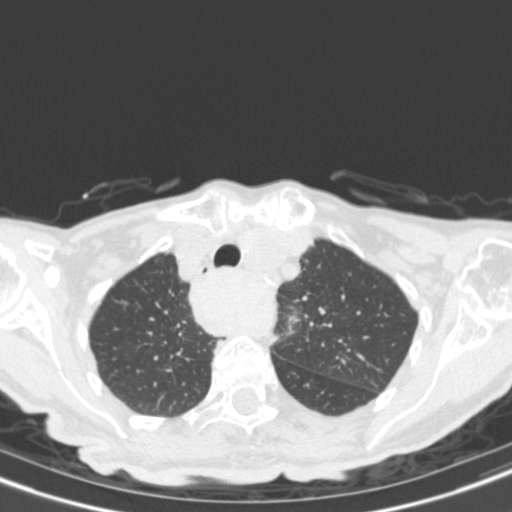
[im 345/406  lung]
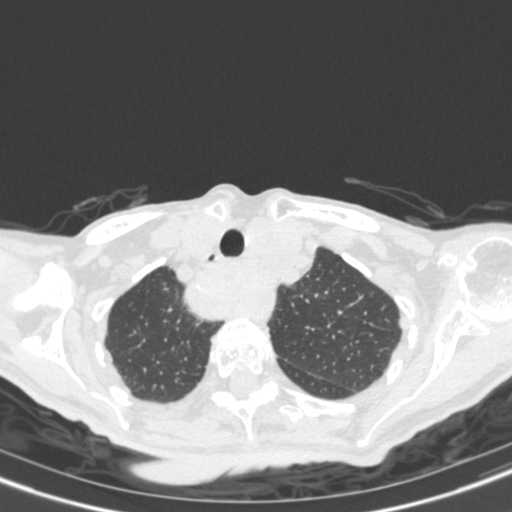
[im 385/406  lung]
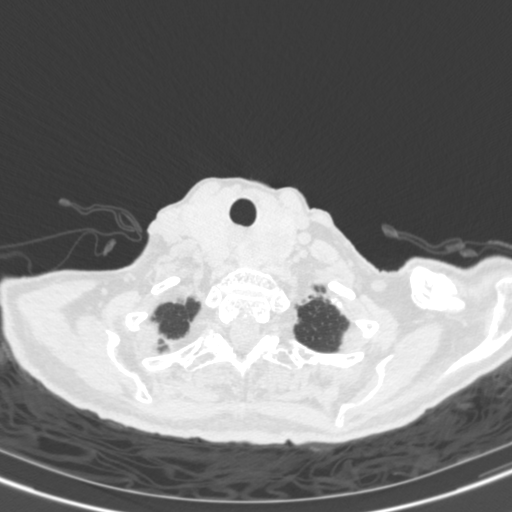

[15 of 32 positions shown; findings below may reference images not displayed]

FINDINGS: Cardiovascular: Cardiomegaly. Coronary calcifications.
Atherosclerotic plaque throughout the thoracic aorta.

Mediastinum/Nodes: Re- demonstrated massive thyroid goiter which
extends caudally posterior to the trachea to the level just cranial
to the carina, similar to the [DATE] examination. While exact
measurements are difficult, the thyroid goiter appears to measure at
least 8.7 by 8.0 x 6.7 cm(image 26, series 6 and image 13, series
3), similar to the [DATE] examination

There is minimal anterior deviation the tracheal air column without
narrowing. There is mass effect of upon the esophagus however this
also was unchanged compared to the [DATE] examination.

No definitive bulky mediastinal, hilar axillary lymphadenopathy on
this noncontrast examination.

Lungs/Pleura: Persistent elevation / eventration of the left
hemidiaphragm with associated partial volume loss / atelectasis of
the left lower lobe. Minimal dependent ground-glass atelectasis
within in the right lower lobe. No focal airspace opacities.

Minimal dependent subpleural ground-glass atelectasis. Unchanged
punctate (approximately 0.3 cm) calcified granuloma within the right
costophrenic angle. No pleural effusion or pneumothorax.

Upper Abdomen: Limited noncontrast evaluation of the upper abdomen
demonstrates atherosclerotic plaque within the abdominal aorta.
Punctate bilateral nonobstructing renal stones.

Musculoskeletal: Mild rotatory, presumably degenerate scoliotic
curvature of the lumbar spine, convex to the left.
IMPRESSION: 1. No acute cardiopulmonary disease.
2. Persistent elevation/eventration of the left hemidiaphragm.
3. Re- demonstrated massive thyroid goiter with substernal
extension, similar to the [DATE] examination.
4. Aortic Atherosclerosis ([id number]).

## 2017-11-30 IMAGING — DX DG CHEST 1V PORT
1 series · 1 of 1 positions shown · non-contrast
Comparison: 11/29/2016

CLINICAL DATA: Altered mental status

EXAM:
PORTABLE CHEST 1 VIEW

[chest]
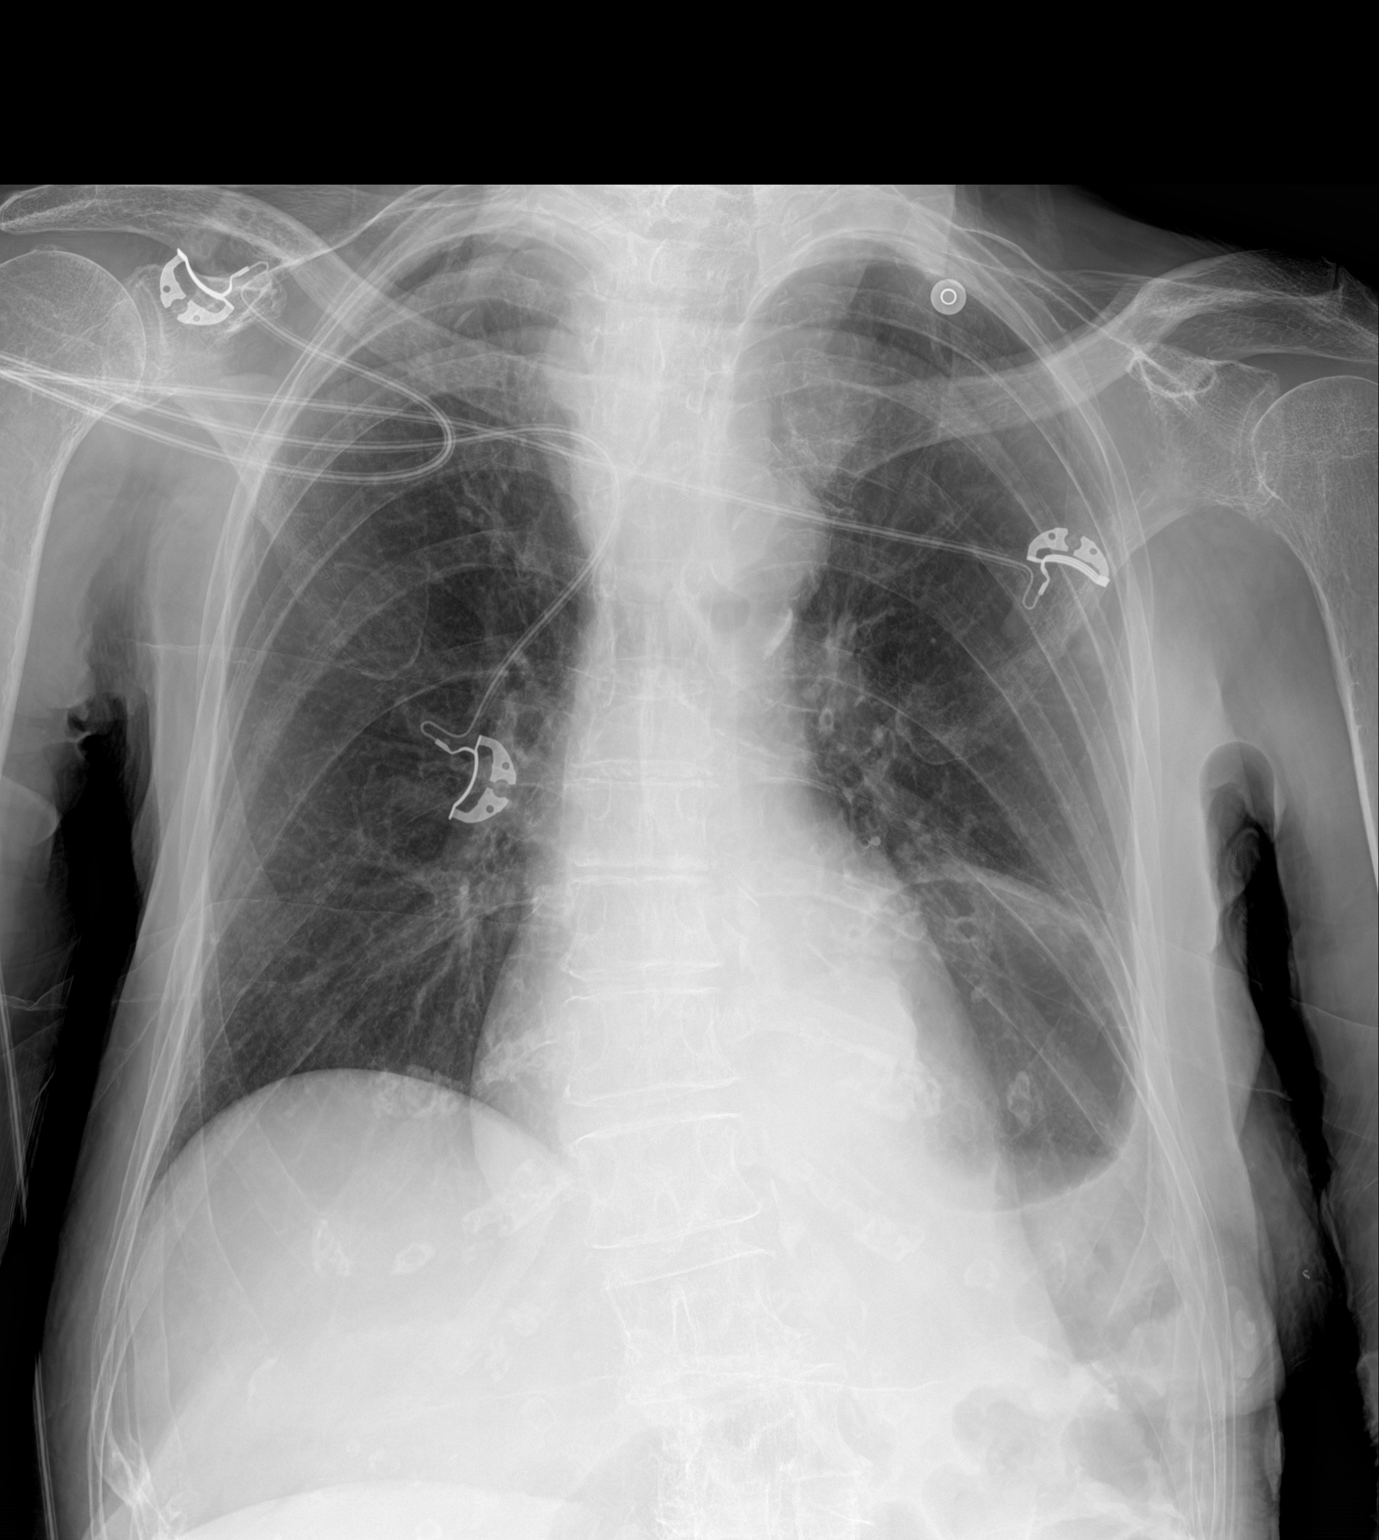

[1 of 1 positions shown; findings below may reference images not displayed]

FINDINGS: Cardiac shadow is stable. Aortic calcifications are again noted.
Prominent intrathoracic goiter is noted in the superior mediastinum.
Elevation of left hemidiaphragm is again seen. No focal infiltrate
or sizable effusion is noted. Tiny calcified granuloma is noted
laterally on the right. No bony abnormality is seen.
IMPRESSION: Chronic changes without acute abnormality.

## 2018-01-20 DEATH — deceased
